# Patient Record
Sex: Female | Born: 1960 | Race: White | Hispanic: No | Smoking: Current every day smoker
Health system: Southern US, Community
[De-identification: ages and names within clinical notes are randomized; demographics above are authoritative.]

## PROBLEM LIST (undated history)

## (undated) DIAGNOSIS — M1990 Unspecified osteoarthritis, unspecified site: Secondary | ICD-10-CM

## (undated) DIAGNOSIS — I2510 Atherosclerotic heart disease of native coronary artery without angina pectoris: Secondary | ICD-10-CM

## (undated) DIAGNOSIS — R519 Headache, unspecified: Secondary | ICD-10-CM

## (undated) DIAGNOSIS — R51 Headache: Secondary | ICD-10-CM

## (undated) DIAGNOSIS — IMO0001 Reserved for inherently not codable concepts without codable children: Secondary | ICD-10-CM

## (undated) DIAGNOSIS — K219 Gastro-esophageal reflux disease without esophagitis: Secondary | ICD-10-CM

## (undated) DIAGNOSIS — I219 Acute myocardial infarction, unspecified: Secondary | ICD-10-CM

## (undated) DIAGNOSIS — F419 Anxiety disorder, unspecified: Secondary | ICD-10-CM

## (undated) DIAGNOSIS — J449 Chronic obstructive pulmonary disease, unspecified: Secondary | ICD-10-CM

## (undated) DIAGNOSIS — F329 Major depressive disorder, single episode, unspecified: Secondary | ICD-10-CM

## (undated) DIAGNOSIS — E785 Hyperlipidemia, unspecified: Secondary | ICD-10-CM

## (undated) DIAGNOSIS — I10 Essential (primary) hypertension: Secondary | ICD-10-CM

## (undated) DIAGNOSIS — G4730 Sleep apnea, unspecified: Secondary | ICD-10-CM

## (undated) DIAGNOSIS — F32A Depression, unspecified: Secondary | ICD-10-CM

## (undated) HISTORY — DX: Chronic obstructive pulmonary disease, unspecified: J44.9

## (undated) HISTORY — DX: Hyperlipidemia, unspecified: E78.5

## (undated) HISTORY — PX: INDUCED ABORTION: SHX677

## (undated) HISTORY — DX: Sleep apnea, unspecified: G47.30

## (undated) HISTORY — PX: TUBAL LIGATION: SHX77

## (undated) HISTORY — PX: CHOLECYSTECTOMY: SHX55

## (undated) HISTORY — PX: MULTIPLE TOOTH EXTRACTIONS: SHX2053

## (undated) HISTORY — DX: Unspecified osteoarthritis, unspecified site: M19.90

---

## 2009-07-20 ENCOUNTER — Emergency Department (HOSPITAL_COMMUNITY): Admission: EM | Admit: 2009-07-20 | Discharge: 2009-07-20 | Payer: Self-pay | Admitting: Emergency Medicine

## 2009-09-21 ENCOUNTER — Observation Stay (HOSPITAL_COMMUNITY): Admission: EM | Admit: 2009-09-21 | Discharge: 2009-09-27 | Payer: Self-pay | Admitting: Emergency Medicine

## 2009-09-21 ENCOUNTER — Encounter (INDEPENDENT_AMBULATORY_CARE_PROVIDER_SITE_OTHER): Payer: Self-pay | Admitting: Emergency Medicine

## 2009-09-24 ENCOUNTER — Encounter (INDEPENDENT_AMBULATORY_CARE_PROVIDER_SITE_OTHER): Payer: Self-pay | Admitting: Cardiology

## 2010-05-27 ENCOUNTER — Emergency Department (HOSPITAL_COMMUNITY)
Admission: EM | Admit: 2010-05-27 | Discharge: 2010-05-27 | Disposition: A | Discharge status: Home / Self Care | Payer: Medicaid Other | Attending: Emergency Medicine | Admitting: Emergency Medicine

## 2010-05-27 DIAGNOSIS — R197 Diarrhea, unspecified: Secondary | ICD-10-CM | POA: Insufficient documentation

## 2010-05-27 DIAGNOSIS — F411 Generalized anxiety disorder: Secondary | ICD-10-CM | POA: Insufficient documentation

## 2010-05-27 DIAGNOSIS — Z79899 Other long term (current) drug therapy: Secondary | ICD-10-CM | POA: Insufficient documentation

## 2010-05-27 DIAGNOSIS — R112 Nausea with vomiting, unspecified: Secondary | ICD-10-CM | POA: Insufficient documentation

## 2010-05-27 DIAGNOSIS — I252 Old myocardial infarction: Secondary | ICD-10-CM | POA: Insufficient documentation

## 2010-05-27 DIAGNOSIS — R5381 Other malaise: Secondary | ICD-10-CM | POA: Insufficient documentation

## 2010-05-27 DIAGNOSIS — R42 Dizziness and giddiness: Secondary | ICD-10-CM | POA: Insufficient documentation

## 2010-05-27 DIAGNOSIS — E860 Dehydration: Secondary | ICD-10-CM | POA: Insufficient documentation

## 2010-05-27 DIAGNOSIS — E789 Disorder of lipoprotein metabolism, unspecified: Secondary | ICD-10-CM | POA: Insufficient documentation

## 2010-05-27 LAB — DIFFERENTIAL
Basophils Absolute: 0.1 10*3/uL (ref 0.0–0.1)
Basophils Relative: 1 % (ref 0–1)
Eosinophils Absolute: 0.2 10*3/uL (ref 0.0–0.7)
Eosinophils Relative: 2 % (ref 0–5)
Lymphocytes Relative: 35 % (ref 12–46)
Lymphs Abs: 3.5 10*3/uL (ref 0.7–4.0)
Monocytes Absolute: 0.6 10*3/uL (ref 0.1–1.0)
Monocytes Relative: 6 % (ref 3–12)
Neutro Abs: 5.9 10*3/uL (ref 1.7–7.7)
Neutrophils Relative %: 58 % (ref 43–77)

## 2010-05-27 LAB — COMPREHENSIVE METABOLIC PANEL
ALT: <8 U/L (ref 0–35)
AST: 14 U/L (ref 0–37)
Albumin: 3.9 g/dL (ref 3.5–5.2)
Alkaline Phosphatase: 72 U/L (ref 39–117)
BUN: 44 mg/dL — ABNORMAL HIGH (ref 6–23)
CO2: 23 mEq/L (ref 19–32)
Calcium: 9.6 mg/dL (ref 8.4–10.5)
Chloride: 107 mEq/L (ref 96–112)
Creatinine, Ser: 1.39 mg/dL — ABNORMAL HIGH (ref 0.4–1.2)
GFR calc Af Amer: 49 mL/min — ABNORMAL LOW (ref >60)
GFR calc non Af Amer: 40 mL/min — ABNORMAL LOW (ref >60)
Glucose, Bld: 74 mg/dL (ref 70–99)
Potassium: 4.5 mEq/L (ref 3.5–5.1)
Sodium: 138 mEq/L (ref 135–145)
Total Bilirubin: 0.8 mg/dL (ref 0.3–1.2)
Total Protein: 7.7 g/dL (ref 6.0–8.3)

## 2010-05-27 LAB — LIPASE, BLOOD: Lipase: 25 U/L (ref 11–59)

## 2010-05-27 LAB — CBC
HCT: 42.7 % (ref 36.0–46.0)
Hemoglobin: 14.7 g/dL (ref 12.0–15.0)
MCH: 30 pg (ref 26.0–34.0)
MCHC: 34.4 g/dL (ref 30.0–36.0)
MCV: 87.1 fL (ref 78.0–100.0)
Platelets: 209 10*3/uL (ref 150–400)
RBC: 4.9 MIL/uL (ref 3.87–5.11)
RDW: 12.4 % (ref 11.5–15.5)
WBC: 10.2 10*3/uL (ref 4.0–10.5)

## 2010-07-08 LAB — DIFFERENTIAL
Basophils Absolute: 0 10*3/uL (ref 0.0–0.1)
Basophils Absolute: 0.1 10*3/uL (ref 0.0–0.1)
Basophils Relative: 1 % (ref 0–1)
Basophils Relative: 1 % (ref 0–1)
Eosinophils Absolute: 0.2 10*3/uL (ref 0.0–0.7)
Eosinophils Absolute: 0.2 10*3/uL (ref 0.0–0.7)
Eosinophils Relative: 3 % (ref 0–5)
Eosinophils Relative: 4 % (ref 0–5)
Lymphocytes Relative: 34 % (ref 12–46)
Lymphocytes Relative: 42 % (ref 12–46)
Lymphs Abs: 1.9 10*3/uL (ref 0.7–4.0)
Lymphs Abs: 3.5 10*3/uL (ref 0.7–4.0)
Monocytes Absolute: 0.4 10*3/uL (ref 0.1–1.0)
Monocytes Absolute: 0.7 10*3/uL (ref 0.1–1.0)
Monocytes Relative: 7 % (ref 3–12)
Monocytes Relative: 8 % (ref 3–12)
Neutro Abs: 3 10*3/uL (ref 1.7–7.7)
Neutro Abs: 3.9 10*3/uL (ref 1.7–7.7)
Neutrophils Relative %: 46 % (ref 43–77)
Neutrophils Relative %: 55 % (ref 43–77)

## 2010-07-08 LAB — URINE DRUGS OF ABUSE SCREEN W ALC, ROUTINE (REF LAB)
Amphetamine Screen, Ur: NEGATIVE
Barbiturate Quant, Ur: NEGATIVE
Benzodiazepines.: NEGATIVE
Cocaine Metabolites: NEGATIVE
Creatinine,U: 212.7 mg/dL
Ethyl Alcohol: <10 mg/dL (ref <10)
Marijuana Metabolite: NEGATIVE
Methadone: NEGATIVE
Opiate Screen, Urine: POSITIVE — AB
Phencyclidine (PCP): NEGATIVE
Propoxyphene: NEGATIVE

## 2010-07-08 LAB — CBC
HCT: 33.8 % — ABNORMAL LOW (ref 36.0–46.0)
HCT: 35.3 % — ABNORMAL LOW (ref 36.0–46.0)
HCT: 35.7 % — ABNORMAL LOW (ref 36.0–46.0)
HCT: 38 % (ref 36.0–46.0)
Hemoglobin: 11.9 g/dL — ABNORMAL LOW (ref 12.0–15.0)
Hemoglobin: 12.2 g/dL (ref 12.0–15.0)
Hemoglobin: 12.3 g/dL (ref 12.0–15.0)
Hemoglobin: 13 g/dL (ref 12.0–15.0)
MCHC: 34.1 g/dL (ref 30.0–36.0)
MCHC: 34.2 g/dL (ref 30.0–36.0)
MCHC: 34.9 g/dL (ref 30.0–36.0)
MCHC: 35.2 g/dL (ref 30.0–36.0)
MCV: 89.7 fL (ref 78.0–100.0)
MCV: 90.8 fL (ref 78.0–100.0)
MCV: 91.1 fL (ref 78.0–100.0)
MCV: 91.2 fL (ref 78.0–100.0)
Platelets: 134 10*3/uL — ABNORMAL LOW (ref 150–400)
Platelets: 137 10*3/uL — ABNORMAL LOW (ref 150–400)
Platelets: 149 10*3/uL — ABNORMAL LOW (ref 150–400)
Platelets: 166 10*3/uL (ref 150–400)
RBC: 3.77 MIL/uL — ABNORMAL LOW (ref 3.87–5.11)
RBC: 3.88 MIL/uL (ref 3.87–5.11)
RBC: 3.91 MIL/uL (ref 3.87–5.11)
RBC: 4.18 MIL/uL (ref 3.87–5.11)
RDW: 13.2 % (ref 11.5–15.5)
RDW: 13.2 % (ref 11.5–15.5)
RDW: 13.3 % (ref 11.5–15.5)
RDW: 13.4 % (ref 11.5–15.5)
WBC: 5.5 10*3/uL (ref 4.0–10.5)
WBC: 6.1 10*3/uL (ref 4.0–10.5)
WBC: 6.1 10*3/uL (ref 4.0–10.5)
WBC: 8.4 10*3/uL (ref 4.0–10.5)

## 2010-07-08 LAB — BASIC METABOLIC PANEL
BUN: 11 mg/dL (ref 6–23)
BUN: 11 mg/dL (ref 6–23)
BUN: 12 mg/dL (ref 6–23)
CO2: 22 mEq/L (ref 19–32)
CO2: 26 mEq/L (ref 19–32)
CO2: 29 mEq/L (ref 19–32)
Calcium: 8.3 mg/dL — ABNORMAL LOW (ref 8.4–10.5)
Calcium: 8.4 mg/dL (ref 8.4–10.5)
Calcium: 9.1 mg/dL (ref 8.4–10.5)
Chloride: 107 mEq/L (ref 96–112)
Chloride: 109 mEq/L (ref 96–112)
Chloride: 110 mEq/L (ref 96–112)
Creatinine, Ser: 0.73 mg/dL (ref 0.4–1.2)
Creatinine, Ser: 0.75 mg/dL (ref 0.4–1.2)
Creatinine, Ser: 0.83 mg/dL (ref 0.4–1.2)
GFR calc Af Amer: >60 mL/min (ref >60)
GFR calc Af Amer: >60 mL/min (ref >60)
GFR calc Af Amer: >60 mL/min (ref >60)
GFR calc non Af Amer: >60 mL/min (ref >60)
GFR calc non Af Amer: >60 mL/min (ref >60)
GFR calc non Af Amer: >60 mL/min (ref >60)
Glucose, Bld: 85 mg/dL (ref 70–99)
Glucose, Bld: 93 mg/dL (ref 70–99)
Glucose, Bld: 94 mg/dL (ref 70–99)
Potassium: 4 mEq/L (ref 3.5–5.1)
Potassium: 4 mEq/L (ref 3.5–5.1)
Potassium: 4.1 mEq/L (ref 3.5–5.1)
Sodium: 138 mEq/L (ref 135–145)
Sodium: 140 mEq/L (ref 135–145)
Sodium: 140 mEq/L (ref 135–145)

## 2010-07-08 LAB — COMPREHENSIVE METABOLIC PANEL
ALT: 31 U/L (ref 0–35)
AST: 34 U/L (ref 0–37)
Albumin: 3 g/dL — ABNORMAL LOW (ref 3.5–5.2)
Alkaline Phosphatase: 91 U/L (ref 39–117)
BUN: 12 mg/dL (ref 6–23)
CO2: 27 mEq/L (ref 19–32)
Calcium: 7.9 mg/dL — ABNORMAL LOW (ref 8.4–10.5)
Chloride: 108 mEq/L (ref 96–112)
Creatinine, Ser: 0.73 mg/dL (ref 0.4–1.2)
GFR calc Af Amer: >60 mL/min (ref >60)
GFR calc non Af Amer: >60 mL/min (ref >60)
Glucose, Bld: 98 mg/dL (ref 70–99)
Potassium: 3.7 mEq/L (ref 3.5–5.1)
Sodium: 138 mEq/L (ref 135–145)
Total Bilirubin: 0.2 mg/dL — ABNORMAL LOW (ref 0.3–1.2)
Total Protein: 5.9 g/dL — ABNORMAL LOW (ref 6.0–8.3)

## 2010-07-08 LAB — URINE CULTURE: Colony Count: >=100000

## 2010-07-08 LAB — OPIATE, QUANTITATIVE, URINE
Codeine Urine: NEGATIVE NG/ML
Hydrocodone: 6355 NG/ML — ABNORMAL HIGH
Hydromorphone GC/MS Conf: 568 NG/ML — ABNORMAL HIGH
Morphine, Confirm: 2962 NG/ML — ABNORMAL HIGH
Oxycodone, ur: NEGATIVE NG/ML
Oxymorphone: NEGATIVE NG/ML

## 2010-07-08 LAB — POCT CARDIAC MARKERS
CKMB, poc: <1 ng/mL — ABNORMAL LOW (ref 1.0–8.0)
CKMB, poc: <1 ng/mL — ABNORMAL LOW (ref 1.0–8.0)
Myoglobin, poc: 39.6 ng/mL (ref 12–200)
Myoglobin, poc: 45.8 ng/mL (ref 12–200)
Troponin i, poc: <0.05 ng/mL (ref 0.00–0.09)
Troponin i, poc: <0.05 ng/mL (ref 0.00–0.09)

## 2010-07-08 LAB — URINALYSIS, ROUTINE W REFLEX MICROSCOPIC
Bilirubin Urine: NEGATIVE
Glucose, UA: NEGATIVE mg/dL
Ketones, ur: NEGATIVE mg/dL
Nitrite: POSITIVE — AB
Protein, ur: 30 mg/dL — AB
Specific Gravity, Urine: 1.007 (ref 1.005–1.030)
Urobilinogen, UA: 1 mg/dL (ref 0.0–1.0)
pH: 6 (ref 5.0–8.0)

## 2010-07-08 LAB — RAPID URINE DRUG SCREEN, HOSP PERFORMED
Amphetamines: NOT DETECTED
Barbiturates: NOT DETECTED
Benzodiazepines: NOT DETECTED
Cocaine: NOT DETECTED
Opiates: POSITIVE — AB
Tetrahydrocannabinol: NOT DETECTED

## 2010-07-08 LAB — URINE MICROSCOPIC-ADD ON

## 2010-07-08 LAB — CULTURE, BLOOD (ROUTINE X 2)
Culture: NO GROWTH
Culture: NO GROWTH

## 2010-07-08 LAB — CK TOTAL AND CKMB (NOT AT ARMC)
CK, MB: 0.5 ng/mL (ref 0.3–4.0)
Relative Index: INVALID (ref 0.0–2.5)
Total CK: 37 U/L (ref 7–177)

## 2010-07-08 LAB — HEMOGLOBIN A1C
Hgb A1c MFr Bld: 5.3 % (ref <5.7)
Mean Plasma Glucose: 105 mg/dL (ref <117)

## 2010-07-08 LAB — CARDIAC PANEL(CRET KIN+CKTOT+MB+TROPI)
CK, MB: 0.5 ng/mL (ref 0.3–4.0)
CK, MB: 0.5 ng/mL (ref 0.3–4.0)
Relative Index: INVALID (ref 0.0–2.5)
Relative Index: INVALID (ref 0.0–2.5)
Total CK: 28 U/L (ref 7–177)
Total CK: 31 U/L (ref 7–177)
Troponin I: 0.01 ng/mL (ref 0.00–0.06)
Troponin I: 0.01 ng/mL (ref 0.00–0.06)

## 2010-07-08 LAB — LIPID PANEL
Cholesterol: 122 mg/dL (ref 0–200)
HDL: 30 mg/dL — ABNORMAL LOW (ref >39)
LDL Cholesterol: 54 mg/dL (ref 0–99)
Total CHOL/HDL Ratio: 4.1 RATIO
Triglycerides: 188 mg/dL — ABNORMAL HIGH (ref <150)
VLDL: 38 mg/dL (ref 0–40)

## 2010-07-08 LAB — TROPONIN I: Troponin I: <0.01 ng/mL (ref 0.00–0.06)

## 2010-07-08 LAB — TSH: TSH: 3.114 u[IU]/mL (ref 0.350–4.500)

## 2010-07-08 LAB — SEDIMENTATION RATE: Sed Rate: 70 mm/hr — ABNORMAL HIGH (ref 0–22)

## 2011-01-29 ENCOUNTER — Emergency Department (HOSPITAL_COMMUNITY)
Admission: EM | Admit: 2011-01-29 | Discharge: 2011-01-29 | Disposition: A | Discharge status: Home / Self Care | Payer: Medicaid Other | Attending: Emergency Medicine | Admitting: Emergency Medicine

## 2011-01-29 DIAGNOSIS — M5430 Sciatica, unspecified side: Secondary | ICD-10-CM | POA: Insufficient documentation

## 2011-01-29 DIAGNOSIS — G8929 Other chronic pain: Secondary | ICD-10-CM | POA: Insufficient documentation

## 2011-01-29 DIAGNOSIS — I252 Old myocardial infarction: Secondary | ICD-10-CM | POA: Insufficient documentation

## 2011-01-29 DIAGNOSIS — M545 Low back pain, unspecified: Secondary | ICD-10-CM | POA: Insufficient documentation

## 2011-01-29 DIAGNOSIS — E7800 Pure hypercholesterolemia, unspecified: Secondary | ICD-10-CM | POA: Insufficient documentation

## 2015-06-15 ENCOUNTER — Emergency Department
Admission: EM | Admit: 2015-06-15 | Discharge: 2015-06-15 | Disposition: A | Discharge status: Home / Self Care | Payer: Medicaid Other | Attending: Student | Admitting: Student

## 2015-06-15 DIAGNOSIS — K0889 Other specified disorders of teeth and supporting structures: Secondary | ICD-10-CM | POA: Diagnosis not present

## 2015-06-15 DIAGNOSIS — F17200 Nicotine dependence, unspecified, uncomplicated: Secondary | ICD-10-CM | POA: Diagnosis not present

## 2015-06-15 DIAGNOSIS — K08409 Partial loss of teeth, unspecified cause, unspecified class: Secondary | ICD-10-CM | POA: Diagnosis not present

## 2015-06-15 DIAGNOSIS — I10 Essential (primary) hypertension: Secondary | ICD-10-CM | POA: Diagnosis not present

## 2015-06-15 HISTORY — DX: Atherosclerotic heart disease of native coronary artery without angina pectoris: I25.10

## 2015-06-15 HISTORY — DX: Essential (primary) hypertension: I10

## 2015-06-15 HISTORY — DX: Acute myocardial infarction, unspecified: I21.9

## 2015-06-15 MED ORDER — IBUPROFEN 600 MG PO TABS: 600.0000 mg | ORAL_TABLET | Freq: Four times a day (QID) | ORAL | Status: DC | PRN

## 2015-06-15 MED ORDER — PENICILLIN V POTASSIUM 500 MG PO TABS: 500.0000 mg | ORAL_TABLET | Freq: Four times a day (QID) | ORAL | Status: DC

## 2015-06-15 MED ORDER — OXYCODONE HCL 5 MG PO TABS
ORAL_TABLET | ORAL | Status: AC
  Administered 2015-06-15: 10 mg via ORAL
  Filled 2015-06-15: qty 2

## 2015-06-15 MED ORDER — OXYCODONE HCL 5 MG PO TABS
10.0000 mg | ORAL_TABLET | Freq: Once | ORAL | Status: AC
  Administered 2015-06-15: 10 mg via ORAL

## 2015-06-15 NOTE — ED Provider Notes (Signed)
[facility] Emergency Department Provider Note  ____________________________________________  Time seen: Approximately 7:11 AM  I have reviewed the triage vital signs and the nursing notes.   HISTORY  Chief Complaint Dental Pain    HPI Natalie Taylor is a 55 y.o. female with history of hypertension, coronary artery disease who presents for evaluation of dental pain which began 2 days ago, gradual onset, constant since onset, currently moderate to severe, worse with eating. The patient reports that she had to left mandibular teeth extracted 2 days ago and has had pain since then. She is taken a total of #10 hydrocodone with no relief. No fevers or chills, no vomiting, or diarrhea, no chest pain or difficulty breathing. She is worried she may have a dry socket. No headache, vision change, numbness or weakness.   Past Medical History  Diagnosis Date  . Hypertension   . Coronary artery disease   . MI (myocardial infarction) (HCC)     x 2    There are no active problems to display for this patient.   Past Surgical History  Procedure Laterality Date  . Cardiac stents      No current outpatient prescriptions on file.  Allergies Review of patient's allergies indicates no known allergies.  No family history on file.  Social History Social History  Substance Use Topics  . Smoking status: Current Every Day Smoker  . Smokeless tobacco: None  . Alcohol Use: No    Review of Systems Constitutional: No fever/chills Eyes: No visual changes. ENT: No sore throat. Cardiovascular: Denies chest pain. Respiratory: Denies shortness of breath. Gastrointestinal: No abdominal pain.  No nausea, no vomiting.  No diarrhea.  No constipation. Genitourinary: Negative for dysuria. Musculoskeletal: Negative for back pain. Skin: Negative for rash. Neurological: Negative for headaches, focal weakness or numbness.  10-point ROS otherwise  negative.  ____________________________________________   PHYSICAL EXAM:  VITAL SIGNS: ED Triage Vitals  Enc Vitals Group     BP 06/15/15 0551 199/98 mmHg     Pulse Rate 06/15/15 0551 66     Resp 06/15/15 0551 18     Temp 06/15/15 0551 97.6 F (36.4 C)     Temp Source 06/15/15 0551 Oral     SpO2 06/15/15 0551 98 %     Weight 06/15/15 0551 140 lb (63.504 kg)     Height 06/15/15 0551  (1.676 m)     Head Cir --      Peak Flow --      Pain Score 06/15/15 0603 10     Pain Loc --      Pain Edu? --      Excl. in GC? --     Constitutional: Alert and oriented. Well appearing and in no acute distress. Eyes: Conjunctivae are normal. PERRL. EOMI. Head: Atraumatic. Nose: No congestion/rhinnorhea. Mouth/Throat: Mucous membranes are moist.  Oropharynx non-erythematous. The patient has chronically poor dentition throughout the mouth. She has few mandibular teeth, there are 2 left mandibular tooth sockets which appear to be teeth 21 and 20 status post recent tooth extraction with no purulent drainage, mild periGingival inflammation. Neck: No stridor.   Cardiovascular: Normal rate, regular rhythm. Grossly normal heart sounds.  Good peripheral circulation. Respiratory: Normal respiratory effort.  No retractions. Lungs CTAB. Gastrointestinal: Soft and nontender. No distention.  No CVA tenderness. Genitourinary: deferred Musculoskeletal: No lower extremity tenderness nor edema.  No joint effusions. Neurologic:  Normal speech and language. No gross focal neurologic deficits are appreciated. No gait  instability. Skin:  Skin is warm, dry and intact. No rash noted. Psychiatric: Mood and affect are normal. Speech and behavior are normal.  ____________________________________________   LABS (all labs ordered are listed, but only abnormal results are displayed)  Labs Reviewed - No data to  display ____________________________________________  EKG  none ____________________________________________  RADIOLOGY  none ____________________________________________   PROCEDURES  Procedure(s) performed: None  Critical Care performed: No  ____________________________________________   INITIAL IMPRESSION / ASSESSMENT AND PLAN / ED COURSE  Pertinent labs & imaging results that were available during my care of the patient were reviewed by me and considered in my medical decision making (see chart for details).  Natalie Taylor is a 55 y.o. female with history of hypertension, coronary artery disease who presents for evaluation of dental pain which began 2 days ago. On exam, she is well-appearing and in no acute distress. Vital signs are stable, she is afebrile. He does have hypertension with blood pressure 199/98 however she is asymptomatic, I have encouraged her to take her home antihypertensive medications and I suspect this may be partly secondary to pain. She does have 2 dental sockets status post tooth extraction with some mild surrounding inflammation, no obvious abscess. I discussed with her that she needs to follow-up quickly with her dentist for possible treatment of dry sockets. We'll discharge with anti-inflammatory medications as well as prophylactic penicillin. Discussed return precautions and she is comfortable with the discharge plan. I've advised her to follow-up with her primary care doctor in 1 week for reevaluation of her HTN. ____________________________________________   FINAL CLINICAL IMPRESSION(S) / ED DIAGNOSES  Final diagnoses:  Pain, dental      Gayla Doss, MD 06/15/15 [phone]

## 2015-06-15 NOTE — ED Notes (Signed)
Patient had a couple of teeth extracted on 2/21 and is wondering if she doesn't have dry sockets. Two left lower teeth were extracted and presents tonight due to pain.

## 2015-06-24 ENCOUNTER — Emergency Department
Admission: EM | Admit: 2015-06-24 | Discharge: 2015-06-24 | Disposition: A | Discharge status: Home / Self Care | Payer: Medicaid Other | Attending: Emergency Medicine | Admitting: Emergency Medicine

## 2015-06-24 ENCOUNTER — Emergency Department: Payer: Medicaid Other

## 2015-06-24 DIAGNOSIS — K5900 Constipation, unspecified: Secondary | ICD-10-CM | POA: Diagnosis not present

## 2015-06-24 DIAGNOSIS — Z3202 Encounter for pregnancy test, result negative: Secondary | ICD-10-CM | POA: Diagnosis not present

## 2015-06-24 DIAGNOSIS — I10 Essential (primary) hypertension: Secondary | ICD-10-CM | POA: Diagnosis not present

## 2015-06-24 DIAGNOSIS — F17200 Nicotine dependence, unspecified, uncomplicated: Secondary | ICD-10-CM | POA: Insufficient documentation

## 2015-06-24 DIAGNOSIS — Z792 Long term (current) use of antibiotics: Secondary | ICD-10-CM | POA: Insufficient documentation

## 2015-06-24 LAB — POCT PREGNANCY, URINE: Preg Test, Ur: NEGATIVE

## 2015-06-24 MED ORDER — GLYCERIN (LAXATIVE) 2.1 G RE SUPP
1.0000 | Freq: Once | RECTAL | Status: AC
  Administered 2015-06-24: 1 via RECTAL
  Filled 2015-06-24: qty 1

## 2015-06-24 NOTE — Discharge Instructions (Signed)
Constipation, Adult Constipation is when a person has fewer than three bowel movements a week, has difficulty having a bowel movement, or has stools that are dry, hard, or larger than normal. As people grow older, constipation is more common. A low-fiber diet, not taking in enough fluids, and taking certain medicines may make constipation worse.  CAUSES   Certain medicines, such as antidepressants, pain medicine, iron supplements, antacids, and water pills.   Certain diseases, such as diabetes, irritable bowel syndrome (IBS), thyroid disease, or depression.   Not drinking enough water.   Not eating enough fiber-rich foods.   Stress or travel.   Lack of physical activity or exercise.   Ignoring the urge to have a bowel movement.   Using laxatives too much.  SIGNS AND SYMPTOMS   Having fewer than three bowel movements a week.   Straining to have a bowel movement.   Having stools that are hard, dry, or larger than normal.   Feeling full or bloated.   Pain in the lower abdomen.   Not feeling relief after having a bowel movement.  DIAGNOSIS  Your health care provider will take a medical history and perform a physical exam. Further testing may be done for severe constipation. Some tests may include:  A barium enema X-ray to examine your rectum, colon, and, sometimes, your small intestine.   A sigmoidoscopy to examine your lower colon.   A colonoscopy to examine your entire colon. TREATMENT  Treatment will depend on the severity of your constipation and what is causing it. Some dietary treatments include drinking more fluids and eating more fiber-rich foods. Lifestyle treatments may include regular exercise. If these diet and lifestyle recommendations do not help, your health care provider may recommend taking over-the-counter laxative medicines to help you have bowel movements. Prescription medicines may be prescribed if over-the-counter medicines do not work.    HOME CARE INSTRUCTIONS   Eat foods that have a lot of fiber, such as fruits, vegetables, whole grains, and beans.  Limit foods high in fat and processed sugars, such as french fries, hamburgers, cookies, candies, and soda.   A fiber supplement may be added to your diet if you cannot get enough fiber from foods.   Drink enough fluids to keep your urine clear or pale yellow.   Exercise regularly or as directed by your health care provider.   Go to the restroom when you have the urge to go. Do not hold it.   Only take over-the-counter or prescription medicines as directed by your health care provider. Do not take other medicines for constipation without talking to your health care provider first.  SEEK IMMEDIATE MEDICAL CARE IF:   You have bright red blood in your stool.   Your constipation lasts for more than 4 days or gets worse.   You have abdominal or rectal pain.   You have thin, pencil-like stools.   You have unexplained weight loss. MAKE SURE YOU:   Understand these instructions.  Will watch your condition.  Will get help right away if you are not doing well or get worse.   This information is not intended to replace advice given to you by your health care provider. Make sure you discuss any questions you have with your health care provider.   Document Released: 01/04/2004 Document Revised: 04/28/2014 Document Reviewed: 01/17/2013 Elsevier Interactive Patient Education 2016 [employer].     Constipation - defined medically as fewer than three stools per week and severe constipation as less  than one stool per week.   Colace (docusate) - Pick up an over-the-counter form of Colace or another stool softener and take twice a day as long as you are requiring postoperative pain medications.  Take with a full glass of water daily.  If you experience loose stools or diarrhea, hold the colace until you stool forms back up.  If your symptoms do not get better  within 1 week or if they get worse, check with your doctor.  Dulcolax (bisacodyl) - Pick up over-the-counter and take as directed by the product packaging as needed to assist with the movement of your bowels.  Take with a full glass of water.  Use this product as needed if not relieved by Colace only.   MiraLax (polyethylene glycol) - Pick up over-the-counter to have on hand.  MiraLax is a solution that will increase the amount of water in your bowels to assist with bowel movements.  Take as directed and can mix with a glass of water, juice, soda, coffee, or tea.  Take if you go more than two days without a movement. Do not use MiraLax more than once per day. Call your doctor if you are still constipated or irregular after using this medication for 7 days in a row.

## 2015-06-24 NOTE — ED Provider Notes (Signed)
CSN: [id number]     Arrival date & time 06/24/15  1432 History   First MD Initiated Contact with Patient 06/24/15 1627     Chief Complaint  Patient presents with  . Constipation     (Consider location/radiation/quality/duration/timing/severity/associated sxs/prior Treatment) HPI  55 year old female presents to the emergency department for evaluation of constipation. Patient states she has not had a bowel movement in 2 weeks. She regularly goes once a week. Today, she states she has felt bloated with cramping in her lower back. At times, pain can be 7 out of 10. She denies any nausea or vomiting and continues to tolerate by mouth well. She denies taking any over-the-counter laxatives or stool softeners. She denies any abdominal surgery or history of obstruction.  Past Medical History  Diagnosis Date  . Hypertension   . Coronary artery disease   . MI (myocardial infarction) (HCC)     x 2   Past Surgical History  Procedure Laterality Date  . Cardiac stents     No family history on file. Social History  Substance Use Topics  . Smoking status: Current Every Day Smoker  . Smokeless tobacco: Not on file  . Alcohol Use: No   OB History    No data available     Review of Systems  Constitutional: Negative for fever, chills, activity change and fatigue.  HENT: Negative for congestion, sinus pressure and sore throat.   Eyes: Negative for visual disturbance.  Respiratory: Negative for cough, chest tightness and shortness of breath.   Cardiovascular: Negative for chest pain and leg swelling.  Gastrointestinal: Positive for constipation. Negative for nausea, vomiting, abdominal pain and diarrhea.  Genitourinary: Negative for dysuria.  Musculoskeletal: Negative for arthralgias and gait problem.  Skin: Negative for rash.  Neurological: Negative for weakness, numbness and headaches.  Hematological: Negative for adenopathy.  Psychiatric/Behavioral: Negative for behavioral problems,  confusion and agitation.      Allergies  Review of patient's allergies indicates no known allergies.  Home Medications   Prior to Admission medications   Medication Sig Start Date End Date Taking? Authorizing Provider  ibuprofen (ADVIL,MOTRIN) 600 MG tablet Take 1 tablet (600 mg total) by mouth every 6 (six) hours as needed for moderate pain (Take only if you are sure you're not pregnant.). 06/15/15   Gayla Doss, MD  penicillin v potassium (VEETID) 500 MG tablet Take 1 tablet (500 mg total) by mouth 4 (four) times daily. 06/15/15   Gayla Doss, MD   BP 169/79 mmHg  Pulse 70  Temp(Src) 98.2 F (36.8 C)  Resp 18  SpO2 97%  LMP 06/08/2015 Physical Exam  Constitutional: She is oriented to person, place, and time. She appears well-developed and well-nourished. No distress.  HENT:  Head: Normocephalic and atraumatic.  Mouth/Throat: Oropharynx is clear and moist.  Eyes: EOM are normal. Pupils are equal, round, and reactive to light. Right eye exhibits no discharge. Left eye exhibits no discharge.  Neck: Normal range of motion. Neck supple.  Cardiovascular: Normal rate, regular rhythm and intact distal pulses.   Pulmonary/Chest: Effort normal and breath sounds normal. No respiratory distress. She exhibits no tenderness.  Abdominal: Soft. Bowel sounds are normal. She exhibits no distension and no mass. There is no tenderness. There is no rebound and no guarding.  Musculoskeletal: Normal range of motion. She exhibits no edema.  Neurological: She is alert and oriented to person, place, and time. She has normal reflexes.  Skin: Skin is warm and dry.  Psychiatric: She  has a normal mood and affect. Her behavior is normal. Thought content normal.    ED Course  Procedures (including critical care time) Labs Review Labs Reviewed  POCT PREGNANCY, URINE    Imaging Review Dg Abd 1 View  06/24/2015  CLINICAL DATA:  Constipation for 2 weeks. EXAM: ABDOMEN - 1 VIEW COMPARISON:  None.  FINDINGS: No evidence of dilated bowel loops. Moderate stool burden seen in the ascending and transverse colon. Right upper quadrant surgical clips seen from prior cholecystectomy. Clips also seen in the pelvis from previous tubal ligation surgery. IMPRESSION: No acute findings.  Moderate colonic stool burden. Electronically Signed   By: Myles RosenthalJohn  Stahl M.D.   On: 06/24/2015 18:48   I have personally reviewed and evaluated these images and lab results as part of my medical decision-making.   EKG Interpretation None      MDM   Final diagnoses:  Constipation, unspecified constipation type    55 year old female with acute on chronic constipation. Abdomen is soft and nontender. She tolerates by mouth well with no nausea or vomiting. Abdominal x-ray shows moderate stool burden within the colon. Patient is educated on a stool regimen as well as signs and symptoms return to the ED for. Recommend daily MiraLAX, docusate, Colace. Purchase these over-the-counter. She will also call tomorrow to schedule an appointment with GI to discuss a permanent stool regimen. Patient also increase fluids, fiber and exercise.  Evon Slackhomas C Oak Dorey, PA-C 06/24/15 1918  Sharman CheekPhillip Stafford, MD 06/24/15 2251

## 2015-06-24 NOTE — ED Notes (Signed)
Pt reports not having a regular "BM" for almost 2 weeks. Pt did not take any laxative at home

## 2015-06-28 ENCOUNTER — Ambulatory Visit: Payer: Self-pay | Admitting: Family Medicine

## 2015-07-02 ENCOUNTER — Encounter: Payer: Self-pay | Admitting: Family Medicine

## 2015-07-02 ENCOUNTER — Ambulatory Visit: Payer: Self-pay | Admitting: Family Medicine

## 2015-09-27 ENCOUNTER — Encounter
Admission: EM | Disposition: A | Discharge status: Home / Self Care | Payer: Self-pay | Source: Home / Self Care | Attending: Internal Medicine

## 2015-09-27 ENCOUNTER — Inpatient Hospital Stay
Admission: EM | Admit: 2015-09-27 | Discharge: 2015-09-29 | DRG: 246 | Disposition: A | Discharge status: Home / Self Care | Payer: Medicaid Other | Attending: Internal Medicine | Admitting: Internal Medicine

## 2015-09-27 ENCOUNTER — Emergency Department: Payer: Medicaid Other

## 2015-09-27 DIAGNOSIS — E785 Hyperlipidemia, unspecified: Secondary | ICD-10-CM | POA: Diagnosis present

## 2015-09-27 DIAGNOSIS — F17210 Nicotine dependence, cigarettes, uncomplicated: Secondary | ICD-10-CM | POA: Diagnosis present

## 2015-09-27 DIAGNOSIS — G4730 Sleep apnea, unspecified: Secondary | ICD-10-CM | POA: Diagnosis present

## 2015-09-27 DIAGNOSIS — I10 Essential (primary) hypertension: Secondary | ICD-10-CM | POA: Diagnosis present

## 2015-09-27 DIAGNOSIS — J449 Chronic obstructive pulmonary disease, unspecified: Secondary | ICD-10-CM | POA: Diagnosis present

## 2015-09-27 DIAGNOSIS — I2111 ST elevation (STEMI) myocardial infarction involving right coronary artery: Secondary | ICD-10-CM | POA: Diagnosis present

## 2015-09-27 DIAGNOSIS — Y838 Other surgical procedures as the cause of abnormal reaction of the patient, or of later complication, without mention of misadventure at the time of the procedure: Secondary | ICD-10-CM | POA: Diagnosis present

## 2015-09-27 DIAGNOSIS — Z951 Presence of aortocoronary bypass graft: Secondary | ICD-10-CM

## 2015-09-27 DIAGNOSIS — M1990 Unspecified osteoarthritis, unspecified site: Secondary | ICD-10-CM | POA: Diagnosis present

## 2015-09-27 DIAGNOSIS — I213 ST elevation (STEMI) myocardial infarction of unspecified site: Secondary | ICD-10-CM

## 2015-09-27 DIAGNOSIS — T82855A Stenosis of coronary artery stent, initial encounter: Secondary | ICD-10-CM | POA: Diagnosis present

## 2015-09-27 DIAGNOSIS — I2510 Atherosclerotic heart disease of native coronary artery without angina pectoris: Secondary | ICD-10-CM | POA: Diagnosis present

## 2015-09-27 DIAGNOSIS — I252 Old myocardial infarction: Secondary | ICD-10-CM | POA: Diagnosis present

## 2015-09-27 DIAGNOSIS — Z9114 Patient's other noncompliance with medication regimen: Secondary | ICD-10-CM

## 2015-09-27 HISTORY — PX: OTHER SURGICAL HISTORY: SHX169

## 2015-09-27 HISTORY — PX: CARDIAC CATHETERIZATION: SHX172

## 2015-09-27 LAB — COMPREHENSIVE METABOLIC PANEL
ALT: 6 U/L — ABNORMAL LOW (ref 14–54)
AST: 11 U/L — ABNORMAL LOW (ref 15–41)
Albumin: 3.4 g/dL — ABNORMAL LOW (ref 3.5–5.0)
Alkaline Phosphatase: 80 U/L (ref 38–126)
Anion gap: 7 (ref 5–15)
BUN: 13 mg/dL (ref 6–20)
CO2: 21 mmol/L — ABNORMAL LOW (ref 22–32)
Calcium: 7.9 mg/dL — ABNORMAL LOW (ref 8.9–10.3)
Chloride: 111 mmol/L (ref 101–111)
Creatinine, Ser: 0.87 mg/dL (ref 0.44–1.00)
GFR calc Af Amer: >60 mL/min (ref >60)
GFR calc non Af Amer: >60 mL/min (ref >60)
Glucose, Bld: 150 mg/dL — ABNORMAL HIGH (ref 65–99)
Potassium: 3.1 mmol/L — ABNORMAL LOW (ref 3.5–5.1)
Sodium: 139 mmol/L (ref 135–145)
Total Bilirubin: 0.2 mg/dL — ABNORMAL LOW (ref 0.3–1.2)
Total Protein: 6.8 g/dL (ref 6.5–8.1)

## 2015-09-27 LAB — DIFFERENTIAL
Basophils Absolute: 0.1 10*3/uL (ref 0–0.1)
Basophils Relative: 1 %
Eosinophils Absolute: 0.3 10*3/uL (ref 0–0.7)
Eosinophils Relative: 2 %
Lymphocytes Relative: 17 %
Lymphs Abs: 2.4 10*3/uL (ref 1.0–3.6)
Monocytes Absolute: 0.9 10*3/uL (ref 0.2–0.9)
Monocytes Relative: 7 %
Neutro Abs: 10.5 10*3/uL — ABNORMAL HIGH (ref 1.4–6.5)
Neutrophils Relative %: 73 %

## 2015-09-27 LAB — CBC
HCT: 39.9 % (ref 35.0–47.0)
Hemoglobin: 13.2 g/dL (ref 12.0–16.0)
MCH: 29 pg (ref 26.0–34.0)
MCHC: 33 g/dL (ref 32.0–36.0)
MCV: 87.9 fL (ref 80.0–100.0)
Platelets: 182 10*3/uL (ref 150–440)
RBC: 4.54 MIL/uL (ref 3.80–5.20)
RDW: 14.3 % (ref 11.5–14.5)
WBC: 14.2 10*3/uL — ABNORMAL HIGH (ref 3.6–11.0)

## 2015-09-27 LAB — LIPID PANEL
Cholesterol: 152 mg/dL (ref 0–200)
HDL: 31 mg/dL — ABNORMAL LOW (ref >40)
LDL Cholesterol: 103 mg/dL — ABNORMAL HIGH (ref 0–99)
Total CHOL/HDL Ratio: 4.9 RATIO
Triglycerides: 90 mg/dL (ref <150)
VLDL: 18 mg/dL (ref 0–40)

## 2015-09-27 LAB — APTT: aPTT: 27 seconds (ref 24–36)

## 2015-09-27 LAB — PROTIME-INR
INR: 1.03
Prothrombin Time: 13.7 seconds (ref 11.4–15.0)

## 2015-09-27 LAB — TROPONIN I
Troponin I: 0.05 ng/mL — ABNORMAL HIGH (ref <0.031)
Troponin I: 9.29 ng/mL — ABNORMAL HIGH (ref <0.031)

## 2015-09-27 LAB — MRSA PCR SCREENING: MRSA by PCR: NEGATIVE

## 2015-09-27 LAB — ETHANOL: Alcohol, Ethyl (B): <5 mg/dL (ref <5)

## 2015-09-27 SURGERY — LEFT HEART CATH AND CORONARY ANGIOGRAPHY
Anesthesia: Moderate Sedation

## 2015-09-27 MED ORDER — TICAGRELOR 90 MG PO TABS
ORAL_TABLET | ORAL | Status: DC | PRN
  Administered 2015-09-27: 180 mg via ORAL

## 2015-09-27 MED ORDER — TICAGRELOR 90 MG PO TABS
90.0000 mg | ORAL_TABLET | Freq: Two times a day (BID) | ORAL | Status: DC
  Administered 2015-09-27 – 2015-09-29 (×4): 90 mg via ORAL
  Filled 2015-09-27 (×4): qty 1

## 2015-09-27 MED ORDER — MIDAZOLAM HCL 2 MG/2ML IJ SOLN
INTRAMUSCULAR | Status: DC | PRN
  Administered 2015-09-27 (×2): 1 mg via INTRAVENOUS

## 2015-09-27 MED ORDER — BIVALIRUDIN 250 MG IV SOLR
0.2500 mg/kg/h | INTRAVENOUS | Status: AC
  Filled 2015-09-27: qty 250

## 2015-09-27 MED ORDER — FENTANYL CITRATE (PF) 100 MCG/2ML IJ SOLN
INTRAMUSCULAR | Status: DC | PRN
  Administered 2015-09-27: 25 ug via INTRAVENOUS
  Administered 2015-09-27: 50 ug via INTRAVENOUS
  Administered 2015-09-27: 25 ug via INTRAVENOUS

## 2015-09-27 MED ORDER — ACETAMINOPHEN 325 MG PO TABS
650.0000 mg | ORAL_TABLET | ORAL | Status: DC | PRN
  Administered 2015-09-27 – 2015-09-29 (×5): 650 mg via ORAL
  Filled 2015-09-27 (×5): qty 2

## 2015-09-27 MED ORDER — MORPHINE SULFATE (PF) 4 MG/ML IV SOLN
4.0000 mg | Freq: Once | INTRAVENOUS | Status: DC
  Filled 2015-09-27: qty 1

## 2015-09-27 MED ORDER — SODIUM CHLORIDE 0.9 % WEIGHT BASED INFUSION
3.0000 mL/kg/h | INTRAVENOUS | Status: DC
  Administered 2015-09-27: 3 mL/kg/h via INTRAVENOUS

## 2015-09-27 MED ORDER — TICAGRELOR 90 MG PO TABS
ORAL_TABLET | ORAL | Status: AC
  Filled 2015-09-27: qty 2

## 2015-09-27 MED ORDER — BIVALIRUDIN BOLUS VIA INFUSION - CUPID
INTRAVENOUS | Status: DC | PRN
  Administered 2015-09-27: 45.225 mg via INTRAVENOUS

## 2015-09-27 MED ORDER — NICOTINE 21 MG/24HR TD PT24
21.0000 mg | MEDICATED_PATCH | Freq: Every day | TRANSDERMAL | Status: DC
  Administered 2015-09-27 – 2015-09-29 (×3): 21 mg via TRANSDERMAL
  Filled 2015-09-27 (×3): qty 1

## 2015-09-27 MED ORDER — MIDAZOLAM HCL 2 MG/2ML IJ SOLN
INTRAMUSCULAR | Status: AC
  Filled 2015-09-27: qty 2

## 2015-09-27 MED ORDER — HEPARIN SODIUM (PORCINE) 5000 UNIT/ML IJ SOLN
60.0000 [IU]/kg | INTRAMUSCULAR | Status: AC
  Administered 2015-09-27: 16:00:00 via INTRAVENOUS

## 2015-09-27 MED ORDER — ASPIRIN 81 MG PO CHEW
81.0000 mg | CHEWABLE_TABLET | Freq: Every day | ORAL | Status: DC
  Administered 2015-09-27 – 2015-09-29 (×3): 81 mg via ORAL
  Filled 2015-09-27 (×3): qty 1

## 2015-09-27 MED ORDER — CLOPIDOGREL BISULFATE 75 MG PO TABS
300.0000 mg | ORAL_TABLET | Freq: Once | ORAL | Status: AC
Start: 2015-09-27 — End: 2015-09-27
  Administered 2015-09-27: 300 mg via ORAL

## 2015-09-27 MED ORDER — CLOPIDOGREL BISULFATE 75 MG PO TABS: 300.0000 mg | ORAL_TABLET | Freq: Once | ORAL | Status: DC

## 2015-09-27 MED ORDER — SODIUM CHLORIDE 0.9 % IV SOLN: 10.0000 mL/h | INTRAVENOUS | Status: DC

## 2015-09-27 MED ORDER — NITROGLYCERIN 5 MG/ML IV SOLN
INTRAVENOUS | Status: AC
  Filled 2015-09-27: qty 10

## 2015-09-27 MED ORDER — IPRATROPIUM-ALBUTEROL 20-100 MCG/ACT IN AERS: 1.0000 | INHALATION_SPRAY | Freq: Four times a day (QID) | RESPIRATORY_TRACT | Status: DC | PRN

## 2015-09-27 MED ORDER — SODIUM CHLORIDE 0.9% FLUSH
3.0000 mL | Freq: Two times a day (BID) | INTRAVENOUS | Status: DC
  Administered 2015-09-27 – 2015-09-28 (×3): 3 mL via INTRAVENOUS

## 2015-09-27 MED ORDER — IPRATROPIUM-ALBUTEROL 0.5-2.5 (3) MG/3ML IN SOLN: 3.0000 mL | Freq: Four times a day (QID) | RESPIRATORY_TRACT | Status: DC | PRN

## 2015-09-27 MED ORDER — BIVALIRUDIN 250 MG IV SOLR
INTRAVENOUS | Status: AC
  Filled 2015-09-27: qty 250

## 2015-09-27 MED ORDER — IOPAMIDOL (ISOVUE-300) INJECTION 61%
INTRAVENOUS | Status: DC | PRN
  Administered 2015-09-27: 230 mL via INTRA_ARTERIAL

## 2015-09-27 MED ORDER — NITROGLYCERIN IN D5W 200-5 MCG/ML-% IV SOLN
5.0000 ug/min | Freq: Once | INTRAVENOUS | Status: AC
  Administered 2015-09-27: 5 ug/min via INTRAVENOUS

## 2015-09-27 MED ORDER — LABETALOL HCL 5 MG/ML IV SOLN
INTRAVENOUS | Status: DC | PRN
Start: 2015-09-27 — End: 2015-09-27
  Administered 2015-09-27 (×2): 10 mg via INTRAVENOUS

## 2015-09-27 MED ORDER — METOPROLOL TARTRATE 50 MG PO TABS
50.0000 mg | ORAL_TABLET | Freq: Three times a day (TID) | ORAL | Status: DC
  Administered 2015-09-27 – 2015-09-28 (×2): 50 mg via ORAL
  Filled 2015-09-27 (×2): qty 1

## 2015-09-27 MED ORDER — ASPIRIN 81 MG PO CHEW
324.0000 mg | CHEWABLE_TABLET | Freq: Once | ORAL | Status: AC
  Administered 2015-09-27: 324 mg via ORAL

## 2015-09-27 MED ORDER — SODIUM CHLORIDE 0.9 % IV SOLN: 250.0000 mL | INTRAVENOUS | Status: DC | PRN

## 2015-09-27 MED ORDER — BIVALIRUDIN 250 MG IV SOLR
250.0000 mg | INTRAVENOUS | Status: DC | PRN
  Administered 2015-09-27: 1.75 mg/kg/h via INTRAVENOUS

## 2015-09-27 MED ORDER — ONDANSETRON HCL 4 MG/2ML IJ SOLN
4.0000 mg | Freq: Four times a day (QID) | INTRAMUSCULAR | Status: DC | PRN
  Administered 2015-09-28: 4 mg via INTRAVENOUS
  Filled 2015-09-27: qty 2

## 2015-09-27 MED ORDER — FENTANYL CITRATE (PF) 100 MCG/2ML IJ SOLN
INTRAMUSCULAR | Status: AC
  Filled 2015-09-27: qty 2

## 2015-09-27 MED ORDER — ATORVASTATIN CALCIUM 20 MG PO TABS
80.0000 mg | ORAL_TABLET | Freq: Every day | ORAL | Status: DC
  Filled 2015-09-27 (×2): qty 1

## 2015-09-27 MED ORDER — ZOLPIDEM TARTRATE 5 MG PO TABS
5.0000 mg | ORAL_TABLET | Freq: Once | ORAL | Status: AC
  Administered 2015-09-27: 5 mg via ORAL
  Filled 2015-09-27: qty 1

## 2015-09-27 MED ORDER — LABETALOL HCL 5 MG/ML IV SOLN
INTRAVENOUS | Status: AC
  Filled 2015-09-27: qty 4

## 2015-09-27 MED ORDER — HEPARIN (PORCINE) IN NACL 2-0.9 UNIT/ML-% IJ SOLN
INTRAMUSCULAR | Status: AC
  Filled 2015-09-27: qty 1000

## 2015-09-27 MED ORDER — HYDRALAZINE HCL 20 MG/ML IJ SOLN: 10.0000 mg | Freq: Four times a day (QID) | INTRAMUSCULAR | Status: DC | PRN

## 2015-09-27 MED ORDER — LISINOPRIL 10 MG PO TABS
5.0000 mg | ORAL_TABLET | Freq: Two times a day (BID) | ORAL | Status: DC
  Administered 2015-09-27 – 2015-09-28 (×2): 5 mg via ORAL
  Filled 2015-09-27 (×2): qty 1

## 2015-09-27 MED ORDER — SODIUM CHLORIDE 0.9% FLUSH: 3.0000 mL | INTRAVENOUS | Status: DC | PRN

## 2015-09-27 MED ORDER — LABETALOL HCL 5 MG/ML IV SOLN: 10.0000 mg | INTRAVENOUS | Status: DC | PRN

## 2015-09-27 SURGICAL SUPPLY — 14 items
BALLN TREK RX 2.5X15 (BALLOONS) ×2
BALLOON TREK RX 2.5X15 (BALLOONS) ×1 IMPLANT
CATH INFINITI 5FR JL4 (CATHETERS) ×2 IMPLANT
CATH INFINITI JR4 5F (CATHETERS) ×2 IMPLANT
CATH VISTA GUIDE 6FR JR4 (CATHETERS) ×2 IMPLANT
DEVICE CLOSURE MYNXGRIP 6/7F (Vascular Products) ×2 IMPLANT
DEVICE INFLAT 30 PLUS (MISCELLANEOUS) ×2 IMPLANT
KIT MANI 3VAL PERCEP (MISCELLANEOUS) ×2 IMPLANT
NEEDLE PERC 18GX7CM (NEEDLE) ×2 IMPLANT
PACK CARDIAC CATH (CUSTOM PROCEDURE TRAY) ×2 IMPLANT
SHEATH AVANTI 6FR X 11CM (SHEATH) ×2 IMPLANT
STENT XIENCE ALPINE RX 3.0X28 (Permanent Stent) ×2 IMPLANT
WIRE EMERALD 3MM-J .035X150CM (WIRE) ×2 IMPLANT
WIRE G HI TQ BMW 190 (WIRE) ×2 IMPLANT

## 2015-09-27 NOTE — Progress Notes (Signed)
Spoke with Dr. Juliann Paresallwood. MD acknowledged.

## 2015-09-27 NOTE — Consult Note (Signed)
[facility] [place] [facility]   PATIENT NAME: Natalie Taylor    MR#:  [id number]  DATE OF BIRTH:  04/23/1960  DATE OF ADMISSION:  09/27/2015  PRIMARY CARE PHYSICIAN: Vonita MossMark Crissman, MD   REQUESTING/REFERRING PHYSICIAN: Rolly Pancakeallwood Dwane  CHIEF COMPLAINT:   Chief Complaint  Patient presents with  . Chest Pain    HISTORY OF PRESENT ILLNESS: Natalie Taylor  is a 55 y.o. female with a known history of Coronary artery disease and history of MI, hypertension, COPD, hyperlipidemia who is noncompliant with her medications presented with substernal chest pain and diaphoresis. Patient was evaluated in the ED and was noted to have ST elevation and was admitted for acute ST elevation MI. She underwent cardiac catheterization and stent to the LAD. We are asked to see the patient for medical management. She currently reports that her chest pain is resolved. Feeling much better. Denies any nausea vomiting or diarrhea  PAST MEDICAL HISTORY:   Past Medical History  Diagnosis Date  . Hypertension   . Coronary artery disease   . MI (myocardial infarction) (HCC)     x 2  . COPD (chronic obstructive pulmonary disease) (HCC)   . Arthritis   . Hyperlipidemia   . Sleep apnea     PAST SURGICAL HISTORY: Past Surgical History  Procedure Laterality Date  . Cardiac stents    . Tubal ligation      SOCIAL HISTORY:  Social History  Substance Use Topics  . Smoking status: Current Every Day Smoker  . Smokeless tobacco: Not on file  . Alcohol Use: No    FAMILY HISTORY:  Family History  Problem Relation Age of Onset  . Alcohol abuse Mother   . Arthritis Mother   . Asthma Mother   . Cancer Mother   . Hypertension Mother   . Migraines Mother     DRUG ALLERGIES: No Known Allergies  REVIEW OF SYSTEMS:   CONSTITUTIONAL: No fever, fatigue or weakness.  EYES: No blurred or double vision.  EARS, NOSE, AND THROAT: No tinnitus or ear pain.  RESPIRATORY: No cough, shortness  of breath, wheezing or hemoptysis.  CARDIOVASCULAR: Earlier chest pain, orthopnea, edema.  GASTROINTESTINAL: No nausea, vomiting, diarrhea or abdominal pain.  GENITOURINARY: No dysuria, hematuria.  ENDOCRINE: No polyuria, nocturia,  HEMATOLOGY: No anemia, easy bruising or bleeding SKIN: No rash or lesion. MUSCULOSKELETAL: No joint pain or arthritis.   NEUROLOGIC: No tingling, numbness, weakness.  PSYCHIATRY: No anxiety or depression.   MEDICATIONS AT HOME:  Prior to Admission medications   Medication Sig Start Date End Date Taking? Authorizing Provider  ibuprofen (ADVIL,MOTRIN) 600 MG tablet Take 1 tablet (600 mg total) by mouth every 6 (six) hours as needed for moderate pain (Take only if you are sure you're not pregnant.). 06/15/15   Gayla DossEryka A Gayle, MD  penicillin v potassium (VEETID) 500 MG tablet Take 1 tablet (500 mg total) by mouth 4 (four) times daily. 06/15/15   Gayla DossEryka A Gayle, MD      PHYSICAL EXAMINATION:   VITAL SIGNS: Blood pressure 180/112, pulse 71, temperature 98.1 F (36.7 C), temperature source Oral, resp. rate 21, weight 68.04 kg (150 lb), SpO2 100 %.  GENERAL:  55 y.o.-year-old patient lying in the bed with no acute distress.  EYES: Pupils equal, round, reactive to light and accommodation. No scleral icterus. Extraocular muscles intact.  HEENT: Head atraumatic, normocephalic. Oropharynx and nasopharynx clear.  NECK:  Supple, no jugular venous distention. No thyroid enlargement, no tenderness.  LUNGS: Normal breath sounds bilaterally, no wheezing, rales,rhonchi or crepitation. No use of accessory muscles of respiration.  CARDIOVASCULAR: S1, S2 normal. No murmurs, rubs, or gallops.  ABDOMEN: Soft, nontender, nondistended. Bowel sounds present. No organomegaly or mass.  EXTREMITIES: No pedal edema, cyanosis, or clubbing.  NEUROLOGIC: Cranial nerves II through XII are intact. Muscle strength 5/5 in all extremities. Sensation intact. Gait not checked.  PSYCHIATRIC: The  patient is alert and oriented x 3.  SKIN: No obvious rash, lesion, or ulcer.   LABORATORY PANEL:   CBC  Recent Labs Lab 09/27/15 1530  WBC 14.2*  HGB 13.2  HCT 39.9  PLT 182  MCV 87.9  MCH 29.0  MCHC 33.0  RDW 14.3  LYMPHSABS 2.4  MONOABS 0.9  EOSABS 0.3  BASOSABS 0.1   ------------------------------------------------------------------------------------------------------------------  Chemistries   Recent Labs Lab 09/27/15 1530  NA 139  K 3.1*  CL 111  CO2 21*  GLUCOSE 150*  BUN 13  CREATININE 0.87  CALCIUM 7.9*  AST 11*  ALT 6*  ALKPHOS 80  BILITOT 0.2*   ------------------------------------------------------------------------------------------------------------------ CrCl cannot be calculated (Unknown ideal weight.). ------------------------------------------------------------------------------------------------------------------ No results for input(s): TSH, T4TOTAL, T3FREE, THYROIDAB in the last 72 hours.  Invalid input(s): FREET3   Coagulation profile  Recent Labs Lab 09/27/15 1530  INR 1.03   ------------------------------------------------------------------------------------------------------------------- No results for input(s): DDIMER in the last 72 hours. -------------------------------------------------------------------------------------------------------------------  Cardiac Enzymes  Recent Labs Lab 09/27/15 1530 09/27/15 1749  TROPONINI 0.05* 9.29*   ------------------------------------------------------------------------------------------------------------------ Invalid input(s): POCBNP  ---------------------------------------------------------------------------------------------------------------  Urinalysis    Component Value Date/Time   COLORURINE YELLOW 09/21/2009 0534   APPEARANCEUR CLOUDY* 09/21/2009 0534   LABSPEC 1.007 09/21/2009 0534   PHURINE 6.0 09/21/2009 0534   GLUCOSEU NEGATIVE 09/21/2009 0534   HGBUR  LARGE* 09/21/2009 0534   BILIRUBINUR NEGATIVE 09/21/2009 0534   KETONESUR NEGATIVE 09/21/2009 0534   PROTEINUR 30* 09/21/2009 0534   UROBILINOGEN 1.0 09/21/2009 0534   NITRITE POSITIVE* 09/21/2009 0534   LEUKOCYTESUR LARGE* 09/21/2009 0534     RADIOLOGY: Dg Chest Port 1 View  09/27/2015  CLINICAL DATA:  Severe chest pain today EXAM: PORTABLE CHEST 1 VIEW COMPARISON:  None. FINDINGS: Heart size and vascular pattern normal. Right lung is clear. Mild discoid atelectasis left mid to lower lung zone. No pneumothorax or pleural effusion. IMPRESSION: No significant acute findings Electronically Signed   By: Esperanza Heir M.D.   On: 09/27/2015 15:54    EKG: Orders placed or performed during the hospital encounter of 09/27/15  . ED EKG  . ED EKG  . EKG 12-Lead  . EKG 12-Lead  . EKG 12-Lead immediately post procedure  . EKG 12-Lead  . EKG 12-Lead immediately post procedure    IMPRESSION AND PLAN: Patient is a 55 year old status post MI  1. Acute ST elevation MI Status post catheter with stent Management per cardiology I will add a fasting lipid panel in the morning  2. Accelerated hypertension Continue metoprolol and lisinopril When necessary labetalol I will add hydralazine as needed  3. Hyperlipidemia continue high-dose Lipitor Fasting lipid panel in the morning  4. Nicotine abuse patient recommended to stop using nicotine smoking cessation provided for many spent nicotine patch is artery ordered  5. COPD without any evidence of acute exasperation I will add Combivent MDI when necessary  6. Miscellaneous we will add Lovenox for DVT prophylaxis    All the records are reviewed and case discussed with ED provider. Management plans discussed with the patient, family and they are in  agreement.  CODE STATUS:    Code Status Orders        Start     Ordered   09/27/15 1647  Full code   Continuous     09/27/15 1646    Code Status History    Date Active Date Inactive  Code Status Order ID Comments User Context   This patient has a current code status but no historical code status.       TOTAL TIME TAKING CARE OF THIS PATIENT: 55 minutes.    Auburn Bilberry M.D on 09/27/2015 at 7:32 PM  Between 7am to 6pm - Pager - [phone]  After 6pm go to [email or web address] - password [email or web address] [facility]  [facility]  Office  [phone]  CC: Primary care physician; Vonita Moss, MD

## 2015-09-27 NOTE — ED Provider Notes (Signed)
[facility] Emergency Department Provider Note        Time seen: ----------------------------------------- 3:30 PM on 09/27/2015 -----------------------------------------    I have reviewed the triage vital signs and the nursing notes.   HISTORY  Chief Complaint No chief complaint on file.    HPI Natalie Taylor is a 55 y.o. female who describes severe chest pain today after she got up this morning. Patient is not sure what time that started. She describes sweats, nausea, shortness of breath. She has had history of heart attack in the past. She was given some nitroglycerin paste in route. Right now the pain is 10 out of 10, nothing makes it better.   Past Medical History  Diagnosis Date  . Hypertension   . Coronary artery disease   . MI (myocardial infarction) (HCC)     x 2  . COPD (chronic obstructive pulmonary disease) (HCC)   . Arthritis   . Hyperlipidemia   . Sleep apnea     There are no active problems to display for this patient.   Past Surgical History  Procedure Laterality Date  . Cardiac stents    . Tubal ligation    . Coronary artery bypass graft      Allergies Review of patient's allergies indicates no known allergies.  Social History Social History  Substance Use Topics  . Smoking status: Current Every Day Smoker  . Smokeless tobacco: Not on file  . Alcohol Use: No   Review of Systems Constitutional: Negative for fever. Eyes: Negative for visual changes. ENT: Negative for sore throat. Cardiovascular: Positive for chest pain Respiratory: Positive shortness of breath Gastrointestinal: Negative for abdominal pain, positive for nausea Genitourinary: Negative for dysuria. Musculoskeletal: Negative for back pain. Skin: Positive for sweats Neurological: Negative for headaches, focal weakness or numbness.  10-point ROS otherwise negative.  ____________________________________________   PHYSICAL EXAM:  VITAL  SIGNS: ED Triage Vitals  Enc Vitals Group     BP --      Pulse --      Resp --      Temp --      Temp src --      SpO2 --      Weight --      Height --      Head Cir --      Peak Flow --      Pain Score --      Pain Loc --      Pain Edu? --      Excl. in GC? --     Constitutional: Alert and oriented. Moderate distress Eyes: Conjunctivae are normal. PERRL. Normal extraocular movements. ENT   Head: Normocephalic and atraumatic.   Nose: No congestion/rhinnorhea.   Mouth/Throat: Mucous membranes are moist.   Neck: No stridor. Cardiovascular: Normal rate, regular rhythm. No murmurs, rubs, or gallops. Respiratory: Normal respiratory effort without tachypnea nor retractions. Breath sounds are clear and equal bilaterally. No wheezes/rales/rhonchi. Gastrointestinal: Soft and nontender. Normal bowel sounds Musculoskeletal: Nontender with normal range of motion in all extremities. No lower extremity tenderness nor edema. Neurologic:  Normal speech and language. No gross focal neurologic deficits are appreciated.  Skin:  Skin is warm, dry and intact. No rash noted. Psychiatric: Mood and affect are normal.  ____________________________________________  EKG: Interpreted by me. Sinus rhythm with a rate of 53 beats a minute, normal axis, inferior ST elevation suggesting ST elevation MI. Also ST depression over the septal leads. PVC is noted  ____________________________________________  ED COURSE:  Pertinent labs & imaging results that were available during my care of the patient were reviewed by me and considered in my medical decision making (see chart for details). Patient presents with ST elevation MI. STEMI has been activated. We will discuss with cardiology, we'll start heparin, aspirin, nitroglycerin, and Plavix. Patient will need emergent heart catheterization ____________________________________________    LABS (pertinent positives/negatives)  Labs Reviewed  CBC   DIFFERENTIAL  PROTIME-INR  APTT  COMPREHENSIVE METABOLIC PANEL  TROPONIN I  LIPID PANEL    RADIOLOGY  Chest x-ray  ____________________________________________  FINAL ASSESSMENT AND PLAN  Inferior ST elevation MI  Plan: Patient with labs and imaging as dictated above. Patient presents with a STEMI, was given medications as dictated above. Currently in critical condition.   Emily Filbert, MD   Note: This dictation was prepared with Dragon dictation. Any transcriptional errors that result from this process are unintentional   Emily Filbert, MD 09/27/15 1534

## 2015-09-27 NOTE — H&P (Signed)
Natalie Taylor is an 55 y.o. female.   Chief Complaint: ; STEMI IMI  HPI: Patient states she has a history of coronary disease presents to the past she states 5 years ago at [facility] in . Patient states that she woke up about an hour prior to presentation with severe substernal chest pain diaphoresis family called rescue EKG by EMS suggested inferior wall myocardial infarction. While in the ER she was found to have persistent ST elevation inferiorly so cardiology was called for possible cardiac catheter and acute intervention. Patient states that she has not been taking any of her medicines recently is not clear what medication she should be appears to still smoke. Details of her previous intervention are still unavailable. Patient denies bleeding history. Patient with at least one hours worth of substernal chest discomfort. Left  Past Medical History  Diagnosis Date  . Hypertension   . Coronary artery disease   . MI (myocardial infarction) (HCC)     x 2  . COPD (chronic obstructive pulmonary disease) (HCC)   . Arthritis   . Hyperlipidemia   . Sleep apnea     Past Surgical History  Procedure Laterality Date  . Cardiac stents    . Tubal ligation    . Coronary artery bypass graft      Family History  Problem Relation Age of Onset  . Alcohol abuse Mother   . Arthritis Mother   . Asthma Mother   . Cancer Mother   . Hypertension Mother   . Migraines Mother    Social History:  reports that she has been smoking.  She does not have any smokeless tobacco history on file. She reports that she does not drink alcohol. Her drug history is not on file.  Allergies: No Known Allergies  Medications Prior to Admission  Medication Sig Dispense Refill  . ibuprofen (ADVIL,MOTRIN) 600 MG tablet Take 1 tablet (600 mg total) by mouth every 6 (six) hours as needed for moderate pain (Take only if you are sure you're not pregnant.). 15 tablet 0  . penicillin v potassium (VEETID) 500 MG tablet  Take 1 tablet (500 mg total) by mouth 4 (four) times daily. 28 tablet 0    Results for orders placed or performed during the hospital encounter of 09/27/15 (from the past 48 hour(s))  CBC     Status: Abnormal   Collection Time: 09/27/15  3:30 PM  Result Value Ref Range   WBC 14.2 (H) 3.6 - 11.0 K/uL   RBC 4.54 3.80 - 5.20 MIL/uL   Hemoglobin 13.2 12.0 - 16.0 g/dL   HCT 39.9 35.0 - 47.0 %   MCV 87.9 80.0 - 100.0 fL   MCH 29.0 26.0 - 34.0 pg   MCHC 33.0 32.0 - 36.0 g/dL   RDW 14.3 11.5 - 14.5 %   Platelets 182 150 - 440 K/uL  Differential     Status: Abnormal   Collection Time: 09/27/15  3:30 PM  Result Value Ref Range   Neutrophils Relative % 73% %   Neutro Abs 10.5 (H) 1.4 - 6.5 K/uL   Lymphocytes Relative 17% %   Lymphs Abs 2.4 1.0 - 3.6 K/uL   Monocytes Relative 7% %   Monocytes Absolute 0.9 0.2 - 0.9 K/uL   Eosinophils Relative 2% %   Eosinophils Absolute 0.3 0 - 0.7 K/uL   Basophils Relative 1% %   Basophils Absolute 0.1 0 - 0.1 K/uL  Protime-INR     Status: None     Collection Time: 09/27/15  3:30 PM  Result Value Ref Range   Prothrombin Time 13.7 11.4 - 15.0 seconds   INR 1.03   APTT     Status: None   Collection Time: 09/27/15  3:30 PM  Result Value Ref Range   aPTT 27 24 - 36 seconds  Comprehensive metabolic panel     Status: Abnormal   Collection Time: 09/27/15  3:30 PM  Result Value Ref Range   Sodium 139 135 - 145 mmol/L   Potassium 3.1 (L) 3.5 - 5.1 mmol/L   Chloride 111 101 - 111 mmol/L   CO2 21 (L) 22 - 32 mmol/L   Glucose, Bld 150 (H) 65 - 99 mg/dL   BUN 13 6 - 20 mg/dL   Creatinine, Ser 0.87 0.44 - 1.00 mg/dL   Calcium 7.9 (L) 8.9 - 10.3 mg/dL   Total Protein 6.8 6.5 - 8.1 g/dL   Albumin 3.4 (L) 3.5 - 5.0 g/dL   AST 11 (L) 15 - 41 U/L   ALT 6 (L) 14 - 54 U/L   Alkaline Phosphatase 80 38 - 126 U/L   Total Bilirubin 0.2 (L) 0.3 - 1.2 mg/dL   GFR calc non Af Amer >60 >60 mL/min   GFR calc Af Amer >60 >60 mL/min    Comment: (NOTE) The eGFR has been  calculated using the CKD EPI equation. This calculation has not been validated in all clinical situations. eGFR's persistently <60 mL/min signify possible Chronic Kidney Disease.    Anion gap 7 5 - 15  Troponin I     Status: Abnormal   Collection Time: 09/27/15  3:30 PM  Result Value Ref Range   Troponin I 0.05 (H) <0.031 ng/mL    Comment: READ BACK AND VERIFIED WITH MARY ANN MCDANIEL AT 1645 09/27/2015 BY TFK        PERSISTENTLY INCREASED TROPONIN VALUES IN THE RANGE OF 0.04-0.49 ng/mL CAN BE SEEN IN:       -UNSTABLE ANGINA       -CONGESTIVE HEART FAILURE       -MYOCARDITIS       -CHEST TRAUMA       -ARRYHTHMIAS       -LATE PRESENTING MYOCARDIAL INFARCTION       -COPD   CLINICAL FOLLOW-UP RECOMMENDED.   Lipid panel     Status: Abnormal   Collection Time: 09/27/15  3:30 PM  Result Value Ref Range   Cholesterol 152 0 - 200 mg/dL   Triglycerides 90 <150 mg/dL   HDL 31 (L) >40 mg/dL   Total CHOL/HDL Ratio 4.9 RATIO   VLDL 18 0 - 40 mg/dL   LDL Cholesterol 103 (H) 0 - 99 mg/dL    Comment:        Total Cholesterol/HDL:CHD Risk Coronary Heart Disease Risk Table                     Men   Women  1/2 Average Risk   3.4   3.3  Average Risk       5.0   4.4  2 X Average Risk   9.6   7.1  3 X Average Risk  23.4   11.0        Use the calculated Patient Ratio above and the CHD Risk Table to determine the patient's CHD Risk.        ATP III CLASSIFICATION (LDL):  <100     mg/dL   Optimal  100-129  mg/dL     Near or Above                    Optimal  130-159  mg/dL   Borderline  160-189  mg/dL   High  >190     mg/dL   Very High   Ethanol     Status: None   Collection Time: 09/27/15  3:43 PM  Result Value Ref Range   Alcohol, Ethyl (B) <5 <5 mg/dL    Comment:        LOWEST DETECTABLE LIMIT FOR SERUM ALCOHOL IS 5 mg/dL FOR MEDICAL PURPOSES ONLY    Dg Chest Port 1 View  09/27/2015  CLINICAL DATA:  Severe chest pain today EXAM: PORTABLE CHEST 1 VIEW COMPARISON:  None.  FINDINGS: Heart size and vascular pattern normal. Right lung is clear. Mild discoid atelectasis left mid to lower lung zone. No pneumothorax or pleural effusion. IMPRESSION: No significant acute findings Electronically Signed   By: Raymond  Rubner M.D.   On: 09/27/2015 15:54    Review of Systems  Constitutional: Positive for malaise/fatigue and diaphoresis.  HENT: Positive for congestion.   Eyes: Negative.   Respiratory: Positive for shortness of breath.   Cardiovascular: Positive for chest pain, orthopnea and PND.  Gastrointestinal: Positive for heartburn and nausea.  Genitourinary: Negative.   Musculoskeletal: Negative.   Skin: Negative.   Neurological: Positive for weakness and headaches.  Endo/Heme/Allergies: Negative.   Psychiatric/Behavioral: Negative.     Blood pressure 198/98, pulse 71, temperature 97.6 F (36.4 C), temperature source Oral, resp. rate 18, weight 68.04 kg (150 lb), SpO2 99 %. Physical Exam  Nursing note and vitals reviewed. Constitutional: She is oriented to person, place, and time. She appears well-developed and well-nourished.  HENT:  Head: Normocephalic and atraumatic.  Eyes: Conjunctivae and EOM are normal. Pupils are equal, round, and reactive to light.  Neck: Normal range of motion. Neck supple.  Cardiovascular: Normal rate and regular rhythm.   Respiratory: Effort normal and breath sounds normal.  GI: Soft. Bowel sounds are normal.  Musculoskeletal: Normal range of motion.  Neurological: She is alert and oriented to person, place, and time. She has normal reflexes.  Skin: Skin is warm and dry.  Psychiatric: She has a normal mood and affect.     Assessment/Plan STEMI  IMI Hyperlipidemia Hypertension Smoking Coronary disease History of PCI stent . PLAN Agree with admission Follow-up EKGs and enzymes Recommended treatment to the cardiac Cath Lab for diagnostic cardiac catheterization Intention to treat possibly with PCI for acute myocardial  infarction Plavix load 75 mg a day in addition to aspirin  Anticoagulation during intervention possibly with Angiomax Aspirin therapy Statin therapy with Lipitor 80 mg once a day Start beta blockade therapy metoprolol 25 mg 4 times a day Start ACE inhibitor therapy and lisinopril 2.5 mg twice a day Consult hospitalist for medical care Continue aggressive cardiac evaluation Consider cardiac rehabilitation once the patient recovers   CALLWOOD,DWAYNE D., MD 09/27/2015, 5:17 PM    

## 2015-09-27 NOTE — Progress Notes (Signed)
Paged Dr. Juliann Paresallwood to report that troponin is 9.29

## 2015-09-27 NOTE — ED Notes (Signed)
Code stemi called at 1514 from EKG transmitted by EMS

## 2015-09-27 NOTE — ED Notes (Signed)
Patient woke up about one hour ago with chest pain.

## 2015-09-27 NOTE — ED Notes (Signed)
Patient given 324 asa by EMS

## 2015-09-28 ENCOUNTER — Encounter: Payer: Self-pay | Admitting: Internal Medicine

## 2015-09-28 DIAGNOSIS — I213 ST elevation (STEMI) myocardial infarction of unspecified site: Secondary | ICD-10-CM

## 2015-09-28 LAB — BASIC METABOLIC PANEL
Anion gap: 8 (ref 5–15)
BUN: 11 mg/dL (ref 6–20)
CO2: 22 mmol/L (ref 22–32)
Calcium: 7.9 mg/dL — ABNORMAL LOW (ref 8.9–10.3)
Chloride: 110 mmol/L (ref 101–111)
Creatinine, Ser: 0.71 mg/dL (ref 0.44–1.00)
GFR calc Af Amer: >60 mL/min (ref >60)
GFR calc non Af Amer: >60 mL/min (ref >60)
Glucose, Bld: 101 mg/dL — ABNORMAL HIGH (ref 65–99)
Potassium: 3.5 mmol/L (ref 3.5–5.1)
Sodium: 140 mmol/L (ref 135–145)

## 2015-09-28 LAB — TROPONIN I
Troponin I: 63.72 ng/mL — ABNORMAL HIGH (ref <0.031)
Troponin I: 45.26 ng/mL — ABNORMAL HIGH (ref <0.031)

## 2015-09-28 LAB — LIPID PANEL
Cholesterol: 146 mg/dL (ref 0–200)
HDL: 31 mg/dL — ABNORMAL LOW (ref >40)
LDL Cholesterol: 98 mg/dL (ref 0–99)
Total CHOL/HDL Ratio: 4.7 RATIO
Triglycerides: 83 mg/dL (ref <150)
VLDL: 17 mg/dL (ref 0–40)

## 2015-09-28 MED ORDER — METOPROLOL TARTRATE 50 MG PO TABS
50.0000 mg | ORAL_TABLET | Freq: Two times a day (BID) | ORAL | Status: DC
  Administered 2015-09-28 – 2015-09-29 (×2): 50 mg via ORAL
  Filled 2015-09-28 (×2): qty 1

## 2015-09-28 MED ORDER — LISINOPRIL 10 MG PO TABS
10.0000 mg | ORAL_TABLET | Freq: Two times a day (BID) | ORAL | Status: DC
  Administered 2015-09-28 – 2015-09-29 (×2): 10 mg via ORAL
  Filled 2015-09-28 (×2): qty 1

## 2015-09-28 MED ORDER — ENOXAPARIN SODIUM 40 MG/0.4ML ~~LOC~~ SOLN
40.0000 mg | SUBCUTANEOUS | Status: DC
  Administered 2015-09-28: 40 mg via SUBCUTANEOUS
  Filled 2015-09-28 (×2): qty 0.4

## 2015-09-28 NOTE — Progress Notes (Signed)
Patient ID: Natalie Taylor, female   DOB: [birth year], 55 y.o.   MRN: [id number] [facility] [place] [facility]   PATIENT NAME: Natalie Taylor    MR#:  [id number]  DATE OF BIRTH:  04-29-1960  SUBJECTIVE:   Came in with nausea, severe diaphoresisi and cp. Found to hve inferior wall MI Doing well today REVIEW OF SYSTEMS:   Review of Systems  Constitutional: Negative for fever, chills and weight loss.  HENT: Negative for ear discharge, ear pain and nosebleeds.   Eyes: Negative for blurred vision, pain and discharge.  Respiratory: Negative for sputum production, shortness of breath, wheezing and stridor.   Cardiovascular: Negative for chest pain, palpitations, orthopnea and PND.  Gastrointestinal: Negative for nausea, vomiting, abdominal pain and diarrhea.  Genitourinary: Negative for urgency and frequency.  Musculoskeletal: Negative for back pain and joint pain.  Neurological: Negative for sensory change, speech change, focal weakness and weakness.  Psychiatric/Behavioral: Negative for depression and hallucinations. The patient is not nervous/anxious.   All other systems reviewed and are negative.  Tolerating Diet:yes Tolerating PT: not needed  DRUG ALLERGIES:  No Known Allergies  VITALS:  Blood pressure 166/83, pulse 72, temperature 97.8 F (36.6 C), temperature source Oral, resp. rate 18, height  (1.651 m), weight 79.2 kg (174 lb 9.7 oz), SpO2 98 %.  PHYSICAL EXAMINATION:   Physical Exam  GENERAL:  55 y.o.-year-old patient lying in the bed with no acute distress.  EYES: Pupils equal, round, reactive to light and accommodation. No scleral icterus. Extraocular muscles intact.  HEENT: Head atraumatic, normocephalic. Oropharynx and nasopharynx clear.  NECK:  Supple, no jugular venous distention. No thyroid enlargement, no tenderness.  LUNGS: Normal breath sounds bilaterally, no wheezing, rales, rhonchi. No use of accessory muscles of respiration.   CARDIOVASCULAR: S1, S2 normal. No murmurs, rubs, or gallops.  ABDOMEN: Soft, nontender, nondistended. Bowel sounds present. No organomegaly or mass.  EXTREMITIES: No cyanosis, clubbing or edema b/l.    NEUROLOGIC: Cranial nerves II through XII are intact. No focal Motor or sensory deficits b/l.   PSYCHIATRIC:  patient is alert and oriented x 3.  SKIN: No obvious rash, lesion, or ulcer.   LABORATORY PANEL:  CBC  Recent Labs Lab 09/27/15 1530  WBC 14.2*  HGB 13.2  HCT 39.9  PLT 182    Chemistries   Recent Labs Lab 09/27/15 1530 09/28/15 0606  NA 139 140  K 3.1* 3.5  CL 111 110  CO2 21* 22  GLUCOSE 150* 101*  BUN 13 11  CREATININE 0.87 0.71  CALCIUM 7.9* 7.9*  AST 11*  --   ALT 6*  --   ALKPHOS 80  --   BILITOT 0.2*  --    Cardiac Enzymes  Recent Labs Lab 09/28/15 0606  TROPONINI 45.26*   RADIOLOGY:  Dg Chest Port 1 View  09/27/2015  CLINICAL DATA:  Severe chest pain today EXAM: PORTABLE CHEST 1 VIEW COMPARISON:  None. FINDINGS: Heart size and vascular pattern normal. Right lung is clear. Mild discoid atelectasis left mid to lower lung zone. No pneumothorax or pleural effusion. IMPRESSION: No significant acute findings Electronically Signed   By: Esperanza Heir M.D.   On: 09/27/2015 15:54   ASSESSMENT AND PLAN:  55 year old status post MI  1. Acute ST elevation MI Status post cardiac cath with DES stent in 100% occluded RCA Management per cardiology Cont asa,brilinta,BB, ace and staitns  2. Accelerated hypertension Continue metoprolol and lisinopril When necessary labetalol  3.  Hyperlipidemia continue high-dose Lipitor  4. Nicotine abuse patient recommended to stop using nicotine   5. COPD without any evidence of acute exasperation  Combivent MDI when necessary  Pt improving.  Case discussed with Care Management/Social Worker. Management plans discussed with the patient, family and they are in agreement.  CODE STATUS: full DVT Prophylaxis:  lovenox  TOTAL TIME TAKING CARE OF THIS PATIENT: 30 minutes.  >50% time spent on counselling and coordination of care  Note: This dictation was prepared with Dragon dictation along with smaller phrase technology. Any transcriptional errors that result from this process are unintentional.  Hagan Maltz M.D on 09/28/2015 at 11:39 AM  Between 7am to 6pm - Pager - [phone]  After 6pm go to [email or web address] - password [email or web address] [email or web address]  [facility]  Office  [phone]  CC: Primary care physician; Vonita MossMark Crissman, MD

## 2015-09-28 NOTE — Progress Notes (Signed)
Spoke with Dr. Enedina FinnerSona Patel about patient's pending Urine Drug Screen from the Emergency Department. MD discontinued ordered for urine drug screen.

## 2015-09-28 NOTE — Progress Notes (Signed)
[facility]  SUBJECTIVE: Feels a lot better. No more chest pain or diaphoresis. Catheter site is clean and dry   Filed Vitals:   09/28/15 0500 09/28/15 0600 09/28/15 0800 09/28/15 0903  BP: 159/82 160/75 149/75 166/83  Pulse: 65 63 81 72  Temp:   97.8 F (36.6 C)   TempSrc:   Oral   Resp: 18 19 18    Height:      Weight:      SpO2: 99% 100% 98%     Intake/Output Summary (Last 24 hours) at 09/28/15 1232 Last data filed at 09/28/15 1100  Gross per 24 hour  Intake    531 ml  Output   1675 ml  Net  -1144 ml    LABS: Basic Metabolic Panel:  Recent Labs  81/19/1404/12/05 1530 09/28/15 0606  NA 139 140  K 3.1* 3.5  CL 111 110  CO2 21* 22  GLUCOSE 150* 101*  BUN 13 11  CREATININE 0.87 0.71  CALCIUM 7.9* 7.9*   Liver Function Tests:  Recent Labs  09/27/15 1530  AST 11*  ALT 6*  ALKPHOS 80  BILITOT 0.2*  PROT 6.8  ALBUMIN 3.4*   No results for input(s): LIPASE, AMYLASE in the last 72 hours. CBC:  Recent Labs  09/27/15 1530  WBC 14.2*  NEUTROABS 10.5*  HGB 13.2  HCT 39.9  MCV 87.9  PLT 182   Cardiac Enzymes:  Recent Labs  09/27/15 1749 09/28/15 0005 09/28/15 0606  TROPONINI 9.29* 63.72* 45.26*   BNP: Invalid input(s): POCBNP D-Dimer: No results for input(s): DDIMER in the last 72 hours. Hemoglobin A1C: No results for input(s): HGBA1C in the last 72 hours. Fasting Lipid Panel:  Recent Labs  09/28/15 0606  CHOL 146  HDL 31*  LDLCALC 98  TRIG 83  CHOLHDL 4.7   Thyroid Function Tests: No results for input(s): TSH, T4TOTAL, T3FREE, THYROIDAB in the last 72 hours.  Invalid input(s): FREET3 Anemia Panel: No results for input(s): VITAMINB12, FOLATE, FERRITIN, TIBC, IRON, RETICCTPCT in the last 72 hours.   Physical Exam: Blood pressure 166/83, pulse 72, temperature 97.8 F (36.6 C), temperature source Oral, resp. rate 18, height 5\' 5"  (1.651 m), weight 79.2 kg (174 lb 9.7 oz), SpO2 98 %.   Wt Readings  from Last 1 Encounters:  09/27/15 79.2 kg (174 lb 9.7 oz)     General appearance: alert and cooperative Neck: no adenopathy, no carotid bruit, no JVD, supple, symmetrical, trachea midline and thyroid not enlarged, symmetric, no tenderness/mass/nodules Resp: clear to auscultation bilaterally Cardio: regular rate and rhythm GI: soft, non-tender; bowel sounds normal; no masses,  no organomegaly Pulses: 2+ and symmetric Neurologic: Grossly normal  TELEMETRY: Reviewed telemetry pt in normal sinus rhythm no arrhythmia or ischemia:  ASSESSMENT AND PLAN:  Active Problems:   ST elevation (STEMI) myocardial infarction involving right coronary artery (HCC)-patient presented with an inferior myocardial infarction. She had a previous stent in the RCA. She was noted to have thrombosis proximal to the previous stent. This was treated with a repeat stenting. Patient had been noncompliant with her medications. She also continues to smoke cigarettes. Discussed importance of compliance with medication and refraining from smoking. She is doing quite well today on Brilinta. However given the extent of her myocardial infarction, would transferred to telemetry and monitor for 24 hours and if stable discharge in the morning. Will need enteric-coated aspirin at 81 mg daily, atorvastatin high intensity dose of 80 mg daily, beta blocker metoprolol  tartrate at 50 mg twice daily. We'll add lisinopril 10 mg daily for afterload reduction post MI. She needs to be enrolled in phase II cardiac rehabilitation.    Dalia Heading., MD, [facility] 09/28/2015 12:32 PM

## 2015-09-28 NOTE — Plan of Care (Signed)
Problem: Phase I Progression Outcomes Goal: Other Phase I Outcomes/Goals Outcome: Completed/Met Date Met:  09/28/15 Pt ambulated twice around unit. Pt provided smoking cessation packet, educated on same. Pt provided diet education, pt refused. Vascular site stable, no signs of bleeding or hematoma. Pt to be transferred to room 249, report called to Tammy, RN. Pt and significant other at bedside aware of transfer.      

## 2015-09-29 MED ORDER — METOPROLOL TARTRATE 50 MG PO TABS: 50.0000 mg | ORAL_TABLET | Freq: Two times a day (BID) | ORAL | Status: DC

## 2015-09-29 MED ORDER — TICAGRELOR 90 MG PO TABS: 90.0000 mg | ORAL_TABLET | Freq: Two times a day (BID) | ORAL | Status: DC

## 2015-09-29 MED ORDER — LISINOPRIL 10 MG PO TABS: 10.0000 mg | ORAL_TABLET | Freq: Two times a day (BID) | ORAL | Status: DC

## 2015-09-29 MED ORDER — ATORVASTATIN CALCIUM 80 MG PO TABS: 80.0000 mg | ORAL_TABLET | Freq: Every day | ORAL | Status: DC

## 2015-09-29 MED ORDER — ASPIRIN 81 MG PO CHEW: 81.0000 mg | CHEWABLE_TABLET | Freq: Every day | ORAL | Status: DC

## 2015-09-29 NOTE — Progress Notes (Signed)
[facility]  SUBJECTIVE: No further chest paini. Resting comfortabley. Anxious to go home.    Filed Vitals:   09/28/15 1800 09/28/15 1850 09/28/15 1946 09/29/15 0548  BP: 153/91 154/70 155/80 141/84  Pulse: 65 61 63 61  Temp:  97.8 F (36.6 C) 98.3 F (36.8 C) 98.3 F (36.8 C)  TempSrc:  Oral Oral Oral  Resp: 15 18 18 16   Height:      Weight:      SpO2: 96% 98% 96% 96%    Intake/Output Summary (Last 24 hours) at 09/29/15 0756 Last data filed at 09/28/15 1400  Gross per 24 hour  Intake    480 ml  Output    475 ml  Net      5 ml    LABS: Basic Metabolic Panel:  Recent Labs  60/45/4004/12/05 1530 09/28/15 0606  NA 139 140  K 3.1* 3.5  CL 111 110  CO2 21* 22  GLUCOSE 150* 101*  BUN 13 11  CREATININE 0.87 0.71  CALCIUM 7.9* 7.9*   Liver Function Tests:  Recent Labs  09/27/15 1530  AST 11*  ALT 6*  ALKPHOS 80  BILITOT 0.2*  PROT 6.8  ALBUMIN 3.4*   No results for input(s): LIPASE, AMYLASE in the last 72 hours. CBC:  Recent Labs  09/27/15 1530  WBC 14.2*  NEUTROABS 10.5*  HGB 13.2  HCT 39.9  MCV 87.9  PLT 182   Cardiac Enzymes:  Recent Labs  09/27/15 1749 09/28/15 0005 09/28/15 0606  TROPONINI 9.29* 63.72* 45.26*   BNP: Invalid input(s): POCBNP D-Dimer: No results for input(s): DDIMER in the last 72 hours. Hemoglobin A1C: No results for input(s): HGBA1C in the last 72 hours. Fasting Lipid Panel:  Recent Labs  09/28/15 0606  CHOL 146  HDL 31*  LDLCALC 98  TRIG 83  CHOLHDL 4.7   Thyroid Function Tests: No results for input(s): TSH, T4TOTAL, T3FREE, THYROIDAB in the last 72 hours.  Invalid input(s): FREET3 Anemia Panel: No results for input(s): VITAMINB12, FOLATE, FERRITIN, TIBC, IRON, RETICCTPCT in the last 72 hours.   Physical Exam: Blood pressure 141/84, pulse 61, temperature 98.3 F (36.8 C), temperature source Oral, resp. rate 16, height 5\' 5"  (1.651 m), weight 79.2 kg (174 lb 9.7 oz),  SpO2 96 %.   Wt Readings from Last 1 Encounters:  09/27/15 79.2 kg (174 lb 9.7 oz)     General appearance: alert and cooperative Head: Normocephalic, without obvious abnormality, atraumatic Resp: clear to auscultation bilaterally Chest wall: no tenderness Cardio: regular rate and rhythm, S1, S2 normal, no murmur, click, rub or gallop GI: soft, non-tender; bowel sounds normal; no masses,  no organomegaly Pulses: 2+ and symmetric Neurologic: Grossly normal  TELEMETRY: Reviewed telemetry pt in nsr:  ASSESSMENT AND PLAN:  Active Problems:   ST elevation (STEMI) myocardial infarction involving right coronary artery (HCC)-s/p inferior stemi treated with pci. Currently stable post mi. Would ambulate this am and if stable discharge on atorvastatin 80 mg daily; lisinopril 10 mg daily; metoprolol 50 mg bid; brilinta 90 mg bid; ans ecotrin 81 mg daily from cardiac standpoint. Will need Phase 2 outpatient cardiac rehab. Smoking cessation was discussed. Follow up with Dr. Juliann Paresallwood in 1 week.     Dalia HeadingFATH,Mouna Yager A., MD, [facility] HospitalFACC 09/29/2015 7:56 AM

## 2015-09-29 NOTE — Progress Notes (Addendum)
Patient had ST depression on the  Cardiac strip. Dr. Tobi BastosPyreddy notified with a new order for 12 lead EKG. Patient is asymptomatic, alert and oriented x 4. Requested for Tylenol 650 mg for headache and re- assessment, she was resting quietly with her eyes closed, respirations even and unlabored. Will continue to monitor.

## 2015-09-29 NOTE — Discharge Instructions (Signed)
Stop smokling

## 2015-09-29 NOTE — Progress Notes (Signed)
Dr. Tobi BastosPyreddy reviewed EKG results personally, no new order received. Patient denied any acute chest pain.

## 2015-09-29 NOTE — Discharge Summary (Signed)
[facility] [place] [facility]   PATIENT NAME: Natalie Taylor    MR#:  [id number]  DATE OF BIRTH:  [birth year]  DATE OF ADMISSION:  09/27/2015 ADMITTING PHYSICIAN: Alwyn Pea, MD  DATE OF DISCHARGE: 6/101/7  PRIMARY CARE PHYSICIAN: Vonita Moss, MD    ADMISSION DIAGNOSIS:  ST elevation myocardial infarction (STEMI), unspecified artery (HCC) [I21.3]  DISCHARGE DIAGNOSIS:   Acute Inferior wall MI s/p successful PCI and stent to the proximal to mid totally occluded RCA with in-stent thrombosis with a DES stent  CAD  Tobacco abuse  HTN  SECONDARY DIAGNOSIS:   Past Medical History  Diagnosis Date  . Hypertension   . Coronary artery disease   . MI (myocardial infarction) (HCC)     x 2  . COPD (chronic obstructive pulmonary disease) (HCC)   . Arthritis   . Hyperlipidemia   . Sleep apnea     HOSPITAL COURSE:  55 year old status post MI  1. Acute ST elevation MI Status post cardiac cath with DES stent in 100% occluded RCA Management per cardiology Cont asa,brilinta,BB, ace and statins  2. Accelerated hypertension Continue metoprolol and lisinopril When necessary labetalol  3. Hyperlipidemia continue high-dose Lipitor  4. Nicotine abuse patient recommended to stop using nicotine   5. COPD without any evidence of acute exasperation Combivent MDI when necessary  Overall stable. Ok to go home per Dr Lady Gary  CONSULTS OBTAINED:  Treatment Team:  Dalia Heading, MD  DRUG ALLERGIES:  No Known Allergies  DISCHARGE MEDICATIONS:   Current Discharge Medication List    START taking these medications   Details  aspirin 81 MG chewable tablet Chew 1 tablet (81 mg total) by mouth daily. Qty: 30 tablet, Refills: 3    atorvastatin (LIPITOR) 80 MG tablet Take 1 tablet (80 mg total) by mouth daily at 6 PM. Qty: 30 tablet, Refills: 3    lisinopril (PRINIVIL,ZESTRIL) 10 MG tablet Take 1 tablet (10 mg total) by mouth every 12 (twelve)  hours. Qty: 30 tablet, Refills: 3    metoprolol (LOPRESSOR) 50 MG tablet Take 1 tablet (50 mg total) by mouth 2 (two) times daily. Qty: 60 tablet, Refills: 3    ticagrelor (BRILINTA) 90 MG TABS tablet Take 1 tablet (90 mg total) by mouth 2 (two) times daily. Qty: 60 tablet, Refills: 3      CONTINUE these medications which have NOT CHANGED   Details  penicillin v potassium (VEETID) 500 MG tablet Take 1 tablet (500 mg total) by mouth 4 (four) times daily. Qty: 28 tablet, Refills: 0      STOP taking these medications     ibuprofen (ADVIL,MOTRIN) 600 MG tablet         If you experience worsening of your admission symptoms, develop shortness of breath, life threatening emergency, suicidal or homicidal thoughts you must seek medical attention immediately by calling 911 or calling your MD immediately  if symptoms less severe.  You Must read complete instructions/literature along with all the possible adverse reactions/side effects for all the Medicines you take and that have been prescribed to you. Take any new Medicines after you have completely understood and accept all the possible adverse reactions/side effects.   Please note  You were cared for by a hospitalist during your hospital stay. If you have any questions about your discharge medications or the care you received while you were in the hospital after you are discharged, you can call the unit and asked to speak with  the hospitalist on call if the hospitalist that took care of you is not available. Once you are discharged, your primary care physician will handle any further medical issues. Please note that NO REFILLS for any discharge medications will be authorized once you are discharged, as it is imperative that you return to your primary care physician (or establish a relationship with a primary care physician if you do not have one) for your aftercare needs so that they can reassess your need for medications and monitor your lab  values. Today   SUBJECTIVE   No complaints VITAL SIGNS:  Blood pressure 141/84, pulse 61, temperature 98.3 F (36.8 C), temperature source Oral, resp. rate 16, height 5\' 5"  (1.651 m), weight 79.2 kg (174 lb 9.7 oz), SpO2 96 %.  I/O:   Intake/Output Summary (Last 24 hours) at 09/29/15 1006 Last data filed at 09/29/15 0816  Gross per 24 hour  Intake    480 ml  Output    300 ml  Net    180 ml    PHYSICAL EXAMINATION:  GENERAL:  55 y.o.-year-old patient lying in the bed with no acute distress.  EYES: Pupils equal, round, reactive to light and accommodation. No scleral icterus. Extraocular muscles intact.  HEENT: Head atraumatic, normocephalic. Oropharynx and nasopharynx clear.  NECK:  Supple, no jugular venous distention. No thyroid enlargement, no tenderness.  LUNGS: Normal breath sounds bilaterally, no wheezing, rales,rhonchi or crepitation. No use of accessory muscles of respiration.  CARDIOVASCULAR: S1, S2 normal. No murmurs, rubs, or gallops.  ABDOMEN: Soft, non-tender, non-distended. Bowel sounds present. No organomegaly or mass.  EXTREMITIES: No pedal edema, cyanosis, or clubbing.  NEUROLOGIC: Cranial nerves II through XII are intact. Muscle strength 5/5 in all extremities. Sensation intact. Gait not checked.  PSYCHIATRIC: The patient is alert and oriented x 3.  SKIN: No obvious rash, lesion, or ulcer.   DATA REVIEW:   CBC   Recent Labs Lab 09/27/15 1530  WBC 14.2*  HGB 13.2  HCT 39.9  PLT 182    Chemistries   Recent Labs Lab 09/27/15 1530 09/28/15 0606  NA 139 140  K 3.1* 3.5  CL 111 110  CO2 21* 22  GLUCOSE 150* 101*  BUN 13 11  CREATININE 0.87 0.71  CALCIUM 7.9* 7.9*  AST 11*  --   ALT 6*  --   ALKPHOS 80  --   BILITOT 0.2*  --     Microbiology Results   Recent Results (from the past 240 hour(s))  MRSA PCR Screening     Status: None   Collection Time: 09/27/15  6:58 PM  Result Value Ref Range Status   MRSA by PCR NEGATIVE NEGATIVE Final     Comment:        The GeneXpert MRSA Assay (FDA approved for NASAL specimens only), is one component of a comprehensive MRSA colonization surveillance program. It is not intended to diagnose MRSA infection nor to guide or monitor treatment for MRSA infections.     RADIOLOGY:  Dg Chest Port 1 View  09/27/2015  CLINICAL DATA:  Severe chest pain today EXAM: PORTABLE CHEST 1 VIEW COMPARISON:  None. FINDINGS: Heart size and vascular pattern normal. Right lung is clear. Mild discoid atelectasis left mid to lower lung zone. No pneumothorax or pleural effusion. IMPRESSION: No significant acute findings Electronically Signed   By: Esperanza Heir M.D.   On: 09/27/2015 15:54     Management plans discussed with the patient, family and they are in agreement.  CODE STATUS:     Code Status Orders        Start     Ordered   09/27/15 1647  Full code   Continuous     09/27/15 1646    Code Status History    Date Active Date Inactive Code Status Order ID Comments User Context   This patient has a current code status but no historical code status.      TOTAL TIME TAKING CARE OF THIS PATIENT: 40 minutes.    Milley Vining M.D on 09/29/2015 at 10:06 AM  Between 7am to 6pm - Pager - [phone] After 6pm go to [email or web address] - password [email or web address] [facility]  [facility]  Office  [phone]  CC: Primary care physician; Vonita MossMark Crissman, MD

## 2015-10-09 ENCOUNTER — Emergency Department: Payer: Medicaid Other

## 2015-10-09 ENCOUNTER — Encounter: Payer: Self-pay | Admitting: Occupational Medicine

## 2015-10-09 ENCOUNTER — Emergency Department
Admission: EM | Admit: 2015-10-09 | Discharge: 2015-10-10 | Disposition: A | Discharge status: Home / Self Care | Payer: Medicaid Other | Attending: Emergency Medicine | Admitting: Emergency Medicine

## 2015-10-09 ENCOUNTER — Other Ambulatory Visit: Payer: Self-pay

## 2015-10-09 DIAGNOSIS — I2510 Atherosclerotic heart disease of native coronary artery without angina pectoris: Secondary | ICD-10-CM | POA: Insufficient documentation

## 2015-10-09 DIAGNOSIS — F17200 Nicotine dependence, unspecified, uncomplicated: Secondary | ICD-10-CM | POA: Diagnosis not present

## 2015-10-09 DIAGNOSIS — J449 Chronic obstructive pulmonary disease, unspecified: Secondary | ICD-10-CM | POA: Insufficient documentation

## 2015-10-09 DIAGNOSIS — R52 Pain, unspecified: Secondary | ICD-10-CM

## 2015-10-09 DIAGNOSIS — N390 Urinary tract infection, site not specified: Secondary | ICD-10-CM

## 2015-10-09 DIAGNOSIS — E785 Hyperlipidemia, unspecified: Secondary | ICD-10-CM | POA: Insufficient documentation

## 2015-10-09 DIAGNOSIS — R42 Dizziness and giddiness: Secondary | ICD-10-CM | POA: Diagnosis present

## 2015-10-09 DIAGNOSIS — Z79899 Other long term (current) drug therapy: Secondary | ICD-10-CM | POA: Diagnosis not present

## 2015-10-09 DIAGNOSIS — I252 Old myocardial infarction: Secondary | ICD-10-CM | POA: Insufficient documentation

## 2015-10-09 DIAGNOSIS — Z7982 Long term (current) use of aspirin: Secondary | ICD-10-CM | POA: Insufficient documentation

## 2015-10-09 DIAGNOSIS — M1990 Unspecified osteoarthritis, unspecified site: Secondary | ICD-10-CM | POA: Insufficient documentation

## 2015-10-09 DIAGNOSIS — I10 Essential (primary) hypertension: Secondary | ICD-10-CM | POA: Insufficient documentation

## 2015-10-09 DIAGNOSIS — R609 Edema, unspecified: Secondary | ICD-10-CM

## 2015-10-09 LAB — CBC WITH DIFFERENTIAL/PLATELET
Basophils Absolute: 0.1 10*3/uL (ref 0–0.1)
Basophils Relative: 1 %
Eosinophils Absolute: 0.3 10*3/uL (ref 0–0.7)
Eosinophils Relative: 2 %
HCT: 38.3 % (ref 35.0–47.0)
Hemoglobin: 13.1 g/dL (ref 12.0–16.0)
Lymphocytes Relative: 14 %
Lymphs Abs: 1.9 10*3/uL (ref 1.0–3.6)
MCH: 29.7 pg (ref 26.0–34.0)
MCHC: 34.1 g/dL (ref 32.0–36.0)
MCV: 87 fL (ref 80.0–100.0)
Monocytes Absolute: 1.2 10*3/uL — ABNORMAL HIGH (ref 0.2–0.9)
Monocytes Relative: 9 %
Neutro Abs: 10.2 10*3/uL — ABNORMAL HIGH (ref 1.4–6.5)
Neutrophils Relative %: 74 %
Platelets: 300 10*3/uL (ref 150–440)
RBC: 4.41 MIL/uL (ref 3.80–5.20)
RDW: 14 % (ref 11.5–14.5)
WBC: 13.7 10*3/uL — ABNORMAL HIGH (ref 3.6–11.0)

## 2015-10-09 LAB — BASIC METABOLIC PANEL
Anion gap: 9 (ref 5–15)
BUN: 11 mg/dL (ref 6–20)
CO2: 23 mmol/L (ref 22–32)
Calcium: 8.7 mg/dL — ABNORMAL LOW (ref 8.9–10.3)
Chloride: 105 mmol/L (ref 101–111)
Creatinine, Ser: 0.79 mg/dL (ref 0.44–1.00)
GFR calc Af Amer: >60 mL/min (ref >60)
GFR calc non Af Amer: >60 mL/min (ref >60)
Glucose, Bld: 100 mg/dL — ABNORMAL HIGH (ref 65–99)
Potassium: 3.5 mmol/L (ref 3.5–5.1)
Sodium: 137 mmol/L (ref 135–145)

## 2015-10-09 LAB — URINALYSIS COMPLETE WITH MICROSCOPIC (ARMC ONLY)
Bilirubin Urine: NEGATIVE
Glucose, UA: NEGATIVE mg/dL
Ketones, ur: NEGATIVE mg/dL
Nitrite: NEGATIVE
Protein, ur: 30 mg/dL — AB
Specific Gravity, Urine: 1.009 (ref 1.005–1.030)
pH: 7 (ref 5.0–8.0)

## 2015-10-09 LAB — TROPONIN I: Troponin I: 0.18 ng/mL — ABNORMAL HIGH (ref <0.031)

## 2015-10-09 MED ORDER — ONDANSETRON HCL 4 MG/2ML IJ SOLN
4.0000 mg | Freq: Once | INTRAMUSCULAR | Status: AC
  Administered 2015-10-09: 4 mg via INTRAVENOUS
  Filled 2015-10-09: qty 2

## 2015-10-09 MED ORDER — SODIUM CHLORIDE 0.9 % IV BOLUS (SEPSIS)
1000.0000 mL | Freq: Once | INTRAVENOUS | Status: AC
  Administered 2015-10-09: 1000 mL via INTRAVENOUS

## 2015-10-09 MED ORDER — MORPHINE SULFATE (PF) 2 MG/ML IV SOLN
2.0000 mg | Freq: Once | INTRAVENOUS | Status: AC
  Administered 2015-10-09: 2 mg via INTRAVENOUS
  Filled 2015-10-09: qty 1

## 2015-10-09 NOTE — ED Notes (Signed)
Pt reports having generalized weakness, loss of appetite and general malaise since heart attack 2 weeks ago. Pt states she started to have right shoulder pain on Sunday and woke up today with right wrist pain.

## 2015-10-09 NOTE — ED Provider Notes (Signed)
[facility] Emergency Department Provider Note  ____________________________________________  Time seen: 11:00 PM  I have reviewed the triage vital signs and the nursing notes.   HISTORY  Chief Complaint Hand Pain; Dizziness; Shortness of Breath; Headache; and Generalized Body Aches      HPI Natalie Taylor is a 55 y.o. female vessels myocardial infarction on 09/29/2015 returns to the emergency department with generalized weakness, urinary frequency right flank pain, nontraumatic right arm pain and swelling, headache. Patient states also the symptoms of been persistent since myocardial infarction however patient states the arm swelling started 2 days ago.   Past Medical History  Diagnosis Date  . Hypertension   . Coronary artery disease   . MI (myocardial infarction) (HCC)     x 2  . COPD (chronic obstructive pulmonary disease) (HCC)   . Arthritis   . Hyperlipidemia   . Sleep apnea     Patient Active Problem List   Diagnosis Date Noted  . ST elevation (STEMI) myocardial infarction involving right coronary artery (HCC) 09/27/2015    Past Surgical History  Procedure Laterality Date  . Cardiac stents    . Tubal ligation    . Cardiac catheterization N/A 09/27/2015    Procedure: Left Heart Cath and Coronary Angiography;  Surgeon: Alwyn Pea, MD;  Location: [facility] INVASIVE CV LAB;  Service: Cardiovascular;  Laterality: N/A;  . Cardiac catheterization N/A 09/27/2015    Procedure: Coronary Stent Intervention;  Surgeon: Alwyn Pea, MD;  Location: [facility] INVASIVE CV LAB;  Service: Cardiovascular;  Laterality: N/A;  . Cholecystectomy      Current Outpatient Rx  Name  Route  Sig  Dispense  Refill  . aspirin 81 MG chewable tablet   Oral   Chew 1 tablet (81 mg total) by mouth daily.   30 tablet   3   . atorvastatin (LIPITOR) 80 MG tablet   Oral   Take 1 tablet (80 mg total) by mouth daily at 6 PM.   30 tablet   3   . lisinopril  (PRINIVIL,ZESTRIL) 10 MG tablet   Oral   Take 1 tablet (10 mg total) by mouth every 12 (twelve) hours.   30 tablet   3   . metoprolol (LOPRESSOR) 50 MG tablet   Oral   Take 1 tablet (50 mg total) by mouth 2 (two) times daily.   60 tablet   3   . penicillin v potassium (VEETID) 500 MG tablet   Oral   Take 1 tablet (500 mg total) by mouth 4 (four) times daily.   28 tablet   0   . ticagrelor (BRILINTA) 90 MG TABS tablet   Oral   Take 1 tablet (90 mg total) by mouth 2 (two) times daily.   60 tablet   3     Allergies No known drug allergies  Family History  Problem Relation Age of Onset  . Alcohol abuse Mother   . Arthritis Mother   . Asthma Mother   . Cancer Mother   . Hypertension Mother   . Migraines Mother     Social History Social History  Substance Use Topics  . Smoking status: Current Every Day Smoker  . Smokeless tobacco: None  . Alcohol Use: No    Review of Systems  Constitutional: Negative for fever. Eyes: Negative for visual changes. ENT: Negative for sore throat. Cardiovascular: Negative for chest pain. Respiratory: Negative for shortness of breath. Gastrointestinal: Negative for abdominal pain, vomiting and diarrhea. Genitourinary:  Negative for dysuria. Musculoskeletal: Negative for back pain. Skin: Negative for rash. Neurological: Negative for headaches, focal weakness or numbness.   10-point ROS otherwise negative.  ____________________________________________   PHYSICAL EXAM:  VITAL SIGNS: ED Triage Vitals  Enc Vitals Group     BP 10/09/15 2151 147/105 mmHg     Pulse Rate 10/09/15 2151 65     Resp 10/09/15 2151 18     Temp 10/09/15 2151 98.3 F (36.8 C)     Temp src --      SpO2 10/09/15 2151 100 %     Weight 10/09/15 2151 140 lb (63.504 kg)     Height 10/09/15 2151 5\' 5"  (1.651 m)     Head Cir --      Peak Flow --      Pain Score 10/09/15 2152 8     Pain Loc --      Pain Edu? --      Excl. in GC? --      Constitutional: Alert and oriented. Well appearing and in no distress. Eyes: Conjunctivae are normal. PERRL. Normal extraocular movements. ENT   Head: Normocephalic and atraumatic.   Nose: No congestion/rhinnorhea.   Mouth/Throat: Mucous membranes are moist.   Neck: No stridor. Hematological/Lymphatic/Immunilogical: No cervical lymphadenopathy. Cardiovascular: Normal rate, regular rhythm. Normal and symmetric distal pulses are present in all extremities. No murmurs, rubs, or gallops. Respiratory: Normal respiratory effort without tachypnea nor retractions. Breath sounds are clear and equal bilaterally. No wheezes/rales/rhonchi. Gastrointestinal: Soft and nontender. No distention. Positive for Right CVA tenderness. Genitourinary: deferred Musculoskeletal: Nontender with normal range of motion in all extremities. No joint effusions.  No lower extremity tenderness nor edema. Neurologic:  Normal speech and language. No gross focal neurologic deficits are appreciated. Speech is normal.  Skin:  Skin is warm, dry and intact. No rash noted. Psychiatric: Mood and affect are normal. Speech and behavior are normal. Patient exhibits appropriate insight and judgment.  ____________________________________________    LABS (pertinent positives/negatives)  Labs Reviewed  TROPONIN I - Abnormal; Notable for the following:    Troponin I 0.18 (*)    All other components within normal limits  CBC WITH DIFFERENTIAL/PLATELET - Abnormal; Notable for the following:    WBC 13.7 (*)    Neutro Abs 10.2 (*)    Monocytes Absolute 1.2 (*)    All other components within normal limits  BASIC METABOLIC PANEL - Abnormal; Notable for the following:    Glucose, Bld 100 (*)    Calcium 8.7 (*)    All other components within normal limits  CK - Abnormal; Notable for the following:    Total CK 26 (*)    All other components within normal limits  URINALYSIS COMPLETEWITH MICROSCOPIC ([facility] ONLY) -  Abnormal; Notable for the following:    Color, Urine YELLOW (*)    APPearance HAZY (*)    Hgb urine dipstick 1+ (*)    Protein, ur 30 (*)    Leukocytes, UA 3+ (*)    Bacteria, UA MANY (*)    Squamous Epithelial / LPF 6-30 (*)    All other components within normal limits  TROPONIN I - Abnormal; Notable for the following:    Troponin I 0.15 (*)    All other components within normal limits  URINE CULTURE         RADIOLOGY  US Venous Img Upper Uni Right (Final result) Result time: 10/10/15 00:01:53   Final result by Rad Results In Interface (10/10/15 00:01:53)  Narrative:   CLINICAL DATA: 55 year old female with generalized malaise and right hand and right wrist pain.  EXAM: Right UPPER EXTREMITY VENOUS DOPPLER ULTRASOUND  TECHNIQUE: Gray-scale sonography with graded compression, as well as color Doppler and duplex ultrasound were performed to evaluate the upper extremity deep venous system from the level of the subclavian vein and including the jugular, axillary, basilic, radial, ulnar and upper cephalic vein. Spectral Doppler was utilized to evaluate flow at rest and with distal augmentation maneuvers.  COMPARISON: None.  FINDINGS: Contralateral Subclavian Vein: Respiratory phasicity is normal and symmetric with the symptomatic side. No evidence of thrombus. Normal compressibility.  Internal Jugular Vein: No evidence of thrombus. Normal compressibility, respiratory phasicity and response to augmentation.  Subclavian Vein: No evidence of thrombus. Normal compressibility, respiratory phasicity and response to augmentation.  Axillary Vein: No evidence of thrombus. Normal compressibility, respiratory phasicity and response to augmentation.  Cephalic Vein: No evidence of thrombus. Normal compressibility, respiratory phasicity and response to augmentation.  Basilic Vein: No evidence of thrombus. Normal compressibility, respiratory phasicity and response to  augmentation.  Brachial Veins: No evidence of thrombus. Normal compressibility, respiratory phasicity and response to augmentation.  Radial Veins: No evidence of thrombus. Normal compressibility, respiratory phasicity and response to augmentation.  Ulnar Veins: No evidence of thrombus. Normal compressibility, respiratory phasicity and response to augmentation.  Venous Reflux: None visualized.  Other Findings: None visualized.  IMPRESSION: No evidence of deep venous thrombosis in the right upper extremity.   Electronically Signed By: Elgie Collard M.D. On: 10/10/2015 00:01          DG Chest 1 View (Final result) Result time: 10/09/15 22:57:45   Final result by Rad Results In Interface (10/09/15 22:57:45)   Narrative:   CLINICAL DATA: 55 year old female with shortness of breath  EXAM: CHEST 1 VIEW  COMPARISON: Chest radiograph dated 09/27/2015  FINDINGS: The heart size and mediastinal contours are within normal limits. Both lungs are clear. The visualized skeletal structures are unremarkable.  IMPRESSION: No active disease.   Electronically Signed By: Elgie Collard M.D. On: 10/09/2015 22:57         INITIAL IMPRESSION / ASSESSMENT AND PLAN / ED COURSE  Pertinent labs & imaging results that were available during my care of the patient were reviewed by me and considered in my medical decision making (see chart for details).  History physical exam consistent with urinary tract infection. Patient's troponin 0.18 with a troponin 0.15 suspect this to be trending down from the patient's myocardial infarction. Patient received Keflex in the emergency department will be prescribed same at home ____________________________________________   FINAL CLINICAL IMPRESSION(S) / ED DIAGNOSES  Final diagnoses:  UTI (lower urinary tract infection)      Darci Current, MD 10/10/15 [phone]

## 2015-10-09 NOTE — ED Notes (Signed)
EKG done at this time

## 2015-10-09 NOTE — ED Notes (Signed)
Pt presents with sob, dizziness, headache, nausea, bodyaches, lack of appetite for 1 week. Pt states this has been going on since we discharge on the 6/10 and placed her on meds. Hx of MI and stents 2 weeks. Pt woke up this am with right hand pain continued to get worse today. Noted swelling to right hand. Pt denies chest pain at this time.

## 2015-10-10 LAB — CK: Total CK: 26 U/L — ABNORMAL LOW (ref 38–234)

## 2015-10-10 LAB — TROPONIN I: Troponin I: 0.15 ng/mL — ABNORMAL HIGH (ref <0.031)

## 2015-10-10 MED ORDER — CEPHALEXIN 500 MG PO CAPS: 500.0000 mg | ORAL_CAPSULE | Freq: Two times a day (BID) | ORAL | Status: AC

## 2015-10-10 MED ORDER — CEPHALEXIN 500 MG PO CAPS
500.0000 mg | ORAL_CAPSULE | Freq: Once | ORAL | Status: AC
  Administered 2015-10-10: 500 mg via ORAL
  Filled 2015-10-10: qty 1

## 2015-10-10 NOTE — Discharge Instructions (Signed)

## 2015-10-12 LAB — URINE CULTURE: Culture: >=100000 — AB

## 2015-10-30 ENCOUNTER — Ambulatory Visit: Payer: Self-pay | Admitting: Family Medicine

## 2015-11-01 ENCOUNTER — Encounter: Payer: Self-pay | Admitting: Family Medicine

## 2015-11-01 ENCOUNTER — Ambulatory Visit (INDEPENDENT_AMBULATORY_CARE_PROVIDER_SITE_OTHER): Payer: Medicaid Other | Admitting: Family Medicine

## 2015-11-01 VITALS — BP 127/79 | HR 48 | Temp 98.2°F | Ht 65.5 in | Wt 157.0 lb

## 2015-11-01 DIAGNOSIS — I2510 Atherosclerotic heart disease of native coronary artery without angina pectoris: Secondary | ICD-10-CM | POA: Diagnosis not present

## 2015-11-01 DIAGNOSIS — N3000 Acute cystitis without hematuria: Secondary | ICD-10-CM | POA: Diagnosis not present

## 2015-11-01 DIAGNOSIS — F329 Major depressive disorder, single episode, unspecified: Secondary | ICD-10-CM | POA: Diagnosis not present

## 2015-11-01 DIAGNOSIS — I10 Essential (primary) hypertension: Secondary | ICD-10-CM | POA: Diagnosis not present

## 2015-11-01 DIAGNOSIS — R5382 Chronic fatigue, unspecified: Secondary | ICD-10-CM | POA: Diagnosis not present

## 2015-11-01 DIAGNOSIS — R001 Bradycardia, unspecified: Secondary | ICD-10-CM | POA: Diagnosis not present

## 2015-11-01 DIAGNOSIS — F32A Depression, unspecified: Secondary | ICD-10-CM

## 2015-11-01 DIAGNOSIS — Z1239 Encounter for other screening for malignant neoplasm of breast: Secondary | ICD-10-CM | POA: Diagnosis not present

## 2015-11-01 DIAGNOSIS — Z1211 Encounter for screening for malignant neoplasm of colon: Secondary | ICD-10-CM

## 2015-11-01 DIAGNOSIS — Z113 Encounter for screening for infections with a predominantly sexual mode of transmission: Secondary | ICD-10-CM | POA: Diagnosis not present

## 2015-11-01 DIAGNOSIS — I2111 ST elevation (STEMI) myocardial infarction involving right coronary artery: Secondary | ICD-10-CM

## 2015-11-01 DIAGNOSIS — I129 Hypertensive chronic kidney disease with stage 1 through stage 4 chronic kidney disease, or unspecified chronic kidney disease: Secondary | ICD-10-CM

## 2015-11-01 DIAGNOSIS — E785 Hyperlipidemia, unspecified: Secondary | ICD-10-CM | POA: Insufficient documentation

## 2015-11-01 MED ORDER — SERTRALINE HCL 50 MG PO TABS: ORAL_TABLET | ORAL | Status: DC

## 2015-11-01 MED ORDER — METOPROLOL TARTRATE 50 MG PO TABS: 25.0000 mg | ORAL_TABLET | Freq: Two times a day (BID) | ORAL | Status: DC

## 2015-11-01 MED ORDER — CIPROFLOXACIN HCL 250 MG PO TABS
250.0000 mg | ORAL_TABLET | Freq: Two times a day (BID) | ORAL | Status: DC
Start: 2015-11-01 — End: 2015-11-15

## 2015-11-01 MED ORDER — TRAZODONE HCL 50 MG PO TABS: 25.0000 mg | ORAL_TABLET | Freq: Every evening | ORAL | Status: DC | PRN

## 2015-11-01 NOTE — Patient Instructions (Signed)
Suicidal Feelings: How to Help Yourself  Suicide is the taking of one's own life. If you feel as though life is getting too tough to handle and are thinking about suicide, get help right away. To get help:  · Call your local emergency services (911 in the [place]  · Call a suicide hotline to speak with a trained counselor who understands how you are feeling. The following is a list of suicide hotlines in the [place]. For a list of hotlines in [place], visit www.suicide.org/hotlines/international/[place]-suicide-hotlines.html.  ·  1-800-273-TALK (1-800-273-8255).  ·  1-800-SUICIDE (1-800-784-2433).  ·  1-888-628-9454. This is a hotline for Spanish speakers.  ·  1-800-799-4TTY (1-800-799-4889). This is a hotline for TTY users.  ·  1-866-4-U-TREVOR (1-866-488-7386). This is a hotline for lesbian, gay, bisexual, transgender, or questioning youth.  · Contact a crisis center or a local suicide prevention center. To find a crisis center or suicide prevention center:  · Call your local hospital, clinic, community service organization, mental health center, social service provider, or health department. Ask for assistance in connecting to a crisis center.  · Visit www.suicidepreventionlifeline.org/getinvolved/locator for a list of crisis centers in the [place], or visit www.suicideprevention.ca/thinking-about-suicide/find-a-crisis-centre for a list of centers in [place].  · Visit the following websites:  ·  National Suicide Prevention Lifeline: www.suicidepreventionlifeline.org  ·  Hopeline: www.hopeline.com  ·  American Foundation for Suicide Prevention: www.afsp.org  ·  The Trevor Project (for lesbian, gay, bisexual, transgender, or questioning youth): www.thetrevorproject.org  HOW CAN I HELP MYSELF FEEL BETTER?  · Promise yourself that you will not do anything drastic when you have suicidal feelings. Remember, there is hope. Many people have gotten through suicidal thoughts and feelings, and you will, too. You may  have gotten through them before, and this proves that you can get through them again.  · Let family, friends, teachers, or counselors know how you are feeling. Try not to isolate yourself from those who care about you. Remember, they will want to help you. Talk with someone every day, even if you do not feel sociable. Face-to-face conversation is best.  · Call a mental health professional and see one regularly.  · Visit your primary health care provider every year.  · Eat a well-balanced diet, and space your meals so you eat regularly.  · Get plenty of rest.  · Avoid alcohol and drugs, and remove them from your home. They will only make you feel worse.  · If you are thinking of taking a lot of medicine, give your medicine to someone who can give it to you one day at a time. If you are on antidepressants and are concerned you will overdose, let your health care provider know so he or she can give you safer medicines. Ask your mental health professional about the possible side effects of any medicines you are taking.  · Remove weapons, poisons, knives, and anything else that could harm you from your home.  · Try to stick to routines. Follow a schedule every day. Put self-care on your schedule.  · Make a list of realistic goals, and cross them off when you achieve them. Accomplishments give a sense of worth.  · Wait until you are feeling better before doing the things you find difficult or unpleasant.  · Exercise if you are able. You will feel better if you exercise for even a half hour each day.  · Go out in the sun or into nature. This will help you recover from depression faster. If you have a favorite place to   walk, go there.  · Do the things that have always given you pleasure. Play your favorite music, read a good book, paint a picture, play your favorite instrument, or do anything else that takes your mind off your depression if it is safe to do.  · Keep your living space well lit.  · When you are feeling well,  write yourself a letter about tips and support that you can read when you are not feeling well.  · Remember that life's difficulties can be sorted out with help. Conditions can be treated. You can work on thoughts and strategies that serve you well.     This information is not intended to replace advice given to you by your health care provider. Make sure you discuss any questions you have with your health care provider.     Document Released: 10/12/2002 Document Revised: 04/28/2014 Document Reviewed: 08/02/2013  Elsevier Interactive Patient Education ©2016 [employer]  Major Depressive Disorder  Major depressive disorder is a mental illness. It also may be called clinical depression or unipolar depression. Major depressive disorder usually causes feelings of sadness, hopelessness, or helplessness. Some people with this disorder do not feel particularly sad but lose interest in doing things they used to enjoy (anhedonia). Major depressive disorder also can cause physical symptoms. It can interfere with work, school, relationships, and other normal everyday activities. The disorder varies in severity but is longer lasting and more serious than the sadness we all feel from time to time in our lives.  Major depressive disorder often is triggered by stressful life events or major life changes. Examples of these triggers include divorce, loss of your job or home, a move, and the death of a family member or close friend. Sometimes this disorder occurs for no obvious reason at all. People who have family members with major depressive disorder or bipolar disorder are at higher risk for developing this disorder, with or without life stressors. Major depressive disorder can occur at any age. It may occur just once in your life (single episode major depressive disorder). It may occur multiple times (recurrent major depressive disorder).  SYMPTOMS  People with major depressive disorder have either anhedonia or depressed mood  on nearly a daily basis for at least 2 weeks or longer. Symptoms of depressed mood include:  · Feelings of sadness (blue or down in the dumps) or emptiness.  · Feelings of hopelessness or helplessness.  · Tearfulness or episodes of crying (may be observed by others).  · Irritability (children and adolescents).  In addition to depressed mood or anhedonia or both, people with this disorder have at least four of the following symptoms:  · Difficulty sleeping or sleeping too much.    · Significant change (increase or decrease) in appetite or weight.    · Lack of energy or motivation.  · Feelings of guilt and worthlessness.    · Difficulty concentrating, remembering, or making decisions.  · Unusually slow movement (psychomotor retardation) or restlessness (as observed by others).    · Recurrent wishes for death, recurrent thoughts of self-harm (suicide), or a suicide attempt.  People with major depressive disorder commonly have persistent negative thoughts about themselves, other people, and the world. People with severe major depressive disorder may experience distorted beliefs or perceptions about the world (psychotic delusions). They also may see or hear things that are not real (psychotic hallucinations).  DIAGNOSIS  Major depressive disorder is diagnosed through an assessment by your health care provider. Your health care provider will ask about aspects of   your daily life, such as mood, sleep, and appetite, to see if you have the diagnostic symptoms of major depressive disorder. Your health care provider may ask about your medical history and use of alcohol or drugs, including prescription medicines. Your health care provider also may do a physical exam and blood work. This is because certain medical conditions and the use of certain substances can cause major depressive disorder-like symptoms (secondary depression). Your health care provider also may refer you to a mental health specialist for further evaluation  and treatment.  TREATMENT  It is important to recognize the symptoms of major depressive disorder and seek treatment. The following treatments can be prescribed for this disorder:    · Medicine. Antidepressant medicines usually are prescribed. Antidepressant medicines are thought to correct chemical imbalances in the brain that are commonly associated with major depressive disorder. Other types of medicine may be added if the symptoms do not respond to antidepressant medicines alone or if psychotic delusions or hallucinations occur.  · Talk therapy. Talk therapy can be helpful in treating major depressive disorder by providing support, education, and guidance. Certain types of talk therapy also can help with negative thinking (cognitive behavioral therapy) and with relationship issues that trigger this disorder (interpersonal therapy).  A mental health specialist can help determine which treatment is best for you. Most people with major depressive disorder do well with a combination of medicine and talk therapy. Treatments involving electrical stimulation of the brain can be used in situations with extremely severe symptoms or when medicine and talk therapy do not work over time. These treatments include electroconvulsive therapy, transcranial magnetic stimulation, and vagal nerve stimulation.     This information is not intended to replace advice given to you by your health care provider. Make sure you discuss any questions you have with your health care provider.     Document Released: 08/02/2012 Document Revised: 04/28/2014 Document Reviewed: 08/02/2012  Elsevier Interactive Patient Education ©2016 Else[employer] Inc[employer]

## 2015-11-01 NOTE — Assessment & Plan Note (Signed)
Possibly exacerbated by bradycardia. Will treat with zoloft and trazodone for sleep. Call with concerns. Check back in in 2 weeks. Risks and benefits discussed today.

## 2015-11-01 NOTE — Assessment & Plan Note (Signed)
Referral for cardiology made today. EKG done normal today except for bradycardia.

## 2015-11-01 NOTE — Assessment & Plan Note (Signed)
On ACE. Under good control. Continue current regimen. Continue to monitor.

## 2015-11-01 NOTE — Progress Notes (Signed)
BP 127/79 mmHg  Pulse 48  Temp(Src) 98.2 F (36.8 C)  Ht 5' 5.5" (1.664 m)  Wt 157 lb (71.215 kg)  BMI 25.72 kg/m2  SpO2 99%  LMP 10/30/2015   Subjective:    Patient ID: Natalie Taylor, female    DOB: 06/15/1960, 55 y.o.   MRN: [id number]  HPI: Natalie Bunnelleggy C Curfman is a 55 y.o. female  Chief Complaint  Patient presents with  . Hospitalization Follow-up  . Insomnia  . Fatigue   HOSPITAL FOLLOW UP Time since discharge: 1 month Hospital/facility: [facility] Diagnosis: STEMI  Procedures/tests: DES stent placed in 100% occluded RCA  Consultants: Cardiology New medications:  ASA, brintella, lisinopril and metoprolol and statin Discharge instructions: Follow up with cardiology and here- has not done either since d/c Status: Still feeling terrible- very nauseous  ER FOLLOW UP Time since discharge: 3 weeks Hospital/facility: [facility] Diagnosis: UTI Procedures/tests: EKG, labs, UE US Consultants: None New medications: keflex Discharge instructions: follow up here  Status: No better  FATIGUE Duration:  Since getting out of the hospital Severity: severe  Onset: sudden Context when symptoms started:  Had STEMI Symptoms improve with rest: no  Depressive symptoms: yes Stress/anxiety: yes Insomnia: yes hard to fall asleep Snoring: no Observed apnea by bed partner: no Daytime hypersomnolence:yes Wakes feeling refreshed: no History of sleep study: no Dysnea on exertion:  yes Orthopnea/PND: no Chest pain: yes Chronic cough: no Lower extremity edema: yes- since getting the artery taken out Arthralgias:yes Myalgias: yes Weakness: yes Rash: no  HYPERTENSION / HYPERLIPIDEMIA Satisfied with current treatment? no Duration of hypertension: chronic BP monitoring frequency: not checking BP medication side effects: yes- nausea Duration of hyperlipidemia: unknown Cholesterol medication side effects: unknown Cholesterol supplements: none Past cholesterol medications: lipitor Medication  compliance: excellent compliance Aspirin: yes Recent stressors: yes Recurrent headaches: no Visual changes: no Palpitations: no Dyspnea: yes Chest pain: yes Lower extremity edema: no Dizzy/lightheaded: yes  DEPRESSION/ANXIETY Mood status: uncontrolled Satisfied with current treatment?: no Symptom severity: severe  Duration of current treatment : Not on anything Psychotherapy/counseling: no  Previous psychiatric medications: none Depressed mood: yes Anxious mood: yes Anhedonia: no Significant weight loss or gain: yes Insomnia: yes hard to fall asleep Fatigue: yes Feelings of worthlessness or guilt: yes Impaired concentration/indecisiveness: yes Suicidal ideations: yes- no plan Hopelessness: yes Crying spells: yes Depression screen PHQ 2/9 11/01/2015  Decreased Interest 3  Down, Depressed, Hopeless 3  PHQ - 2 Score 6  Altered sleeping 3  Tired, decreased energy 3  Change in appetite 3  Feeling bad or failure about yourself  3  Trouble concentrating 3  Moving slowly or fidgety/restless 2  Suicidal thoughts 1  PHQ-9 Score 24  Difficult doing work/chores Extremely dIfficult   GAD 7 : Generalized Anxiety Score 11/01/2015  Nervous, Anxious, on Edge 3  Control/stop worrying 3  Worry too much - different things 3  Trouble relaxing 3  Restless 3  Easily annoyed or irritable 3  Afraid - awful might happen 3  Total GAD 7 Score 21  Anxiety Difficulty Very difficult   Relevant past medical, surgical, family and social history reviewed and updated as indicated. Interim medical history since our last visit reviewed. Allergies and medications reviewed and updated.  Review of Systems  Constitutional: Positive for activity change, appetite change, fatigue and unexpected weight change. Negative for fever and chills.  Respiratory: Positive for shortness of breath. Negative for apnea, cough, choking, chest tightness, wheezing and stridor.   Cardiovascular: Positive for chest pain  and leg swelling. Negative for palpitations.  Gastrointestinal: Positive for nausea, abdominal pain and constipation. Negative for vomiting, diarrhea, blood in stool, abdominal distention, anal bleeding and rectal pain.  Musculoskeletal: Positive for myalgias and arthralgias. Negative for back pain, joint swelling, gait problem, neck pain and neck stiffness.  Neurological: Negative.   Psychiatric/Behavioral: Positive for suicidal ideas, sleep disturbance, dysphoric mood and decreased concentration. Negative for hallucinations, behavioral problems, confusion, self-injury and agitation. The patient is nervous/anxious. The patient is not hyperactive.     Per HPI unless specifically indicated above     Objective:    BP 127/79 mmHg  Pulse 48  Temp(Src) 98.2 F (36.8 C)  Ht 5' 5.5" (1.664 m)  Wt 157 lb (71.215 kg)  BMI 25.72 kg/m2  SpO2 99%  LMP 10/30/2015  Wt Readings from Last 3 Encounters:  11/01/15 157 lb (71.215 kg)  10/09/15 140 lb (63.504 kg)  09/27/15 174 lb 9.7 oz (79.2 kg)    Physical Exam  Constitutional: She is oriented to person, place, and time. She appears well-developed and well-nourished. No distress.  HENT:  Head: Normocephalic and atraumatic.  Right Ear: Hearing normal.  Left Ear: Hearing normal.  Nose: Nose normal.  Eyes: Conjunctivae and lids are normal. Right eye exhibits no discharge. Left eye exhibits no discharge. No scleral icterus.  Cardiovascular: Regular rhythm, normal heart sounds and intact distal pulses.  Bradycardia present.  Exam reveals no gallop and no friction rub.   No murmur heard. Pulmonary/Chest: Effort normal and breath sounds normal. No respiratory distress. She has no wheezes. She has no rales. She exhibits no tenderness.  Musculoskeletal: Normal range of motion.  Neurological: She is alert and oriented to person, place, and time.  Skin: Skin is warm, dry and intact. No rash noted. She is not diaphoretic. No erythema. No pallor.   Psychiatric: Her speech is normal and behavior is normal. Judgment and thought content normal. Cognition and memory are normal. She exhibits a depressed mood.  Tearful at times during the interview  Nursing note and vitals reviewed.   Results for orders placed or performed during the hospital encounter of 10/09/15  Urine culture  Result Value Ref Range   Specimen Description URINE, RANDOM    Special Requests NONE    Culture >=100,000 COLONIES/mL ESCHERICHIA COLI (A)    Report Status 10/12/2015 FINAL    Organism ID, Bacteria ESCHERICHIA COLI (A)       Susceptibility   Escherichia coli - MIC*    AMPICILLIN >=32 RESISTANT Resistant     CEFAZOLIN 8 SENSITIVE Sensitive     CEFTRIAXONE <=1 SENSITIVE Sensitive     CIPROFLOXACIN <=0.25 SENSITIVE Sensitive     GENTAMICIN <=1 SENSITIVE Sensitive     IMIPENEM <=0.25 SENSITIVE Sensitive     NITROFURANTOIN <=16 SENSITIVE Sensitive     TRIMETH/SULFA <=20 SENSITIVE Sensitive     AMPICILLIN/SULBACTAM >=32 RESISTANT Resistant     PIP/TAZO <=4 SENSITIVE Sensitive     Extended ESBL NEGATIVE Sensitive     * >=100,000 COLONIES/mL ESCHERICHIA COLI  Troponin I  Result Value Ref Range   Troponin I 0.18 (H) <0.031 ng/mL  CBC with Differential  Result Value Ref Range   WBC 13.7 (H) 3.6 - 11.0 K/uL   RBC 4.41 3.80 - 5.20 MIL/uL   Hemoglobin 13.1 12.0 - 16.0 g/dL   HCT 60.4 54.0 - 98.1 %   MCV 87.0 80.0 - 100.0 fL   MCH 29.7 26.0 - 34.0 pg   MCHC 34.1 32.0 -  36.0 g/dL   RDW 11.9 14.7 - 82.9 %   Platelets 300 150 - 440 K/uL   Neutrophils Relative % 74% %   Neutro Abs 10.2 (H) 1.4 - 6.5 K/uL   Lymphocytes Relative 14% %   Lymphs Abs 1.9 1.0 - 3.6 K/uL   Monocytes Relative 9% %   Monocytes Absolute 1.2 (H) 0.2 - 0.9 K/uL   Eosinophils Relative 2% %   Eosinophils Absolute 0.3 0 - 0.7 K/uL   Basophils Relative 1% %   Basophils Absolute 0.1 0 - 0.1 K/uL  Basic metabolic panel  Result Value Ref Range   Sodium 137 135 - 145 mmol/L   Potassium 3.5  3.5 - 5.1 mmol/L   Chloride 105 101 - 111 mmol/L   CO2 23 22 - 32 mmol/L   Glucose, Bld 100 (H) 65 - 99 mg/dL   BUN 11 6 - 20 mg/dL   Creatinine, Ser 5.62 0.44 - 1.00 mg/dL   Calcium 8.7 (L) 8.9 - 10.3 mg/dL   GFR calc non Af Amer >60 >60 mL/min   GFR calc Af Amer >60 >60 mL/min   Anion gap 9 5 - 15  CK  Result Value Ref Range   Total CK 26 (L) 38 - 234 U/L  Urinalysis complete, with microscopic ([facility] only)  Result Value Ref Range   Color, Urine YELLOW (A) YELLOW   APPearance HAZY (A) CLEAR   Glucose, UA NEGATIVE NEGATIVE mg/dL   Bilirubin Urine NEGATIVE NEGATIVE   Ketones, ur NEGATIVE NEGATIVE mg/dL   Specific Gravity, Urine 1.009 1.005 - 1.030   Hgb urine dipstick 1+ (A) NEGATIVE   pH 7.0 5.0 - 8.0   Protein, ur 30 (A) NEGATIVE mg/dL   Nitrite NEGATIVE NEGATIVE   Leukocytes, UA 3+ (A) NEGATIVE   RBC / HPF 0-5 0 - 5 RBC/hpf   WBC, UA 6-30 0 - 5 WBC/hpf   Bacteria, UA MANY (A) NONE SEEN   Squamous Epithelial / LPF 6-30 (A) NONE SEEN   Mucous PRESENT   Troponin I  Result Value Ref Range   Troponin I 0.15 (H) <0.031 ng/mL      Assessment & Plan:   Problem List Items Addressed This Visit      Cardiovascular and Mediastinum   ST elevation (STEMI) myocardial infarction involving right coronary artery [facility])    Referral for cardiology made today. EKG done normal today except for bradycardia.       Relevant Medications   metoprolol (LOPRESSOR) 50 MG tablet   Other Relevant Orders   EKG 12-Lead (Completed)     Genitourinary   Benign hypertensive renal disease    On ACE. Under good control. Continue current regimen. Continue to monitor.         Other   HLD (hyperlipidemia)    Rechecking levels today as well as LFTs. Await results.      Relevant Medications   metoprolol (LOPRESSOR) 50 MG tablet   Other Relevant Orders   Comprehensive metabolic panel   Lipid Panel w/o Chol/HDL Ratio   Depression    Possibly exacerbated by bradycardia. Will treat with zoloft and  trazodone for sleep. Call with concerns. Check back in in 2 weeks. Risks and benefits discussed today.      Relevant Medications   sertraline (ZOLOFT) 50 MG tablet   traZODone (DESYREL) 50 MG tablet    Other Visit Diagnoses    Coronary artery disease involving native coronary artery of native heart without angina pectoris    -  Primary    Relevant Medications    metoprolol (LOPRESSOR) 50 MG tablet    Other Relevant Orders    CBC with Differential/Platelet    Comprehensive metabolic panel    TSH    Microalbumin, Urine Waived    UA/M w/rflx Culture, Routine    Lipid Panel w/o Chol/HDL Ratio    Ambulatory referral to Cardiology    Screening for STD (sexually transmitted disease)        Labs checked today. Await results    Relevant Orders    GC/Chlamydia Probe Amp    Hepatitis C antibody    HSV(herpes simplex vrs) 1+2 ab-IgG    HIV antibody    RPR    Screening for breast cancer        Mammogram ordered today    Relevant Orders    MM Digital Screening    Screening for colon cancer        Referral for GI made today.    Relevant Orders    Ambulatory referral to General Surgery    Chronic fatigue        Likely due to bradycardia and depression. beta-blocker decreased and zoloft started. Labs checked today.    Relevant Orders    CBC with Differential/Platelet    Comprehensive metabolic panel    TSH    Acute cystitis without hematuria        Treated with restistant Abx last time. Will treat with cipro. Rx given today. Recheck UA next visit.    Relevant Orders    UA/M w/rflx Culture, Routine    Bradycardia        Will decrease metoprolol to 25mg  daily and recheck in 2 weeks        Follow up plan: Return 2 weeks for follow up on HR and depression, for 4 weeks for physical and depression.

## 2015-11-01 NOTE — Assessment & Plan Note (Signed)
Rechecking levels today as well as LFTs. Await results.

## 2015-11-02 ENCOUNTER — Telehealth: Payer: Self-pay

## 2015-11-02 ENCOUNTER — Encounter: Payer: Self-pay | Admitting: Emergency Medicine

## 2015-11-02 ENCOUNTER — Other Ambulatory Visit: Payer: Self-pay

## 2015-11-02 ENCOUNTER — Emergency Department: Payer: Medicaid Other

## 2015-11-02 ENCOUNTER — Emergency Department
Admission: EM | Admit: 2015-11-02 | Discharge: 2015-11-02 | Disposition: A | Discharge status: Home / Self Care | Payer: Medicaid Other | Attending: Emergency Medicine | Admitting: Emergency Medicine

## 2015-11-02 DIAGNOSIS — F410 Panic disorder [episodic paroxysmal anxiety] without agoraphobia: Secondary | ICD-10-CM | POA: Diagnosis not present

## 2015-11-02 DIAGNOSIS — Z79899 Other long term (current) drug therapy: Secondary | ICD-10-CM | POA: Diagnosis not present

## 2015-11-02 DIAGNOSIS — M1990 Unspecified osteoarthritis, unspecified site: Secondary | ICD-10-CM | POA: Diagnosis not present

## 2015-11-02 DIAGNOSIS — I10 Essential (primary) hypertension: Secondary | ICD-10-CM | POA: Insufficient documentation

## 2015-11-02 DIAGNOSIS — I2510 Atherosclerotic heart disease of native coronary artery without angina pectoris: Secondary | ICD-10-CM | POA: Diagnosis not present

## 2015-11-02 DIAGNOSIS — R0789 Other chest pain: Secondary | ICD-10-CM | POA: Diagnosis not present

## 2015-11-02 DIAGNOSIS — Z7982 Long term (current) use of aspirin: Secondary | ICD-10-CM | POA: Diagnosis not present

## 2015-11-02 DIAGNOSIS — R079 Chest pain, unspecified: Secondary | ICD-10-CM

## 2015-11-02 DIAGNOSIS — F17210 Nicotine dependence, cigarettes, uncomplicated: Secondary | ICD-10-CM | POA: Insufficient documentation

## 2015-11-02 DIAGNOSIS — R531 Weakness: Secondary | ICD-10-CM | POA: Diagnosis present

## 2015-11-02 DIAGNOSIS — E785 Hyperlipidemia, unspecified: Secondary | ICD-10-CM | POA: Insufficient documentation

## 2015-11-02 DIAGNOSIS — I252 Old myocardial infarction: Secondary | ICD-10-CM | POA: Diagnosis not present

## 2015-11-02 LAB — CBC WITH DIFFERENTIAL/PLATELET
Basophils Absolute: 0.1 10*3/uL (ref 0.0–0.2)
Basos: 1 %
EOS (ABSOLUTE): 0.8 10*3/uL — ABNORMAL HIGH (ref 0.0–0.4)
Eos: 7 %
Hematocrit: 40.6 % (ref 34.0–46.6)
Hemoglobin: 13 g/dL (ref 11.1–15.9)
Immature Grans (Abs): 0 10*3/uL (ref 0.0–0.1)
Immature Granulocytes: 0 %
Lymphocytes Absolute: 3.2 10*3/uL — ABNORMAL HIGH (ref 0.7–3.1)
Lymphs: 28 %
MCH: 28.6 pg (ref 26.6–33.0)
MCHC: 32 g/dL (ref 31.5–35.7)
MCV: 89 fL (ref 79–97)
Monocytes Absolute: 0.9 10*3/uL (ref 0.1–0.9)
Monocytes: 8 %
Neutrophils Absolute: 6.3 10*3/uL (ref 1.4–7.0)
Neutrophils: 56 %
Platelets: 314 10*3/uL (ref 150–379)
RBC: 4.54 x10E6/uL (ref 3.77–5.28)
RDW: 14.1 % (ref 12.3–15.4)
WBC: 11.4 10*3/uL — ABNORMAL HIGH (ref 3.4–10.8)

## 2015-11-02 LAB — COMPREHENSIVE METABOLIC PANEL
ALT: 12 IU/L (ref 0–32)
ALT: 13 IU/L (ref 0–32)
AST: 11 IU/L (ref 0–40)
AST: 13 IU/L (ref 0–40)
Albumin/Globulin Ratio: 1.1 — ABNORMAL LOW (ref 1.2–2.2)
Albumin/Globulin Ratio: 1.1 — ABNORMAL LOW (ref 1.2–2.2)
Albumin: 3.8 g/dL (ref 3.5–5.5)
Albumin: 3.9 g/dL (ref 3.5–5.5)
Alkaline Phosphatase: 139 IU/L — ABNORMAL HIGH (ref 39–117)
Alkaline Phosphatase: 140 IU/L — ABNORMAL HIGH (ref 39–117)
BUN/Creatinine Ratio: 12 (ref 9–23)
BUN/Creatinine Ratio: 12 (ref 9–23)
BUN: 11 mg/dL (ref 6–24)
BUN: 11 mg/dL (ref 6–24)
Bilirubin Total: 0.2 mg/dL (ref 0.0–1.2)
Bilirubin Total: 0.2 mg/dL (ref 0.0–1.2)
CO2: 22 mmol/L (ref 18–29)
CO2: 23 mmol/L (ref 18–29)
Calcium: 9.1 mg/dL (ref 8.7–10.2)
Calcium: 9.1 mg/dL (ref 8.7–10.2)
Chloride: 98 mmol/L (ref 96–106)
Chloride: 99 mmol/L (ref 96–106)
Creatinine, Ser: 0.89 mg/dL (ref 0.57–1.00)
Creatinine, Ser: 0.93 mg/dL (ref 0.57–1.00)
GFR calc Af Amer: 81 mL/min/{1.73_m2} (ref >59)
GFR calc Af Amer: 86 mL/min/{1.73_m2} (ref >59)
GFR calc non Af Amer: 70 mL/min/{1.73_m2} (ref >59)
GFR calc non Af Amer: 74 mL/min/{1.73_m2} (ref >59)
Globulin, Total: 3.6 g/dL (ref 1.5–4.5)
Globulin, Total: 3.7 g/dL (ref 1.5–4.5)
Glucose: 79 mg/dL (ref 65–99)
Glucose: 80 mg/dL (ref 65–99)
Potassium: 4 mmol/L (ref 3.5–5.2)
Potassium: 4 mmol/L (ref 3.5–5.2)
Sodium: 138 mmol/L (ref 134–144)
Sodium: 141 mmol/L (ref 134–144)
Total Protein: 7.4 g/dL (ref 6.0–8.5)
Total Protein: 7.6 g/dL (ref 6.0–8.5)

## 2015-11-02 LAB — HSV(HERPES SIMPLEX VRS) I + II AB-IGG
HSV 1 Glycoprotein G Ab, IgG: 15.3 index — ABNORMAL HIGH (ref 0.00–0.90)
HSV 1 Glycoprotein G Ab, IgG: 15.9 index — ABNORMAL HIGH (ref 0.00–0.90)
HSV 2 Glycoprotein G Ab, IgG: 5.6 index — ABNORMAL HIGH (ref 0.00–0.90)
HSV 2 Glycoprotein G Ab, IgG: 5.81 index — ABNORMAL HIGH (ref 0.00–0.90)

## 2015-11-02 LAB — CBC
HCT: 40.7 % (ref 35.0–47.0)
Hemoglobin: 13.5 g/dL (ref 12.0–16.0)
MCH: 28.6 pg (ref 26.0–34.0)
MCHC: 33.2 g/dL (ref 32.0–36.0)
MCV: 86.2 fL (ref 80.0–100.0)
Platelets: 239 10*3/uL (ref 150–440)
RBC: 4.72 MIL/uL (ref 3.80–5.20)
RDW: 13.9 % (ref 11.5–14.5)
WBC: 12.2 10*3/uL — ABNORMAL HIGH (ref 3.6–11.0)

## 2015-11-02 LAB — BASIC METABOLIC PANEL
Anion gap: 10 (ref 5–15)
BUN: 10 mg/dL (ref 6–20)
CO2: 21 mmol/L — ABNORMAL LOW (ref 22–32)
Calcium: 8.9 mg/dL (ref 8.9–10.3)
Chloride: 106 mmol/L (ref 101–111)
Creatinine, Ser: 0.87 mg/dL (ref 0.44–1.00)
GFR calc Af Amer: >60 mL/min (ref >60)
GFR calc non Af Amer: >60 mL/min (ref >60)
Glucose, Bld: 97 mg/dL (ref 65–99)
Potassium: 3.6 mmol/L (ref 3.5–5.1)
Sodium: 137 mmol/L (ref 135–145)

## 2015-11-02 LAB — HIV ANTIBODY (ROUTINE TESTING W REFLEX): HIV Screen 4th Generation wRfx: NONREACTIVE

## 2015-11-02 LAB — HEPATITIS C ANTIBODY: Hep C Virus Ab: <0.1 s/co ratio (ref 0.0–0.9)

## 2015-11-02 LAB — LIPID PANEL W/O CHOL/HDL RATIO
Cholesterol, Total: 103 mg/dL (ref 100–199)
Cholesterol, Total: 108 mg/dL (ref 100–199)
HDL: 30 mg/dL — ABNORMAL LOW (ref >39)
HDL: 31 mg/dL — ABNORMAL LOW (ref >39)
LDL Calculated: 47 mg/dL (ref 0–99)
LDL Calculated: 52 mg/dL (ref 0–99)
Triglycerides: 127 mg/dL (ref 0–149)
Triglycerides: 129 mg/dL (ref 0–149)
VLDL Cholesterol Cal: 25 mg/dL (ref 5–40)
VLDL Cholesterol Cal: 26 mg/dL (ref 5–40)

## 2015-11-02 LAB — TSH
TSH: 3.84 u[IU]/mL (ref 0.450–4.500)
TSH: 3.89 u[IU]/mL (ref 0.450–4.500)

## 2015-11-02 LAB — TROPONIN I
Troponin I: 0.04 ng/mL (ref <0.03)
Troponin I: 0.03 ng/mL (ref <0.03)

## 2015-11-02 LAB — RPR
RPR Ser Ql: NONREACTIVE
RPR Ser Ql: NONREACTIVE

## 2015-11-02 MED ORDER — IBUPROFEN 600 MG PO TABS
600.0000 mg | ORAL_TABLET | Freq: Once | ORAL | Status: AC
  Administered 2015-11-02: 600 mg via ORAL

## 2015-11-02 MED ORDER — IBUPROFEN 600 MG PO TABS
ORAL_TABLET | ORAL | Status: AC
  Administered 2015-11-02: 600 mg via ORAL
  Filled 2015-11-02: qty 1

## 2015-11-02 NOTE — Telephone Encounter (Signed)
Gastroenterology Pre-Procedure Review  Request Date: 11/19/2015  Requesting Physician: Dr. Laural BenesJohnson  PATIENT REVIEW QUESTIONS: The patient responded to the following health history questions as indicated:    1. Are you having any GI issues? yes (rectal bleeding due to constipation, stomach pain. getting worse) 2. Do you have a personal history of Polyps? no 3. Do you have a family history of Colon Cancer or Polyps? no 4. Diabetes Mellitus? no 5. Joint replacements in the past 12 months?no 6. Major health problems in the past 3 months?yes (heart surgery 09/2015) 7. Any artificial heart valves, MVP, or defibrillator?no    MEDICATIONS & ALLERGIES:    Patient reports the following regarding taking any anticoagulation/antiplatelet therapy:   Plavix, Coumadin, Eliquis, Xarelto, Lovenox, Pradaxa, Brilinta, or Effient? no Aspirin? yes (heart)  Patient confirms/reports the following medications:  Current Outpatient Prescriptions  Medication Sig Dispense Refill  . aspirin 81 MG chewable tablet Chew 1 tablet (81 mg total) by mouth daily. 30 tablet 3  . atorvastatin (LIPITOR) 80 MG tablet Take 1 tablet (80 mg total) by mouth daily at 6 PM. 30 tablet 3  . ciprofloxacin (CIPRO) 250 MG tablet Take 1 tablet (250 mg total) by mouth 2 (two) times daily. 14 tablet 0  . metoprolol (LOPRESSOR) 50 MG tablet Take 0.5 tablets (25 mg total) by mouth 2 (two) times daily. 60 tablet 3  . sertraline (ZOLOFT) 50 MG tablet 1/2 tab a day for 1 week, then increase to 1 tab daily until we see you 30 tablet 1  . ticagrelor (BRILINTA) 90 MG TABS tablet Take 1 tablet (90 mg total) by mouth 2 (two) times daily. 60 tablet 3  . traZODone (DESYREL) 50 MG tablet Take 0.5-1 tablets (25-50 mg total) by mouth at bedtime as needed for sleep. 30 tablet 0  . lisinopril (PRINIVIL,ZESTRIL) 10 MG tablet Take 1 tablet (10 mg total) by mouth every 12 (twelve) hours. (Patient not taking: Reported on 11/02/2015) 30 tablet 3   No current  facility-administered medications for this visit.    Patient confirms/reports the following allergies:  No Known Allergies  No orders of the defined types were placed in this encounter.    AUTHORIZATION INFORMATION Primary Insurance: 1D#: Group #:  Secondary Insurance: 1D#: Group #:  SCHEDULE INFORMATION: Date:  11/19/2015 Time: Location: [facility]

## 2015-11-02 NOTE — Discharge Instructions (Signed)
You were evaluated for dizziness followed by chest discomfort after being upset about the gastroenterology procedure discussion, which I suspect was a panic reaction. evaluation are reassuring today in emergency department.  Return to emergency department for any worsening condition including chest pain, palpitations, dizziness or passing out, confusion altered mental status, or any other symptoms concerning to you.   Nonspecific Chest Pain It is often hard to find the cause of chest pain. There is always a chance that your pain could be related to something serious, such as a heart attack or a blood clot in your lungs. Chest pain can also be caused by conditions that are not life-threatening. If you have chest pain, it is very important to follow up with your doctor.  HOME CARE  If you were prescribed an antibiotic medicine, finish it all even if you start to feel better.  Avoid any activities that cause chest pain.  Do not use any tobacco products, including cigarettes, chewing tobacco, or electronic cigarettes. If you need help quitting, ask your doctor.  Do not drink alcohol.  Take medicines only as told by your doctor.  Keep all follow-up visits as told by your doctor. This is important. This includes any further testing if your chest pain does not go away.  Your doctor may tell you to keep your head raised (elevated) while you sleep.  Make lifestyle changes as told by your doctor. These may include:  Getting regular exercise. Ask your doctor to suggest some activities that are safe for you.  Eating a heart-healthy diet. Your doctor or a diet specialist (dietitian) can help you to learn healthy eating options.  Maintaining a healthy weight.  Managing diabetes, if necessary.  Reducing stress. GET HELP IF:  Your chest pain does not go away, even after treatment.  You have a rash with blisters on your chest.  You have a fever. GET HELP RIGHT AWAY IF:  Your chest pain is  worse.  You have an increasing cough, or you cough up blood.  You have severe belly (abdominal) pain.  You feel extremely weak.  You pass out (faint).  You have chills.  You have sudden, unexplained chest discomfort.  You have sudden, unexplained discomfort in your arms, back, neck, or jaw.  You have shortness of breath at any time.  You suddenly start to sweat, or your skin gets clammy.  You feel nauseous.  You vomit.  You suddenly feel light-headed or dizzy.  Your heart begins to beat quickly, or it feels like it is skipping beats. These symptoms may be an emergency. Do not wait to see if the symptoms will go away. Get medical help right away. Call your local emergency services (911 in the [place] Do not drive yourself to the hospital.   This information is not intended to replace advice given to you by your health care provider. Make sure you discuss any questions you have with your health care provider.   Document Released: 09/24/2007 Document Revised: 04/28/2014 Document Reviewed: 11/11/2013 Elsevier Interactive Patient Education 2016 Elsevier Inc.   Panic Attacks Panic attacks are sudden, short-livedsurges of severe anxiety, fear, or discomfort. They may occur for no reason when you are relaxed, when you are anxious, or when you are sleeping. Panic attacks may occur for a number of reasons:   Healthy people occasionally have panic attacks in extreme, life-threatening situations, such as war or natural disasters. Normal anxiety is a protective mechanism of the body that helps [place] react to danger (  fight or flight response).  Panic attacks are often seen with anxiety disorders, such as panic disorder, social anxiety disorder, generalized anxiety disorder, and phobias. Anxiety disorders cause excessive or uncontrollable anxiety. They may interfere with your relationships or other life activities.  Panic attacks are sometimes seen with other mental illnesses, such as  depression and posttraumatic stress disorder.  Certain medical conditions, prescription medicines, and drugs of abuse can cause panic attacks. SYMPTOMS  Panic attacks start suddenly, peak within 20 minutes, and are accompanied by four or more of the following symptoms:  Pounding heart or fast heart rate (palpitations).  Sweating.  Trembling or shaking.  Shortness of breath or feeling smothered.  Feeling choked.  Chest pain or discomfort.  Nausea or strange feeling in your stomach.  Dizziness, light-headedness, or feeling like you will faint.  Chills or hot flushes.  Numbness or tingling in your lips or hands and feet.  Feeling that things are not real or feeling that you are not yourself.  Fear of losing control or going crazy.  Fear of dying. Some of these symptoms can mimic serious medical conditions. For example, you may think you are having a heart attack. Although panic attacks can be very scary, they are not life threatening. DIAGNOSIS  Panic attacks are diagnosed through an assessment by your health care provider. Your health care provider will ask questions about your symptoms, such as where and when they occurred. Your health care provider will also ask about your medical history and use of alcohol and drugs, including prescription medicines. Your health care provider may order blood tests or other studies to rule out a serious medical condition. Your health care provider may refer you to a mental health professional for further evaluation. TREATMENT   Most healthy people who have one or two panic attacks in an extreme, life-threatening situation will not require treatment.  The treatment for panic attacks associated with anxiety disorders or other mental illness typically involves counseling with a mental health professional, medicine, or a combination of both. Your health care provider will help determine what treatment is best for you.  Panic attacks due to  physical illness usually go away with treatment of the illness. If prescription medicine is causing panic attacks, talk with your health care provider about stopping the medicine, decreasing the dose, or substituting another medicine.  Panic attacks due to alcohol or drug abuse go away with abstinence. Some adults need professional help in order to stop drinking or using drugs. HOME CARE INSTRUCTIONS   Take all medicines as directed by your health care provider.   Schedule and attend follow-up visits as directed by your health care provider. It is important to keep all your appointments. SEEK MEDICAL CARE IF:  You are not able to take your medicines as prescribed.  Your symptoms do not improve or get worse. SEEK IMMEDIATE MEDICAL CARE IF:   You experience panic attack symptoms that are different than your usual symptoms.  You have serious thoughts about hurting yourself or others.  You are taking medicine for panic attacks and have a serious side effect. MAKE SURE YOU:  Understand these instructions.  Will watch your condition.  Will get help right away if you are not doing well or get worse.   This information is not intended to replace advice given to you by your health care provider. Make sure you discuss any questions you have with your health care provider.   Document Released: 04/07/2005 Document Revised: 04/12/2013 Document Reviewed:  11/19/2012 Elsevier Interactive Patient Education [employer]2016 Elsevier Inc.

## 2015-11-02 NOTE — ED Notes (Signed)
Pt called EMS for not feeling well and episode of chest pain with shortness of breath. Pt hyperventilating per EMS on their arrival. Pt now with unlabored and normal respirations. Reports does feel like when had MI previously.  Denies CP at this time.

## 2015-11-02 NOTE — ED Notes (Signed)
Dr lord notified of troponin  

## 2015-11-02 NOTE — ED Provider Notes (Signed)
[facility] Emergency Department Provider Note   ____________________________________________  Time seen:  I have reviewed the triage vital signs and the triage nursing note.  HISTORY  Chief Complaint Weakness   Historian Patient  HPI Natalie Taylor is a 55 y.o. 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 She states that she was called by the gastroenterology office and told that she needed to have "surgery" and she started to become upset about this and started to breathe rapidly feel lightheaded and then have central chest pressure.  EMS reported she was hyperventilating on the way in.  She states this does not feel similar to when she recently had her heart attack, but her 2 heart attacks felt very different, so she is unsure whether or not that reliable at all.  No recent illnesses such as fever, vomiting, or diarrhea. No coughing.  She feels much better now without dizziness, shortness of breath, or chest discomfort.    Past Medical History  Diagnosis Date  . Hypertension   . Coronary artery disease   . MI (myocardial infarction) (HCC)     x 2  . COPD (chronic obstructive pulmonary disease) (HCC)   . Arthritis   . Hyperlipidemia   . Sleep apnea     Patient Active Problem List   Diagnosis Date Noted  . 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 HLD (hyperlipidemia) 11/01/2015  . Depression 11/01/2015  . ST elevation (STEMI) myocardial infarction involving right coronary artery (HCC) 09/27/2015    Past Surgical History  Procedure Laterality Date  . Cardiac stents    . Tubal ligation    . Cardiac catheterization N/A 09/27/2015    Procedure: Left Heart Cath and Coronary Angiography;  Surgeon: Alwyn Pea, MD;  Location: [facility] INVASIVE CV LAB;  Service: Cardiovascular;  Laterality: N/A;  . Cardiac catheterization N/A 09/27/2015     Procedure: Coronary Stent Intervention;  Surgeon: Alwyn Pea, MD;  Location: [facility] INVASIVE CV LAB;  Service: Cardiovascular;  Laterality: N/A;  . Cholecystectomy      Current Outpatient Rx  Name  Route  Sig  Dispense  Refill  . aspirin 81 MG chewable tablet   Oral   Chew 1 tablet (81 mg total) by mouth daily.   30 tablet   3   . atorvastatin (LIPITOR) 80 MG tablet   Oral   Take 1 tablet (80 mg total) by mouth daily at 6 PM.   30 tablet   3   . ciprofloxacin (CIPRO) 250 MG tablet   Oral   Take 1 tablet (250 mg total) by mouth 2 (two) times daily.   14 tablet   0   . lisinopril (PRINIVIL,ZESTRIL) 10 MG tablet   Oral   Take 1 tablet (10 mg total) by mouth every 12 (twelve) hours. Patient not taking: Reported on 11/02/2015   30 tablet   3   . metoprolol (LOPRESSOR) 50 MG tablet   Oral   Take 0.5 tablets (25 mg total) by mouth 2 (two) times daily.   60 tablet   3   . sertraline (ZOLOFT) 50 MG tablet      1/2 tab a day for 1 week, then increase to 1 tab daily until we see you   30 tablet   1   . ticagrelor (BRILINTA) 90 MG TABS tablet   Oral   Take 1 tablet (90 mg  total) by mouth 2 (two) times daily.   60 tablet   3   . traZODone (DESYREL) 50 MG tablet   Oral   Take 0.5-1 tablets (25-50 mg total) by mouth at bedtime as needed for sleep.   30 tablet   0     Allergies Review of patient's allergies indicates no known allergies.  Family History  Problem Relation Age of Onset  . Alcohol abuse Mother   . Arthritis Mother   . Asthma Mother   . Cancer Mother   . Hypertension Mother   . Migraines Mother   . Hyperlipidemia Sister   . Hyperlipidemia Brother   . Heart disease Maternal Grandmother   . Emphysema Maternal Grandfather     Social History Social History  Substance Use Topics  . Smoking status: Current Every Day Smoker -- 1.00 packs/day    Types: Cigarettes  . Smokeless tobacco: Never Used  . Alcohol Use: No    Review of  Systems  Constitutional: Negative for fever. Eyes: Negative for visual changes. ENT: Negative for sore throat. Cardiovascular: Negative for Palpitations. Respiratory: Negative for cough. Gastrointestinal: Negative for abdominal pain, vomiting and diarrhea. Genitourinary: Negative for dysuria. Musculoskeletal: Negative for back pain. Skin: Negative for rash. Neurological: Negative for headache. 10 point Review of Systems otherwise negative ____________________________________________   PHYSICAL EXAM:  VITAL SIGNS: ED Triage Vitals  Enc Vitals Group     BP 11/02/15 1817 149/80 mmHg     Pulse Rate 11/02/15 1815 53     Resp 11/02/15 1815 17     Temp 11/02/15 1815 97.8 F (36.6 C)     Temp Source 11/02/15 1815 Oral     SpO2 11/02/15 1817 98 %     Weight 11/02/15 1814 150 lb (68.04 kg)     Height 11/02/15 1814  (1.651 m)     Head Cir --      Peak Flow --      Pain Score --      Pain Loc --      Pain Edu? --      Excl. in GC? --      Constitutional: Alert and oriented. Well appearing and in no distress. HEENT   Head: Normocephalic and atraumatic.      Eyes: Conjunctivae are normal. PERRL. Normal extraocular movements.      Ears:         Nose: No congestion/rhinnorhea.   Mouth/Throat: Mucous membranes are moist.   Neck: No stridor. Cardiovascular/Chest: Bradycardic, regular rhythm.  No murmurs, rubs, or gallops. Respiratory: Normal respiratory effort without tachypnea nor retractions. Breath sounds are clear and equal bilaterally. No wheezes/rales/rhonchi. Gastrointestinal: Soft. No distention, no guarding, no rebound. Nontender.    Genitourinary/rectal:Deferred Musculoskeletal: Nontender with normal range of motion in all extremities. No joint effusions.  No lower extremity tenderness.  No edema. Neurologic:  Normal speech and language. No gross or focal neurologic deficits are appreciated. Skin:  Skin is warm, dry and intact. No rash noted. Psychiatric:  Mood and affect are normal. Speech and behavior are normal. Patient exhibits appropriate insight and judgment.  ____________________________________________   EKG I, Governor Rooks, MD, the attending physician have personally viewed and interpreted all ECGs.  59 bpm. Sinus bradycardia. Narrow QRS. Normal axis. Normal ST and T-wave. ____________________________________________  LABS (pertinent positives/negatives)  Labs Reviewed  BASIC METABOLIC PANEL - Abnormal; Notable for the following:    CO2 21 (*)    All other components within normal limits  CBC -  Abnormal; Notable for the following:    WBC 12.2 (*)    All other components within normal limits  TROPONIN I - Abnormal; Notable for the following:    Troponin I 0.04 (*)    All other components within normal limits  TROPONIN I - Abnormal; Notable for the following:    Troponin I 0.03 (*)    All other components within normal limits    ____________________________________________  RADIOLOGY All Xrays were viewed by me. Imaging interpreted by Radiologist.  Chest portable:  No evidence of acute cardio pulmonary disease. __________________________________________  PROCEDURES  Procedure(s) performed: None  Critical Care performed: None  ____________________________________________   ED COURSE / ASSESSMENT AND PLAN  Pertinent labs & imaging results that were available during my care of the patient were reviewed by me and considered in my medical decision making (see chart for details).   It sounds like she clinically had a bit of a panic/anxiety attack after thinking about being told that she had SURGERY. It sounds like she was actually told that she was going to be scheduled for a colonoscopy and she didn't really know what that procedure would entail.  Because of her heart history, she started to get worried about her heart. Her EKG today is reassuring. Her initial troponin is 0.04. She is currently symptomatic, but I  will send a 3 hour recheck level.   Repeat troponin 0.03. Patient will be discharged home, she is to follow up with her cardiologist.    CONSULTATIONS:   None  Patient / Family / Caregiver informed of clinical course, medical decision-making process, and agree with plan.   I discussed return precautions, follow-up instructions, and discharged instructions with patient and/or family.   ___________________________________________   FINAL CLINICAL IMPRESSION(S) / ED DIAGNOSES   Final diagnoses:  Panic attack  Chest pain, unspecified              Note: This dictation was prepared with Dragon dictation. Any transcriptional errors that result from this process are unintentional   Governor Rooksebecca Benford Asch, MD 11/02/15 2308

## 2015-11-02 NOTE — ED Notes (Signed)
Pt c/o "not feeling well", had episode of chest pain, shortness of breath.  Denies CP at this time. Started after was told that she had to have surgery. Pt has hx MI

## 2015-11-03 LAB — GC/CHLAMYDIA PROBE AMP
Chlamydia trachomatis, NAA: NEGATIVE
Neisseria gonorrhoeae by PCR: NEGATIVE

## 2015-11-04 LAB — UA/M W/RFLX CULTURE, ROUTINE
Bilirubin, UA: NEGATIVE
Glucose, UA: NEGATIVE
Ketones, UA: NEGATIVE
Nitrite, UA: POSITIVE — AB
Protein, UA: NEGATIVE
Specific Gravity, UA: <1.005 — ABNORMAL LOW (ref 1.005–1.030)
Urobilinogen, Ur: 0.2 mg/dL (ref 0.2–1.0)
pH, UA: 6 (ref 5.0–7.5)

## 2015-11-04 LAB — MICROALBUMIN, URINE WAIVED
Creatinine, Urine Waived: 50 mg/dL (ref 10–300)
Microalb, Ur Waived: 30 mg/L — ABNORMAL HIGH (ref 0–19)

## 2015-11-04 LAB — MICROSCOPIC EXAMINATION

## 2015-11-04 LAB — URINE CULTURE, REFLEX

## 2015-11-05 ENCOUNTER — Telehealth: Payer: Self-pay | Admitting: Family Medicine

## 2015-11-05 NOTE — Telephone Encounter (Signed)
Please call pt and let her know that her STD screening all came back negative. Thanks

## 2015-11-05 NOTE — Telephone Encounter (Signed)
Patient notified

## 2015-11-14 ENCOUNTER — Other Ambulatory Visit: Payer: Self-pay

## 2015-11-14 MED ORDER — NA SULFATE-K SULFATE-MG SULF 17.5-3.13-1.6 GM/177ML PO SOLN
1.0000 | Freq: Once | ORAL | 0 refills | Status: AC
Start: 2015-11-14 — End: 2015-11-14

## 2015-11-15 ENCOUNTER — Ambulatory Visit (INDEPENDENT_AMBULATORY_CARE_PROVIDER_SITE_OTHER): Payer: Medicaid Other | Admitting: Family Medicine

## 2015-11-15 ENCOUNTER — Encounter: Payer: Self-pay | Admitting: *Deleted

## 2015-11-15 ENCOUNTER — Encounter: Payer: Self-pay | Admitting: Family Medicine

## 2015-11-15 ENCOUNTER — Encounter (INDEPENDENT_AMBULATORY_CARE_PROVIDER_SITE_OTHER): Payer: Self-pay

## 2015-11-15 VITALS — BP 116/79 | HR 68 | Temp 98.3°F | Wt 157.0 lb

## 2015-11-15 DIAGNOSIS — F32A Depression, unspecified: Secondary | ICD-10-CM

## 2015-11-15 DIAGNOSIS — Z23 Encounter for immunization: Secondary | ICD-10-CM

## 2015-11-15 DIAGNOSIS — F329 Major depressive disorder, single episode, unspecified: Secondary | ICD-10-CM

## 2015-11-15 DIAGNOSIS — R197 Diarrhea, unspecified: Secondary | ICD-10-CM

## 2015-11-15 MED ORDER — FLUOXETINE HCL 20 MG PO TABS: 20.0000 mg | ORAL_TABLET | Freq: Every day | ORAL | 3 refills | Status: DC

## 2015-11-15 NOTE — Assessment & Plan Note (Signed)
Better, Didn't take the SSRI- cardiology advised prozac over zoloft, concern about the activating nature, but will try it. Recheck in 3 weeks to see how she's doing.

## 2015-11-15 NOTE — Patient Instructions (Addendum)

## 2015-11-15 NOTE — Progress Notes (Signed)
BP 116/79 (BP Location: Left Arm, Patient Position: Sitting, Cuff Size: Normal)   Pulse 68   Temp 98.3 F (36.8 C)   Wt 157 lb (71.2 kg)   LMP 10/30/2015   SpO2 100%   BMI 26.13 kg/m    Subjective:    Patient ID: Natalie Taylor, female    DOB: [birth year], 55 y.o.   MRN: [id number]  HPI: Natalie Taylor is a 55 y.o. female  Chief Complaint  Patient presents with  . Bradycardia  . Depression   Feeling less tired. Has been following with Dr. Nydia Bouton. Having a lot of diarrhea and GI issues- to have colonoscopy on Monday  DEPRESSION- hasn't taken her zoloft at all was afraid of it. Her cardiologist recommended  Mood status: better Satisfied with current treatment?: no Symptom severity: moderate  Duration of current treatment : Not on anything Side effects: no Medication compliance: poor compliance Psychotherapy/counseling: no  Previous psychiatric medications: None Depressed mood: yes Anxious mood: yes Anhedonia: no Significant weight loss or gain: no Insomnia: yes  Fatigue: yes Feelings of worthlessness or guilt: yes Impaired concentration/indecisiveness: yes Suicidal ideations: yes Hopelessness: no Crying spells: yes Depression screen [facility] 2/9 11/15/2015 11/01/2015  Decreased Interest 2 3  Down, Depressed, Hopeless 1 3  PHQ - 2 Score 3 6  Altered sleeping 1 3  Tired, decreased energy 1 3  Change in appetite 1 3  Feeling bad or failure about yourself  1 3  Trouble concentrating 1 3  Moving slowly or fidgety/restless 0 2  Suicidal thoughts 0 1  PHQ-9 Score 8 24  Difficult doing work/chores - Extremely dIfficult   Relevant past medical, surgical, family and social history reviewed and updated as indicated. Interim medical history since our last visit reviewed. Allergies and medications reviewed and updated.  Review of Systems  Respiratory: Negative.   Cardiovascular: Negative.   Psychiatric/Behavioral: Negative.     Per HPI unless specifically indicated  above     Objective:    BP 116/79 (BP Location: Left Arm, Patient Position: Sitting, Cuff Size: Normal)   Pulse 68   Temp 98.3 F (36.8 C)   Wt 157 lb (71.2 kg)   LMP 10/30/2015   SpO2 100%   BMI 26.13 kg/m   Wt Readings from Last 3 Encounters:  11/15/15 157 lb (71.2 kg)  11/02/15 150 lb (68 kg)  11/01/15 157 lb (71.2 kg)    Physical Exam  Constitutional: She is oriented to person, place, and time. She appears well-developed and well-nourished. No distress.  HENT:  Head: Normocephalic and atraumatic.  Right Ear: Hearing normal.  Left Ear: Hearing normal.  Nose: Nose normal.  Eyes: Conjunctivae and lids are normal. Right eye exhibits no discharge. Left eye exhibits no discharge. No scleral icterus.  Cardiovascular: Normal rate, regular rhythm, normal heart sounds and intact distal pulses.  Exam reveals no gallop and no friction rub.   No murmur heard. Pulmonary/Chest: Effort normal and breath sounds normal. No respiratory distress. She has no wheezes. She has no rales. She exhibits no tenderness.  Musculoskeletal: Normal range of motion.  Neurological: She is alert and oriented to person, place, and time.  Skin: Skin is warm, dry and intact. No rash noted. No erythema. No pallor.  Psychiatric: She has a normal mood and affect. Her speech is normal and behavior is normal. Judgment and thought content normal. Cognition and memory are normal.  Nursing note and vitals reviewed.   Results for orders placed or performed during  the hospital encounter of 11/02/15  Basic metabolic panel  Result Value Ref Range   Sodium 137 135 - 145 mmol/L   Potassium 3.6 3.5 - 5.1 mmol/L   Chloride 106 101 - 111 mmol/L   CO2 21 (L) 22 - 32 mmol/L   Glucose, Bld 97 65 - 99 mg/dL   BUN 10 6 - 20 mg/dL   Creatinine, Ser 8.37 0.44 - 1.00 mg/dL   Calcium 8.9 8.9 - 29.0 mg/dL   GFR calc non Af Amer >60 >60 mL/min   GFR calc Af Amer >60 >60 mL/min   Anion gap 10 5 - 15  CBC  Result Value Ref Range    WBC 12.2 (H) 3.6 - 11.0 K/uL   RBC 4.72 3.80 - 5.20 MIL/uL   Hemoglobin 13.5 12.0 - 16.0 g/dL   HCT 21.1 15.5 - 20.8 %   MCV 86.2 80.0 - 100.0 fL   MCH 28.6 26.0 - 34.0 pg   MCHC 33.2 32.0 - 36.0 g/dL   RDW 02.2 33.6 - 12.2 %   Platelets 239 150 - 440 K/uL  Troponin I  Result Value Ref Range   Troponin I 0.04 (HH) <0.03 ng/mL  Troponin I  Result Value Ref Range   Troponin I 0.03 (HH) <0.03 ng/mL      Assessment & Plan:   Problem List Items Addressed This Visit      Other   Depression - Primary    Better, Didn't take the SSRI- cardiology advised prozac over zoloft, concern about the activating nature, but will try it. Recheck in 3 weeks to see how she's doing.       Relevant Medications   FLUoxetine (PROZAC) 20 MG tablet    Other Visit Diagnoses    Immunization due       updated today.   Diarrhea, unspecified type       Seeing GI for colonoscopy on Monday. BRAT diet given today. Montior closely.       Follow up plan: Return 3 weeks recheck mood.

## 2015-11-16 NOTE — Discharge Instructions (Signed)

## 2015-11-19 ENCOUNTER — Ambulatory Visit
Admission: RE | Admit: 2015-11-19 | Discharge: 2015-11-19 | Disposition: A | Discharge status: Home / Self Care | Payer: Medicaid Other | Source: Ambulatory Visit | Attending: Gastroenterology | Admitting: Gastroenterology

## 2015-11-19 ENCOUNTER — Ambulatory Visit: Payer: Medicaid Other | Admitting: Student in an Organized Health Care Education/Training Program

## 2015-11-19 ENCOUNTER — Encounter
Admission: RE | Disposition: A | Discharge status: Home / Self Care | Payer: Self-pay | Source: Ambulatory Visit | Attending: Gastroenterology

## 2015-11-19 DIAGNOSIS — G4730 Sleep apnea, unspecified: Secondary | ICD-10-CM | POA: Insufficient documentation

## 2015-11-19 DIAGNOSIS — I2510 Atherosclerotic heart disease of native coronary artery without angina pectoris: Secondary | ICD-10-CM | POA: Diagnosis not present

## 2015-11-19 DIAGNOSIS — Z1211 Encounter for screening for malignant neoplasm of colon: Secondary | ICD-10-CM | POA: Diagnosis not present

## 2015-11-19 DIAGNOSIS — Z820 Family history of epilepsy and other diseases of the nervous system: Secondary | ICD-10-CM | POA: Insufficient documentation

## 2015-11-19 DIAGNOSIS — F17210 Nicotine dependence, cigarettes, uncomplicated: Secondary | ICD-10-CM | POA: Diagnosis not present

## 2015-11-19 DIAGNOSIS — Z7982 Long term (current) use of aspirin: Secondary | ICD-10-CM | POA: Insufficient documentation

## 2015-11-19 DIAGNOSIS — I10 Essential (primary) hypertension: Secondary | ICD-10-CM | POA: Insufficient documentation

## 2015-11-19 DIAGNOSIS — R51 Headache: Secondary | ICD-10-CM | POA: Insufficient documentation

## 2015-11-19 DIAGNOSIS — M19042 Primary osteoarthritis, left hand: Secondary | ICD-10-CM | POA: Diagnosis not present

## 2015-11-19 DIAGNOSIS — Z8249 Family history of ischemic heart disease and other diseases of the circulatory system: Secondary | ICD-10-CM | POA: Diagnosis not present

## 2015-11-19 DIAGNOSIS — Z811 Family history of alcohol abuse and dependence: Secondary | ICD-10-CM | POA: Insufficient documentation

## 2015-11-19 DIAGNOSIS — K641 Second degree hemorrhoids: Secondary | ICD-10-CM | POA: Insufficient documentation

## 2015-11-19 DIAGNOSIS — Z809 Family history of malignant neoplasm, unspecified: Secondary | ICD-10-CM | POA: Insufficient documentation

## 2015-11-19 DIAGNOSIS — I252 Old myocardial infarction: Secondary | ICD-10-CM | POA: Diagnosis not present

## 2015-11-19 DIAGNOSIS — M19041 Primary osteoarthritis, right hand: Secondary | ICD-10-CM | POA: Insufficient documentation

## 2015-11-19 DIAGNOSIS — J449 Chronic obstructive pulmonary disease, unspecified: Secondary | ICD-10-CM | POA: Diagnosis not present

## 2015-11-19 DIAGNOSIS — Z9049 Acquired absence of other specified parts of digestive tract: Secondary | ICD-10-CM | POA: Diagnosis not present

## 2015-11-19 DIAGNOSIS — Z79899 Other long term (current) drug therapy: Secondary | ICD-10-CM | POA: Insufficient documentation

## 2015-11-19 DIAGNOSIS — R0602 Shortness of breath: Secondary | ICD-10-CM | POA: Diagnosis not present

## 2015-11-19 DIAGNOSIS — K219 Gastro-esophageal reflux disease without esophagitis: Secondary | ICD-10-CM | POA: Insufficient documentation

## 2015-11-19 DIAGNOSIS — Z825 Family history of asthma and other chronic lower respiratory diseases: Secondary | ICD-10-CM | POA: Diagnosis not present

## 2015-11-19 DIAGNOSIS — Z8261 Family history of arthritis: Secondary | ICD-10-CM | POA: Insufficient documentation

## 2015-11-19 DIAGNOSIS — E785 Hyperlipidemia, unspecified: Secondary | ICD-10-CM | POA: Insufficient documentation

## 2015-11-19 HISTORY — DX: Gastro-esophageal reflux disease without esophagitis: K21.9

## 2015-11-19 HISTORY — DX: Reserved for inherently not codable concepts without codable children: IMO0001

## 2015-11-19 HISTORY — DX: Headache: R51

## 2015-11-19 HISTORY — PX: COLONOSCOPY WITH PROPOFOL: SHX5780

## 2015-11-19 HISTORY — DX: Headache, unspecified: R51.9

## 2015-11-19 SURGERY — COLONOSCOPY WITH PROPOFOL
Anesthesia: Monitor Anesthesia Care

## 2015-11-19 MED ORDER — STERILE WATER FOR IRRIGATION IR SOLN
Status: DC | PRN
  Administered 2015-11-19: 10:00:00

## 2015-11-19 MED ORDER — PROPOFOL 10 MG/ML IV BOLUS
INTRAVENOUS | Status: DC | PRN
  Administered 2015-11-19: 40 mg via INTRAVENOUS
  Administered 2015-11-19: 50 mg via INTRAVENOUS
  Administered 2015-11-19: 100 mg via INTRAVENOUS
  Administered 2015-11-19: 50 mg via INTRAVENOUS
  Administered 2015-11-19: 30 mg via INTRAVENOUS
  Administered 2015-11-19: 20 mg via INTRAVENOUS

## 2015-11-19 MED ORDER — LIDOCAINE HCL (CARDIAC) 20 MG/ML IV SOLN
INTRAVENOUS | Status: DC | PRN
  Administered 2015-11-19: 50 mg via INTRAVENOUS

## 2015-11-19 MED ORDER — OXYCODONE HCL 5 MG/5ML PO SOLN: 5.0000 mg | Freq: Once | ORAL | Status: DC | PRN

## 2015-11-19 MED ORDER — LACTATED RINGERS IV SOLN
INTRAVENOUS | Status: DC
  Administered 2015-11-19: 09:00:00 via INTRAVENOUS

## 2015-11-19 MED ORDER — OXYCODONE HCL 5 MG PO TABS: 5.0000 mg | ORAL_TABLET | Freq: Once | ORAL | Status: DC | PRN

## 2015-11-19 SURGICAL SUPPLY — 23 items
CANISTER SUCT 1200ML W/VALVE (MISCELLANEOUS) ×2 IMPLANT
CLIP HMST 235XBRD CATH ROT (MISCELLANEOUS) IMPLANT
CLIP RESOLUTION 360 11X235 (MISCELLANEOUS)
FCP ESCP3.2XJMB 240X2.8X (MISCELLANEOUS)
FORCEPS BIOP RAD 4 LRG CAP 4 (CUTTING FORCEPS) IMPLANT
FORCEPS BIOP RJ4 240 W/NDL (MISCELLANEOUS)
FORCEPS ESCP3.2XJMB 240X2.8X (MISCELLANEOUS) IMPLANT
GOWN CVR UNV OPN BCK APRN NK (MISCELLANEOUS) ×2 IMPLANT
GOWN ISOL THUMB LOOP REG UNIV (MISCELLANEOUS) ×2
INJECTOR VARIJECT VIN23 (MISCELLANEOUS) IMPLANT
KIT DEFENDO VALVE AND CONN (KITS) IMPLANT
KIT ENDO PROCEDURE OLY (KITS) ×2 IMPLANT
MARKER SPOT ENDO TATTOO 5ML (MISCELLANEOUS) IMPLANT
PAD GROUND ADULT SPLIT (MISCELLANEOUS) IMPLANT
PROBE APC STR FIRE (PROBE) IMPLANT
RETRIEVER NET ROTH 2.5X230 LF (MISCELLANEOUS) ×2 IMPLANT
SNARE SHORT THROW 13M SML OVAL (MISCELLANEOUS) IMPLANT
SNARE SHORT THROW 30M LRG OVAL (MISCELLANEOUS) IMPLANT
SNARE SNG USE RND 15MM (INSTRUMENTS) IMPLANT
SPOT EX ENDOSCOPIC TATTOO (MISCELLANEOUS)
TRAP ETRAP POLY (MISCELLANEOUS) IMPLANT
VARIJECT INJECTOR VIN23 (MISCELLANEOUS)
WATER STERILE IRR 250ML POUR (IV SOLUTION) ×2 IMPLANT

## 2015-11-19 NOTE — Anesthesia Preprocedure Evaluation (Addendum)
Anesthesia Evaluation  Patient identified by MRN, date of birth, ID band  Reviewed: NPO status   History of Anesthesia Complications Negative for: history of anesthetic complications  Airway Mallampati: II  TM Distance: >3 FB Neck ROM: full    Dental  (+) Poor Dentition, Chipped, Missing,    Pulmonary neg pulmonary ROS, COPD (mild), Current Smoker,    Pulmonary exam normal        Cardiovascular Exercise Tolerance: Good hypertension, + CAD, + Past MI (MI: 2009, 09/2015) and + Cardiac Stents (DEStent 09/2015)  Normal cardiovascular exam  ekg: nsr;  cath: 09/2015:  Prox RCA lesion, 100% stenosed. The lesion was not previously treated.  Mid RCA lesion, 100% stenosed. The lesion was previously treated with a stent (unknown type) greater than two years ago.  Mid LAD lesion, 25% stenosed. The lesion was not previously treated.  successful PCI and stent to the proximal to mid totally occluded RCA with in-stent thrombosis with a DES stent;    stress: 10/2015: ef=45-50%; Regional wall motion:reveals normal myocardial thickening and wall  motion. The overall quality of the study is good. Artifacts noted: no Left ventricular cavity: normal.;     Neuro/Psych  Headaches, negative psych ROS   GI/Hepatic Neg liver ROS, GERD  Controlled,  Endo/Other  negative endocrine ROS  Renal/GU negative Renal ROS  negative genitourinary   Musculoskeletal  (+) Arthritis ,   Abdominal   Peds  Hematology negative hematology ROS (+)   Anesthesia Other Findings Last aspirin: 7/31 Last metoprolol: 7/31; Last brilinta: 7/30 (surgeon requests pt take pill after procedure);   Cards cleared to proceed: 11/16/2015: Dr. Juliann Pares.  Spoke with Dr. Juliann Pares : 7/31: ok to proceed.  Reproductive/Obstetrics                         Anesthesia Physical Anesthesia Plan  ASA: III  Anesthesia Plan: MAC   Post-op Pain  Management:    Induction:   Airway Management Planned:   Additional Equipment:   Intra-op Plan:   Post-operative Plan:   Informed Consent: I have reviewed the patients History and Physical, chart, labs and discussed the procedure including the risks, benefits and alternatives for the proposed anesthesia with the patient or authorized representative who has indicated his/her understanding and acceptance.     Plan Discussed with: CRNA  Anesthesia Plan Comments:        Anesthesia Quick Evaluation

## 2015-11-19 NOTE — Op Note (Signed)
[facility] Patient Name: Natalie Taylor Procedure Date: 11/19/2015 10:03 AM MRN: [id number] Account #: [id number] Date of Birth: December 25, 1960 Admit Type: Outpatient Age: 55 Room: [facility] OR ROOM 01 Gender: Female Note Status: Finalized Procedure:            Colonoscopy Indications:          Screening for colorectal malignant neoplasm Providers:            Midge Minium MD, MD Referring MD:         Dorcas Carrow (Referring MD) Medicines:            Propofol per Anesthesia Complications:        No immediate complications. Procedure:            Pre-Anesthesia Assessment:                       - Prior to the procedure, a History and Physical was                        performed, and patient medications and allergies were                        reviewed. The patient's tolerance of previous                        anesthesia was also reviewed. The risks and benefits of                        the procedure and the sedation options and risks were                        discussed with the patient. All questions were                        answered, and informed consent was obtained. Prior                        Anticoagulants: The patient has taken anticoagulant                        medication. ASA Grade Assessment: II - A patient with                        mild systemic disease. After reviewing the risks and                        benefits, the patient was deemed in satisfactory                        condition to undergo the procedure.                       After obtaining informed consent, the colonoscope was                        passed under direct vision. Throughout the procedure,                        the patient's blood pressure, pulse, and oxygen  saturations were monitored continuously. The Olympus CF                        H180AL colonoscope (S#: [id number]) was introduced through                        the anus and advanced to  the the cecum, identified by                        appendiceal orifice and ileocecal valve. The                        colonoscopy was performed without difficulty. The                        patient tolerated the procedure well. The quality of                        the bowel preparation was fair. Findings:      The perianal and digital rectal examinations were normal.      Non-bleeding internal hemorrhoids were found during retroflexion. The       hemorrhoids were Grade II (internal hemorrhoids that prolapse but reduce       spontaneously). Impression:           - Preparation of the colon was fair.                       - Non-bleeding internal hemorrhoids.                       - No specimens collected. Recommendation:       - Repeat colonoscopy in 10 years for screening unless                        any change in family history or lower GI problems. Procedure Code(s):    --- Professional ---                       [phone], Colonoscopy, flexible; diagnostic, including                        collection of specimen(s) by brushing or washing, when                        performed (separate procedure) Diagnosis Code(s):    --- Professional ---                       Z12.11, Encounter for screening for malignant neoplasm                        of colon CPT copyright 2016 American [employer] Association. All rights reserved. The codes documented in this report are preliminary and upon coder review may  be revised to meet current compliance requirements. Midge Minium MD, MD 11/19/2015 10:22:57 AM This report has been signed electronically. Number of Addenda: 0 Note Initiated On: 11/19/2015 10:03 AM Scope Withdrawal Time: 0 hours 5 minutes 16 seconds  Total Procedure Duration: 0 hours 12 minutes 5 seconds       [facility]

## 2015-11-19 NOTE — Anesthesia Postprocedure Evaluation (Signed)
Anesthesia Post Note  Patient: Natalie Taylor  Procedure(s) Performed: Procedure(s) (LRB): COLONOSCOPY WITH PROPOFOL (N/A)  Patient location during evaluation: PACU Anesthesia Type: MAC Level of consciousness: awake and alert Pain management: pain level controlled Vital Signs Assessment: post-procedure vital signs reviewed and stable Respiratory status: spontaneous breathing, nonlabored ventilation, respiratory function stable and patient connected to nasal cannula oxygen Cardiovascular status: stable and blood pressure returned to baseline Anesthetic complications: no    Tameka Hoiland

## 2015-11-19 NOTE — Anesthesia Procedure Notes (Signed)
Procedure Name: MAC Date/Time: 11/19/2015 10:04 AM Performed by: Maree Krabbe Pre-anesthesia Checklist: Patient identified, Emergency Drugs available, Suction available, Timeout performed and Patient being monitored Patient Re-evaluated:Patient Re-evaluated prior to inductionOxygen Delivery Method: Nasal cannula Placement Confirmation: positive ETCO2

## 2015-11-19 NOTE — Transfer of Care (Signed)
Immediate Anesthesia Transfer of Care Note  Patient: Natalie Taylor  Procedure(s) Performed: Procedure(s): COLONOSCOPY WITH PROPOFOL (N/A)  Patient Location: PACU  Anesthesia Type: MAC  Level of Consciousness: awake, alert  and patient cooperative  Airway and Oxygen Therapy: Patient Spontanous Breathing and Patient connected to supplemental oxygen  Post-op Assessment: Post-op Vital signs reviewed, Patient's Cardiovascular Status Stable, Respiratory Function Stable, Patent Airway and No signs of Nausea or vomiting  Post-op Vital Signs: Reviewed and stable  Complications: No apparent anesthesia complications

## 2015-11-19 NOTE — H&P (Signed)
Midge Minium, MD [facility] [address], [address] [place], [place] [address] Phone: [phone] Fax : [phone]  Primary Care Physician:  Olevia Perches, DO Primary Gastroenterologist:  Dr. Servando Snare  Pre-Procedure History & Physical: HPI:  Natalie Taylor is a 55 y.o. female is here for a screening colonoscopy.   Past Medical History:  Diagnosis Date  . Arthritis    hands  . COPD (chronic obstructive pulmonary disease) (HCC)   . Coronary artery disease   . GERD (gastroesophageal reflux disease)   . Headache    daily  . Hyperlipidemia   . Hypertension   . MI (myocardial infarction) (HCC)    x 2  . Shortness of breath dyspnea    pt attributes to meds  . Sleep apnea     Past Surgical History:  Procedure Laterality Date  . CARDIAC CATHETERIZATION N/A 09/27/2015   Procedure: Left Heart Cath and Coronary Angiography;  Surgeon: Alwyn Pea, MD;  Location: [facility] INVASIVE CV LAB;  Service: Cardiovascular;  Laterality: N/A;  . CARDIAC CATHETERIZATION N/A 09/27/2015   Procedure: Coronary Stent Intervention;  Surgeon: Alwyn Pea, MD;  Location: [facility] INVASIVE CV LAB;  Service: Cardiovascular;  Laterality: N/A;  . Cardiac stents  09/27/2015  . CHOLECYSTECTOMY    . INDUCED ABORTION    . MULTIPLE TOOTH EXTRACTIONS    . TUBAL LIGATION      Prior to Admission medications   Medication Sig Start Date End Date Taking? Authorizing Provider  aspirin 81 MG chewable tablet Chew 1 tablet (81 mg total) by mouth daily. 09/29/15  Yes Enedina Finner, MD  atorvastatin (LIPITOR) 80 MG tablet Take 1 tablet (80 mg total) by mouth daily at 6 PM. 09/29/15  Yes Enedina Finner, MD  FLUoxetine (PROZAC) 20 MG tablet Take 1 tablet (20 mg total) by mouth daily. 11/15/15  Yes Megan P Johnson, DO  lisinopril (PRINIVIL,ZESTRIL) 10 MG tablet Take 1 tablet (10 mg total) by mouth every 12 (twelve) hours. 09/29/15  Yes Enedina Finner, MD  metoprolol (LOPRESSOR) 50 MG tablet Take 0.5 tablets (25 mg total) by mouth 2 (two) times  daily. 11/01/15  Yes Megan P Johnson, DO  ticagrelor (BRILINTA) 90 MG TABS tablet Take 1 tablet (90 mg total) by mouth 2 (two) times daily. 09/29/15  Yes Enedina Finner, MD    Allergies as of 11/02/2015  . (No Known Allergies)    Family History  Problem Relation Age of Onset  . Alcohol abuse Mother   . Arthritis Mother   . Asthma Mother   . Cancer Mother   . Hypertension Mother   . Migraines Mother   . Hyperlipidemia Sister   . Hyperlipidemia Brother   . Heart disease Maternal Grandmother   . Emphysema Maternal Grandfather     Social History   Social History  . Marital status: Divorced    Spouse name: N/A  . Number of children: N/A  . Years of education: N/A   Occupational History  . Not on file.   Social History Main Topics  . Smoking status: Current Every Day Smoker    Packs/day: 0.50    Years: 40.00    Types: Cigarettes  . Smokeless tobacco: Never Used  . Alcohol use No  . Drug use: No  . Sexual activity: Yes   Other Topics Concern  . Not on file   Social History Narrative  . No narrative on file    Review of Systems: See HPI, otherwise negative ROS  Physical Exam: BP 126/78  Pulse 71   Temp 97.7 F (36.5 C) (Temporal)   Resp 16   Ht  (1.651 m)   Wt 159 lb (72.1 kg)   LMP 10/30/2015 Comment: Preg test negative  SpO2 99%   BMI 26.46 kg/m  General:   Alert,  pleasant and cooperative in NAD Head:  Normocephalic and atraumatic. Neck:  Supple; no masses or thyromegaly. Lungs:  Clear throughout to auscultation.    Heart:  Regular rate and rhythm. Abdomen:  Soft, nontender and nondistended. Normal bowel sounds, without guarding, and without rebound.   Neurologic:  Alert and  oriented x4;  grossly normal neurologically.  Impression/Plan: Natalie Taylor is now here to undergo a screening colonoscopy.  Risks, benefits, and alternatives regarding colonoscopy have been reviewed with the patient.  Questions have been answered.  All parties  agreeable.

## 2015-11-27 ENCOUNTER — Encounter: Payer: Medicaid Other | Admitting: Family Medicine

## 2015-12-13 ENCOUNTER — Encounter: Payer: Medicaid Other | Admitting: Family Medicine

## 2016-03-21 ENCOUNTER — Encounter: Payer: Self-pay | Admitting: Emergency Medicine

## 2016-03-21 ENCOUNTER — Emergency Department
Admission: EM | Admit: 2016-03-21 | Discharge: 2016-03-21 | Disposition: A | Discharge status: Home / Self Care | Payer: Medicaid Other | Attending: Emergency Medicine | Admitting: Emergency Medicine

## 2016-03-21 ENCOUNTER — Emergency Department: Payer: Medicaid Other

## 2016-03-21 DIAGNOSIS — Y9389 Activity, other specified: Secondary | ICD-10-CM | POA: Diagnosis not present

## 2016-03-21 DIAGNOSIS — Y929 Unspecified place or not applicable: Secondary | ICD-10-CM | POA: Diagnosis not present

## 2016-03-21 DIAGNOSIS — Z7982 Long term (current) use of aspirin: Secondary | ICD-10-CM | POA: Insufficient documentation

## 2016-03-21 DIAGNOSIS — I2510 Atherosclerotic heart disease of native coronary artery without angina pectoris: Secondary | ICD-10-CM | POA: Diagnosis not present

## 2016-03-21 DIAGNOSIS — W208XXA Other cause of strike by thrown, projected or falling object, initial encounter: Secondary | ICD-10-CM | POA: Diagnosis not present

## 2016-03-21 DIAGNOSIS — Y999 Unspecified external cause status: Secondary | ICD-10-CM | POA: Insufficient documentation

## 2016-03-21 DIAGNOSIS — S9031XA Contusion of right foot, initial encounter: Secondary | ICD-10-CM | POA: Diagnosis not present

## 2016-03-21 DIAGNOSIS — Z79899 Other long term (current) drug therapy: Secondary | ICD-10-CM | POA: Insufficient documentation

## 2016-03-21 DIAGNOSIS — J449 Chronic obstructive pulmonary disease, unspecified: Secondary | ICD-10-CM | POA: Diagnosis not present

## 2016-03-21 DIAGNOSIS — I10 Essential (primary) hypertension: Secondary | ICD-10-CM | POA: Insufficient documentation

## 2016-03-21 DIAGNOSIS — F17210 Nicotine dependence, cigarettes, uncomplicated: Secondary | ICD-10-CM | POA: Insufficient documentation

## 2016-03-21 DIAGNOSIS — S99921A Unspecified injury of right foot, initial encounter: Secondary | ICD-10-CM | POA: Diagnosis present

## 2016-03-21 MED ORDER — HYDROCODONE-ACETAMINOPHEN 5-325 MG PO TABS: 1.0000 | ORAL_TABLET | Freq: Three times a day (TID) | ORAL | 0 refills | Status: DC | PRN

## 2016-03-21 NOTE — Discharge Instructions (Signed)
Take the prescription pain medicine in addition to your ibuprofen for pain relief. Apply ice to reduce pain and swelling. Follow-up with your provider or Dr. Rosita KeaMenz for continued symptoms. Wear the post-op shoe for support while walking.

## 2016-03-21 NOTE — ED Triage Notes (Signed)
Pt reports dropping a can on her right foot two days ago. Pt states the pain in her right foot has gotten progressively worse. Pt limping in triage.

## 2016-03-21 NOTE — ED Provider Notes (Signed)
[facility] Emergency Department Provider Note ____________________________________________  Time seen: [id number]  I have reviewed the triage vital signs and the nursing notes.  HISTORY  Chief Complaint  Foot Pain  HPI Natalie Taylor is a 55 y.o. female visits to the ED By her husband, for evaluation of right foot pain from 4 days prior. Patient describes dropping a can of vesicles on her right foot from a shelf. She was barefoot at the time. Since that time she's had increasing pain to the dorsal aspect of the foot at the toes. She didn't denies any other injury at this time. Her interventions have been ibuprofen and elevation. She presents now with pain related to weightbearing.  Past Medical History:  Diagnosis Date  . Arthritis    hands  . COPD (chronic obstructive pulmonary disease) (HCC)   . Coronary artery disease   . GERD (gastroesophageal reflux disease)   . Headache    daily  . Hyperlipidemia   . Hypertension   . MI (myocardial infarction)    x 2  . Shortness of breath dyspnea    pt attributes to meds  . Sleep apnea     Patient Active Problem List   Diagnosis Date Noted  . Special screening for malignant neoplasms, colon   . 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 HLD (hyperlipidemia) 11/01/2015  . Depression 11/01/2015  . ST elevation (STEMI) myocardial infarction involving right coronary artery (HCC) 09/27/2015    Past Surgical History:  Procedure Laterality Date  . CARDIAC CATHETERIZATION N/A 09/27/2015   Procedure: Left Heart Cath and Coronary Angiography;  Surgeon: Alwyn Peawayne D Callwood, MD;  Location: [facility] INVASIVE CV LAB;  Service: Cardiovascular;  Laterality: N/A;  . CARDIAC CATHETERIZATION N/A 09/27/2015   Procedure: Coronary Stent Intervention;  Surgeon: Alwyn Peawayne D Callwood, MD;  Location: [facility] INVASIVE CV LAB;  Service: Cardiovascular;  Laterality: N/A;  . Cardiac stents  09/27/2015  . CHOLECYSTECTOMY    . COLONOSCOPY WITH  PROPOFOL N/A 11/19/2015   Procedure: COLONOSCOPY WITH PROPOFOL;  Surgeon: Midge Miniumarren Wohl, MD;  [facility];  Service: Endoscopy;  Laterality: N/A;  . INDUCED ABORTION    . MULTIPLE TOOTH EXTRACTIONS    . TUBAL LIGATION      Prior to Admission medications   Medication Sig Start Date End Date Taking? Authorizing Provider  aspirin 81 MG chewable tablet Chew 1 tablet (81 mg total) by mouth daily. 09/29/15   Enedina FinnerSona Patel, MD  atorvastatin (LIPITOR) 80 MG tablet Take 1 tablet (80 mg total) by mouth daily at 6 PM. 09/29/15   Enedina FinnerSona Patel, MD  FLUoxetine (PROZAC) 20 MG tablet Take 1 tablet (20 mg total) by mouth daily. 11/15/15   Megan P Johnson, DO  HYDROcodone-acetaminophen (NORCO) 5-325 MG tablet Take 1 tablet by mouth 3 (three) times daily as needed. 03/21/16   Preeti Winegardner V Bacon Amayah Staheli, PA-C  lisinopril (PRINIVIL,ZESTRIL) 10 MG tablet Take 1 tablet (10 mg total) by mouth every 12 (twelve) hours. 09/29/15   Enedina FinnerSona Patel, MD  metoprolol (LOPRESSOR) 50 MG tablet Take 0.5 tablets (25 mg total) by mouth 2 (two) times daily. 11/01/15   Megan P Johnson, DO  ticagrelor (BRILINTA) 90 MG TABS tablet Take 1 tablet (90 mg total) by mouth 2 (two) times daily. 09/29/15   Enedina FinnerSona Patel, MD    Allergies Cranberry and Tape  Family History  Problem Relation Age of Onset  . Alcohol abuse Mother   . Arthritis Mother   . Asthma Mother   .  Cancer Mother   . Hypertension Mother   . Migraines Mother   . Hyperlipidemia Sister   . Hyperlipidemia Brother   . Heart disease Maternal Grandmother   . Emphysema Maternal Grandfather     Social History Social History  Substance Use Topics  . Smoking status: Current Every Day Smoker    Packs/day: 0.50    Years: 40.00    Types: Cigarettes  . Smokeless tobacco: Never Used  . Alcohol use No    Review of Systems  Constitutional: Negative for fever. Musculoskeletal: Negative for back pain. The pain as above. Skin: Negative for rash. Neurological: Negative for  headaches, focal weakness or numbness. ____________________________________________  PHYSICAL EXAM:  VITAL SIGNS: ED Triage Vitals [03/21/16 0929]  Enc Vitals Group     BP (!) 179/92     Pulse Rate (!) 50     Resp 18     Temp 97.6 F (36.4 C)     Temp Source Oral     SpO2 100 %     Weight 140 lb (63.5 kg)     Height 5\' 5"  (1.651 m)     Head Circumference      Peak Flow      Pain Score 8     Pain Loc      Pain Edu?      Excl. in GC?     Constitutional: Alert and oriented. Well appearing and in no distress. Head: Normocephalic and atraumatic. Cardiovascular: . Normal distal pulses. Respiratory: Normal respiratory effort. Musculoskeletal: Right foot without any obvious deformity, dislocation, or edema. Patient with some subtle bruising noted to the dorsal aspect of the MCPs from the third to fifth digits. Ankle exam is benign. Patient with normal toe flexion range. Nontender with normal range of motion in all extremities.  Neurologic:  Mildly antalgic gait without ataxia. Normal speech and language. No gross focal neurologic deficits are appreciated. Skin:  Skin is warm, dry and intact. No rash noted. Mild bruising as noted above without laceration, or abrasion. ____________________________________________   RADIOLOGY  Right Foot Negative  I, Oswin Johal, Charlesetta IvoryJenise V Bacon, personally viewed and evaluated these images (plain radiographs) as part of my medical decision making, as well as reviewing the written report by the radiologist. ____________________________________________  PROCEDURES  Post-Op shoe ____________________________________________  INITIAL IMPRESSION / ASSESSMENT AND PLAN / ED COURSE  Patient with a right foot contusion without radiologic evidence of fracture dislocation. She is fitted with a postop shoe for comfort. Provided with prescriptions for Norco (#12) to dose in addition to her ibuprofen. She will rest, ice, elevate the foot as needed and he last  postop shoe to ambulate. She will follow-up with her primary care provider or orthopedics ongoing symptom management.  Clinical Course    ____________________________________________  FINAL CLINICAL IMPRESSION(S) / ED DIAGNOSES  Final diagnoses:  Contusion of right foot, initial encounter      Lissa HoardJenise V Bacon Lovis More, PA-C 03/21/16 1046    Myrna Blazeravid Matthew Schaevitz, MD 03/21/16 [phone]

## 2016-03-22 ENCOUNTER — Emergency Department: Payer: Medicaid Other

## 2016-03-22 ENCOUNTER — Emergency Department
Admission: EM | Admit: 2016-03-22 | Discharge: 2016-03-22 | Disposition: A | Discharge status: Home / Self Care | Payer: Medicaid Other | Attending: Emergency Medicine | Admitting: Emergency Medicine

## 2016-03-22 DIAGNOSIS — J449 Chronic obstructive pulmonary disease, unspecified: Secondary | ICD-10-CM | POA: Insufficient documentation

## 2016-03-22 DIAGNOSIS — Z79899 Other long term (current) drug therapy: Secondary | ICD-10-CM | POA: Insufficient documentation

## 2016-03-22 DIAGNOSIS — R079 Chest pain, unspecified: Secondary | ICD-10-CM | POA: Diagnosis present

## 2016-03-22 DIAGNOSIS — I2510 Atherosclerotic heart disease of native coronary artery without angina pectoris: Secondary | ICD-10-CM | POA: Insufficient documentation

## 2016-03-22 DIAGNOSIS — I10 Essential (primary) hypertension: Secondary | ICD-10-CM | POA: Insufficient documentation

## 2016-03-22 DIAGNOSIS — F17210 Nicotine dependence, cigarettes, uncomplicated: Secondary | ICD-10-CM | POA: Diagnosis not present

## 2016-03-22 LAB — CBC
HCT: 43.8 % (ref 35.0–47.0)
Hemoglobin: 15 g/dL (ref 12.0–16.0)
MCH: 30.1 pg (ref 26.0–34.0)
MCHC: 34.3 g/dL (ref 32.0–36.0)
MCV: 87.7 fL (ref 80.0–100.0)
Platelets: 204 10*3/uL (ref 150–440)
RBC: 4.99 MIL/uL (ref 3.80–5.20)
RDW: 13.4 % (ref 11.5–14.5)
WBC: 10.2 10*3/uL (ref 3.6–11.0)

## 2016-03-22 LAB — TROPONIN I
Troponin I: 0.03 ng/mL (ref <0.03)
Troponin I: <0.03 ng/mL (ref <0.03)

## 2016-03-22 LAB — BASIC METABOLIC PANEL
Anion gap: 7 (ref 5–15)
BUN: 16 mg/dL (ref 6–20)
CO2: 24 mmol/L (ref 22–32)
Calcium: 9.2 mg/dL (ref 8.9–10.3)
Chloride: 106 mmol/L (ref 101–111)
Creatinine, Ser: 0.92 mg/dL (ref 0.44–1.00)
GFR calc Af Amer: >60 mL/min (ref >60)
GFR calc non Af Amer: >60 mL/min (ref >60)
Glucose, Bld: 99 mg/dL (ref 65–99)
Potassium: 3.6 mmol/L (ref 3.5–5.1)
Sodium: 137 mmol/L (ref 135–145)

## 2016-03-22 NOTE — ED Notes (Signed)
Pt alert and oriented X4, active, cooperative, pt in NAD. RR even and unlabored, color WNL.  Pt informed to return if any life threatening symptoms occur.   

## 2016-03-22 NOTE — ED Notes (Signed)
ED Provider at bedside. 

## 2016-03-22 NOTE — ED Triage Notes (Signed)
Pt arrives to ER via [facility] from home after awakening with diaphoresis, anxiety and vomit X 1. Pt hx of anxiety and panic attacks, does not take her prescribed medications for anxiety. Pt limping while walking, bruise to top of right foot seen yesterday for foot.

## 2016-03-22 NOTE — ED Notes (Signed)
Pt calm, cooperative and laughing with staff at this time. Describes chest pain as a "gas" feeling to chest.

## 2016-03-22 NOTE — ED Provider Notes (Signed)
[facility] Emergency Department Provider Note   ____________________________________________    I have reviewed the triage vital signs and the nursing notes.   HISTORY  Chief Complaint Anxiety and Chest Pain     HPI Natalie Taylor is a 55 y.o. female who presents with complaints of diaphoresis, anxiety and chest pain. Patient reports she started sweating this morning across her forehead and hands and then had aching pain in her left anterior chest and felt SOB. She reports these are similar symptoms to when her "stent closed" in the past. She follows with Dr. Juliann Paresallwood of cardiology. Currently she feels well and has no complaints and feels "embarrassed for calling EMS ".   Past Medical History:  Diagnosis Date  . Arthritis    hands  . COPD (chronic obstructive pulmonary disease) (HCC)   . Coronary artery disease   . GERD (gastroesophageal reflux disease)   . Headache    daily  . Hyperlipidemia   . Hypertension   . MI (myocardial infarction)    x 2  . Shortness of breath dyspnea    pt attributes to meds  . Sleep apnea     Patient Active Problem List   Diagnosis Date Noted  . Special screening for malignant neoplasms, colon   . 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 HLD (hyperlipidemia) 11/01/2015  . Depression 11/01/2015  . ST elevation (STEMI) myocardial infarction involving right coronary artery (HCC) 09/27/2015    Past Surgical History:  Procedure Laterality Date  . CARDIAC CATHETERIZATION N/A 09/27/2015   Procedure: Left Heart Cath and Coronary Angiography;  Surgeon: Alwyn Peawayne D Callwood, MD;  Location: [facility] INVASIVE CV LAB;  Service: Cardiovascular;  Laterality: N/A;  . CARDIAC CATHETERIZATION N/A 09/27/2015   Procedure: Coronary Stent Intervention;  Surgeon: Alwyn Peawayne D Callwood, MD;  Location: [facility] INVASIVE CV LAB;  Service: Cardiovascular;  Laterality: N/A;  . Cardiac stents  09/27/2015  . CHOLECYSTECTOMY    . COLONOSCOPY  WITH PROPOFOL N/A 11/19/2015   Procedure: COLONOSCOPY WITH PROPOFOL;  Surgeon: Midge Miniumarren Wohl, MD;  [facility];  Service: Endoscopy;  Laterality: N/A;  . INDUCED ABORTION    . MULTIPLE TOOTH EXTRACTIONS    . TUBAL LIGATION      Prior to Admission medications   Medication Sig Start Date End Date Taking? Authorizing Provider  aspirin 81 MG chewable tablet Chew 1 tablet (81 mg total) by mouth daily. 09/29/15  Yes Enedina FinnerSona Patel, MD  atorvastatin (LIPITOR) 80 MG tablet Take 1 tablet (80 mg total) by mouth daily at 6 PM. 09/29/15  Yes Enedina FinnerSona Patel, MD  lisinopril (PRINIVIL,ZESTRIL) 10 MG tablet Take 1 tablet (10 mg total) by mouth every 12 (twelve) hours. Patient taking differently: Take 5 mg by mouth every 12 (twelve) hours.  09/29/15  Yes Enedina FinnerSona Patel, MD  metoprolol (LOPRESSOR) 50 MG tablet Take 0.5 tablets (25 mg total) by mouth 2 (two) times daily. 11/01/15  Yes Megan P Johnson, DO  ticagrelor (BRILINTA) 90 MG TABS tablet Take 1 tablet (90 mg total) by mouth 2 (two) times daily. 09/29/15  Yes Enedina FinnerSona Patel, MD     Allergies Cranberry and Tape  Family History  Problem Relation Age of Onset  . Alcohol abuse Mother   . Arthritis Mother   . Asthma Mother   . Cancer Mother   . Hypertension Mother   . Migraines Mother   . Hyperlipidemia Sister   . Hyperlipidemia Brother   . Heart disease Maternal Grandmother   .  Emphysema Maternal Grandfather     Social History Social History  Substance Use Topics  . Smoking status: Current Every Day Smoker    Packs/day: 0.50    Years: 40.00    Types: Cigarettes  . Smokeless tobacco: Never Used  . Alcohol use No    Review of Systems  Constitutional: No fever/chills  Cardiovascular: As above Respiratory: As above Gastrointestinal: No abdominal pain. Positive nausea, now resolved   Genitourinary: Negative for dysuria. Musculoskeletal: Negative for back pain. Skin: Negative for rash. Neurological: Negative for headaches  10-point ROS  otherwise negative.  ____________________________________________   PHYSICAL EXAM:  VITAL SIGNS: ED Triage Vitals  Enc Vitals Group     BP 03/22/16 0841 (!) 143/89     Pulse Rate 03/22/16 0841 (!) 55     Resp 03/22/16 0841 18     Temp 03/22/16 0841 97.7 F (36.5 C)     Temp Source 03/22/16 0841 Oral     SpO2 03/22/16 0841 98 %     Weight 03/22/16 0843 140 lb (63.5 kg)     Height 03/22/16 0843 5\' 5"  (1.651 m)     Head Circumference --      Peak Flow --      Pain Score 03/22/16 0843 3     Pain Loc --      Pain Edu? --      Excl. in GC? --     Constitutional: Alert and oriented. No acute distress. Pleasant and interactive Eyes: Conjunctivae are normal.  Head: Atraumatic. Nose: No congestion/rhinnorhea. Mouth/Throat: Mucous membranes are moist.    Cardiovascular: Bradycardia, regular rhythm. Grossly normal heart sounds.  Good peripheral circulation. Respiratory: Normal respiratory effort.  No retractions. Lungs CTAB. Gastrointestinal: Soft and nontender. No distention.  No CVA tenderness. Genitourinary: deferred Musculoskeletal: No lower extremity tenderness nor edema.  Warm and well perfused Neurologic:  Normal speech and language. No gross focal neurologic deficits are appreciated.  Skin:  Skin is warm, dry and intact. No rash noted. Psychiatric: Mood and affect are normal. Speech and behavior are normal.  ____________________________________________   LABS (all labs ordered are listed, but only abnormal results are displayed)  Labs Reviewed  TROPONIN I - Abnormal; Notable for the following:       Result Value   Troponin I 0.03 (*)    All other components within normal limits  BASIC METABOLIC PANEL  CBC  TROPONIN I   ____________________________________________  EKG  ED ECG REPORT I, Jene Every, the attending physician, personally viewed and interpreted this ECG.  Date: 03/22/2016 EKG Time: 8:41 AM Rate: 52 Rhythm: normal sinus rhythm QRS Axis:  normal Intervals: normal ST/T Wave abnormalities: normal Conduction Disturbances: none   ____________________________________________  RADIOLOGY  Chest x-ray unremarkable ____________________________________________   PROCEDURES  Procedure(s) performed: No    Critical Care performed: No ____________________________________________   INITIAL IMPRESSION / ASSESSMENT AND PLAN / ED COURSE  Pertinent labs & imaging results that were available during my care of the patient were reviewed by me and considered in my medical decision making (see chart for details).  Patient well-appearing in the emergency department and chest pain has resolved, given her history of coronary artery disease with, we will check labs, EKG, chest x-ray and reevaluate.  Clinical Course   Initial troponin 0.03 the patient is chest pain-free and well-appearing. Her EKG is unremarkable. We'll recheck   ----------------------------------------- 12:34 PM on 03/22/2016 -----------------------------------------  Second troponin has normalized. Discussed with Dr. Lady Gary of cardiology, patient appropriate for  d/c ____________________________________________   FINAL CLINICAL IMPRESSION(S) / ED DIAGNOSES  Final diagnoses:  Chest pain, unspecified type      NEW MEDICATIONS STARTED DURING THIS VISIT:  New Prescriptions   No medications on file     Note:  This document was prepared using Dragon voice recognition software and may include unintentional dictation errors.    Jene Everyobert Amilcar Reever, MD 03/22/16 [phone]

## 2016-05-05 ENCOUNTER — Telehealth: Payer: Self-pay

## 2016-05-05 DIAGNOSIS — F331 Major depressive disorder, recurrent, moderate: Secondary | ICD-10-CM

## 2016-05-05 MED ORDER — TRAZODONE HCL 50 MG PO TABS: 25.0000 mg | ORAL_TABLET | Freq: Every evening | ORAL | 0 refills | Status: DC | PRN

## 2016-05-05 MED ORDER — ALPRAZOLAM 0.5 MG PO TABS: 0.5000 mg | ORAL_TABLET | Freq: Two times a day (BID) | ORAL | 0 refills | Status: DC | PRN

## 2016-05-05 NOTE — Telephone Encounter (Signed)
Patient is scheduled for 05/29/16. Please give enough to get to her appointment.

## 2016-05-05 NOTE — Telephone Encounter (Signed)
OK to call in the xanax- Rx for trazodone sent to her pharmacy

## 2016-05-05 NOTE — Telephone Encounter (Signed)
I have never written either of those medicines for her and I see that Dr. Juliann Paresallwood has written it for her in the past and I can get her a refill until her appointment, but she will need an appointment and I will only get her enough to make it to her appointment.

## 2016-05-05 NOTE — Telephone Encounter (Signed)
Will hold off on xanax for now. If she needs refill closer to appointment, will get her refill then.

## 2016-05-05 NOTE — Telephone Encounter (Signed)
Medication not called into [facility] [address]  Last filled on 04/25/16 for 60, which is a 30 day supply.

## 2016-05-05 NOTE — Telephone Encounter (Signed)
Patient needed a refill on her Xanax and Trazadone. Patient she was in the hospital and you gave this to her.  Dr. Juliann Paresallwood gave her a refill because she didn't have any refills left.   Patient said she has some Xanax left but is completely out of trazadone.  Patient said she and her husband are getting divorced and it's a really hard time.  Patient said that if she didn't have these medications for her anxiety and sleep, she'd probably end up back in the ER.   Explained I would send this information to her CMA and Dr. Laural BenesJohnson.

## 2016-05-05 NOTE — Telephone Encounter (Signed)
Hold off on sending that just yet-- please check with her pharmacy. It looks like she just had the xanax filled on 04/25/16, meaning she should have 20 pills left. If that is when it got filled, do not call in the xanax as she will only need 6 pills- but she can have the trazodone.

## 2016-05-29 ENCOUNTER — Ambulatory Visit (INDEPENDENT_AMBULATORY_CARE_PROVIDER_SITE_OTHER): Payer: Medicaid Other | Admitting: Family Medicine

## 2016-05-29 ENCOUNTER — Telehealth: Payer: Self-pay | Admitting: Family Medicine

## 2016-05-29 ENCOUNTER — Encounter: Payer: Self-pay | Admitting: Family Medicine

## 2016-05-29 VITALS — BP 172/94 | HR 58 | Temp 97.7°F | Ht 66.0 in | Wt 151.6 lb

## 2016-05-29 DIAGNOSIS — N3001 Acute cystitis with hematuria: Secondary | ICD-10-CM

## 2016-05-29 DIAGNOSIS — E782 Mixed hyperlipidemia: Secondary | ICD-10-CM

## 2016-05-29 DIAGNOSIS — Z79899 Other long term (current) drug therapy: Secondary | ICD-10-CM | POA: Insufficient documentation

## 2016-05-29 DIAGNOSIS — B9689 Other specified bacterial agents as the cause of diseases classified elsewhere: Secondary | ICD-10-CM

## 2016-05-29 DIAGNOSIS — Z0000 Encounter for general adult medical examination without abnormal findings: Secondary | ICD-10-CM

## 2016-05-29 DIAGNOSIS — R202 Paresthesia of skin: Secondary | ICD-10-CM

## 2016-05-29 DIAGNOSIS — Z124 Encounter for screening for malignant neoplasm of cervix: Secondary | ICD-10-CM

## 2016-05-29 DIAGNOSIS — I129 Hypertensive chronic kidney disease with stage 1 through stage 4 chronic kidney disease, or unspecified chronic kidney disease: Secondary | ICD-10-CM

## 2016-05-29 DIAGNOSIS — Z23 Encounter for immunization: Secondary | ICD-10-CM

## 2016-05-29 DIAGNOSIS — N898 Other specified noninflammatory disorders of vagina: Secondary | ICD-10-CM

## 2016-05-29 DIAGNOSIS — F331 Major depressive disorder, recurrent, moderate: Secondary | ICD-10-CM

## 2016-05-29 DIAGNOSIS — N644 Mastodynia: Secondary | ICD-10-CM

## 2016-05-29 DIAGNOSIS — R300 Dysuria: Secondary | ICD-10-CM

## 2016-05-29 DIAGNOSIS — N760 Acute vaginitis: Secondary | ICD-10-CM

## 2016-05-29 LAB — WET PREP FOR TRICH, YEAST, CLUE
Clue Cell Exam: POSITIVE — AB
Trichomonas Exam: NEGATIVE
Yeast Exam: NEGATIVE

## 2016-05-29 MED ORDER — ATORVASTATIN CALCIUM 80 MG PO TABS: 80.0000 mg | ORAL_TABLET | Freq: Every day | ORAL | 3 refills | Status: DC

## 2016-05-29 MED ORDER — FLUOXETINE HCL 20 MG PO TABS: 20.0000 mg | ORAL_TABLET | Freq: Every day | ORAL | 1 refills | Status: DC

## 2016-05-29 MED ORDER — METRONIDAZOLE 500 MG PO TABS: 500.0000 mg | ORAL_TABLET | Freq: Two times a day (BID) | ORAL | 0 refills | Status: DC

## 2016-05-29 MED ORDER — METOPROLOL TARTRATE 50 MG PO TABS: 25.0000 mg | ORAL_TABLET | Freq: Two times a day (BID) | ORAL | 3 refills | Status: DC

## 2016-05-29 MED ORDER — SPIRONOLACTONE 25 MG PO TABS: 25.0000 mg | ORAL_TABLET | Freq: Every day | ORAL | 1 refills | Status: DC

## 2016-05-29 MED ORDER — TRAZODONE HCL 50 MG PO TABS: 50.0000 mg | ORAL_TABLET | Freq: Every evening | ORAL | 3 refills | Status: DC | PRN

## 2016-05-29 MED ORDER — CIPROFLOXACIN HCL 500 MG PO TABS: 500.0000 mg | ORAL_TABLET | Freq: Two times a day (BID) | ORAL | 0 refills | Status: DC

## 2016-05-29 MED ORDER — LISINOPRIL 10 MG PO TABS: 10.0000 mg | ORAL_TABLET | Freq: Every day | ORAL | 3 refills | Status: DC

## 2016-05-29 MED ORDER — LORAZEPAM 0.5 MG PO TABS: 0.5000 mg | ORAL_TABLET | Freq: Two times a day (BID) | ORAL | 0 refills | Status: DC | PRN

## 2016-05-29 NOTE — Assessment & Plan Note (Signed)
For ativan. Signed today and in to be scanned.

## 2016-05-29 NOTE — Telephone Encounter (Signed)
If she is having a rash or any trouble breathing she should be seen or go to the urgent care

## 2016-05-29 NOTE — Progress Notes (Signed)
BP (!) 172/94 (BP Location: Left Arm, Cuff Size: Normal)   Pulse (!) 58   Temp 97.7 F (36.5 C)   Ht 5\' 6"  (1.676 m)   Wt 151 lb 9.6 oz (68.8 kg)   LMP 04/26/2016 (Approximate)   SpO2 97%   BMI 24.47 kg/m    Subjective:    Patient ID: Natalie Taylor, female    DOB: 06-Oct-1960, 56 y.o.   MRN: [id number]  HPI: Natalie Taylor is a 56 y.o. female presenting on 05/29/2016 for comprehensive medical examination. Current medical complaints include:  HYPERTENSION / HYPERLIPIDEMIA- following with Dr. Juliann Pares Satisfied with current treatment? yes Duration of hypertension: chronic BP monitoring frequency: not checking BP medication side effects: no Past BP meds: lisinopril, metoprolol Duration of hyperlipidemia: chronic Cholesterol medication side effects: no Cholesterol supplements: none Past cholesterol medications: atorvastain (lipitor) Medication compliance: excellent compliance Aspirin: no Recent stressors: yes Recurrent headaches: yes Visual changes: yes Palpitations: yes Dyspnea: yes Chest pain: yes Lower extremity edema: no Dizzy/lightheaded: yes  DEPRESSION- Didn't take the prozac. Had a bad reaction to the sertraline. Still not feeling like herself. She has been seeing counselor and finds that helpful Mood status: uncontrolled Satisfied with current treatment?: no Symptom severity: severe  Duration of current treatment : Hasn't been taking anything Side effects: no Medication compliance: poor compliance Psychotherapy/counseling: yes current Previous psychiatric medications: trazodone, xanax Depressed mood: yes Anxious mood: yes Anhedonia: no Significant weight loss or gain: yes Insomnia: yes hard to fall asleep Fatigue: yes Feelings of worthlessness or guilt: yes Impaired concentration/indecisiveness: yes Suicidal ideations: no Hopelessness: yes Crying spells: yes Depression screen [facility] 2/9 05/29/2016 11/15/2015 11/01/2015  Decreased Interest 0 2 3  Down,  Depressed, Hopeless 3 1 3   PHQ - 2 Score 3 3 6   Altered sleeping 3 1 3   Tired, decreased energy 1 1 3   Change in appetite 3 1 3   Feeling bad or failure about yourself  2 1 3   Trouble concentrating 2 1 3   Moving slowly or fidgety/restless 2 0 2  Suicidal thoughts 3 0 1  PHQ-9 Score 19 8 24   Difficult doing work/chores - - Extremely dIfficult   She currently lives with: kids Menopausal Symptoms: yes  Past Medical History:  Past Medical History:  Diagnosis Date  . Arthritis    hands  . COPD (chronic obstructive pulmonary disease) (HCC)   . Coronary artery disease   . GERD (gastroesophageal reflux disease)   . Headache    daily  . Hyperlipidemia   . Hypertension   . MI (myocardial infarction)    x 2  . Shortness of breath dyspnea    pt attributes to meds  . Sleep apnea     Surgical History:  Past Surgical History:  Procedure Laterality Date  . CARDIAC CATHETERIZATION N/A 09/27/2015   Procedure: Left Heart Cath and Coronary Angiography;  Surgeon: Alwyn Pea, MD;  Location: [facility] INVASIVE CV [facility];  Service: Cardiovascular;  Laterality: N/A;  . CARDIAC CATHETERIZATION N/A 09/27/2015   Procedure: Coronary Stent Intervention;  Surgeon: Alwyn Pea, MD;  Location: [facility] INVAS[facility] CV [facility];  Service: Cardiovascular;  Laterality: N/A;  . Cardiac stents  09/27/2015  . CHOLECYSTECTOMY    . COLONOSCOPY WITH PROPOFOL N/A 11/19/2015   Procedure: COLONOSCOPY WITH PROPOFOL;  Surgeon: Midge Minium, MD;  Location: [facility];  Service: Endoscopy;  Laterality: N/A;  . INDUCED ABORTION    . MULTIPLE TOOTH EXTRACTIONS    . TUBAL LIGATION  Medications:  Current Outpatient Prescriptions on File Prior to Visit  Medication Sig  . ticagrelor (BRILINTA) 90 MG TABS tablet Take 1 tablet (90 mg total) by mouth 2 (two) times daily.   No current facility-administered medications on file prior to visit.     Allergies:  Allergies  Allergen Reactions  . Cranberry Hives     Cranberry juice  . Tape Rash    Social History:  Social History   Social History  . Marital status: Divorced    Spouse name: N/A  . Number of children: N/A  . Years of education: N/A   Occupational History  . Not on file.   Social History Main Topics  . Smoking status: Current Every Day Smoker    Packs/day: 0.50    Years: 40.00    Types: Cigarettes  . Smokeless tobacco: Never Used  . Alcohol use No  . Drug use: No  . Sexual activity: Yes   Other Topics Concern  . Not on file   Social History Narrative  . No narrative on file   History  Smoking Status  . Current Every Day Smoker  . Packs/day: 0.50  . Years: 40.00  . Types: Cigarettes  Smokeless Tobacco  . Never Used   History  Alcohol Use No    Family History:  Family History  Problem Relation Age of Onset  . Alcohol abuse Mother   . Arthritis Mother   . Asthma Mother   . Cancer Mother   . Hypertension Mother   . Migraines Mother   . Hyperlipidemia Sister   . Hyperlipidemia Brother   . Heart disease Maternal Grandmother   . Emphysema Maternal Grandfather     Past medical history, surgical history, medications, allergies, family history and social history reviewed with patient today and changes made to appropriate areas of the chart.   Review of Systems  Constitutional: Positive for chills, diaphoresis and malaise/fatigue. Negative for fever and weight loss.  HENT: Positive for tinnitus. Negative for congestion, ear discharge, ear pain, hearing loss, nosebleeds, sinus pain and sore throat.   Eyes: Positive for blurred vision. Negative for double vision, photophobia, pain, discharge and redness.  Respiratory: Positive for cough and shortness of breath. Negative for hemoptysis, sputum production, wheezing and stridor.   Cardiovascular: Positive for chest pain and palpitations. Negative for orthopnea, claudication, leg swelling and PND.  Gastrointestinal: Positive for abdominal pain, constipation,  heartburn, nausea and vomiting. Negative for blood in stool, diarrhea and melena.  Genitourinary: Positive for dysuria, frequency and urgency. Negative for flank pain and hematuria.  Musculoskeletal: Positive for joint pain. Negative for back pain, falls, myalgias and neck pain.  Skin: Negative.   Neurological: Positive for dizziness, tingling and headaches. Negative for tremors, sensory change, speech change, focal weakness, seizures, loss of consciousness and weakness.  Endo/Heme/Allergies: Positive for polydipsia. Negative for environmental allergies. Bruises/bleeds easily.  Psychiatric/Behavioral: Positive for depression. Negative for hallucinations, memory loss, substance abuse and suicidal ideas. The patient is nervous/anxious and has insomnia.     All other ROS negative except what is listed above and in the HPI.      Objective:    BP (!) 172/94 (BP Location: Left Arm, Cuff Size: Normal)   Pulse (!) 58   Temp 97.7 F (36.5 C)   Ht 5\' 6"  (1.676 m)   Wt 151 lb 9.6 oz (68.8 kg)   LMP 04/26/2016 (Approximate)   SpO2 97%   BMI 24.47 kg/m   Wt Readings from Last 3  Encounters:  05/29/16 151 lb 9.6 oz (68.8 kg)  03/22/16 140 lb (63.5 kg)  03/21/16 140 lb (63.5 kg)    Physical Exam  Constitutional: She is oriented to person, place, and time. She appears well-developed and well-nourished. No distress.  HENT:  Head: Normocephalic and atraumatic.  Right Ear: Hearing, tympanic membrane, external ear and ear canal normal.  Left Ear: Hearing, tympanic membrane, external ear and ear canal normal.  Nose: Nose normal.  Mouth/Throat: Uvula is midline, oropharynx is clear and moist and mucous membranes are normal. No oropharyngeal exudate.  Eyes: Conjunctivae, EOM and lids are normal. Pupils are equal, round, and reactive to light. Right eye exhibits no discharge. Left eye exhibits no discharge. No scleral icterus.  Neck: Normal range of motion. Neck supple. No JVD present. No tracheal  deviation present. No thyromegaly present.  Cardiovascular: Normal rate, regular rhythm, normal heart sounds and intact distal pulses.  Exam reveals no gallop and no friction rub.   No murmur heard. Pulmonary/Chest: Effort normal and breath sounds normal. No stridor. No respiratory distress. She has no wheezes. She has no rales. She exhibits no tenderness. Right breast exhibits no inverted nipple, no mass, no nipple discharge, no skin change and no tenderness. Left breast exhibits no inverted nipple, no mass, no nipple discharge, no skin change and no tenderness. Breasts are symmetrical.  Abdominal: Soft. Bowel sounds are normal. She exhibits no distension and no mass. There is no tenderness. There is no rebound and no guarding. Hernia confirmed negative in the right inguinal area and confirmed negative in the left inguinal area.  Genitourinary: Uterus normal. No labial fusion. There is no rash, tenderness, lesion or injury on the right labia. There is no rash, tenderness, lesion or injury on the left labia. Uterus is not deviated, not enlarged, not fixed and not tender. Cervix exhibits no motion tenderness, no discharge and no friability. Right adnexum displays no mass, no tenderness and no fullness. Left adnexum displays no mass, no tenderness and no fullness. No erythema, tenderness or bleeding in the vagina. No foreign body in the vagina. No signs of injury around the vagina. Vaginal discharge found.  Musculoskeletal: Normal range of motion. She exhibits no edema, tenderness or deformity.  Lymphadenopathy:    She has no cervical adenopathy.  Neurological: She is alert and oriented to person, place, and time. She has normal reflexes. She displays normal reflexes. No cranial nerve deficit. She exhibits normal muscle tone. Coordination normal.  Skin: Skin is warm, dry and intact. No rash noted. She is not diaphoretic. No erythema. No pallor.  Bruising on arms  Psychiatric: She has a normal mood and  affect. Her speech is normal and behavior is normal. Judgment and thought content normal. Cognition and memory are normal.  Nursing note and vitals reviewed.   Results for orders placed or performed in visit on 05/29/16  WET PREP FOR TRICH, YEAST, CLUE  Result Value Ref Range   Trichomonas Exam Negative Negative   Yeast Exam Negative Negative   Clue Cell Exam Positive (A) Negative  Microscopic Examination  Result Value Ref Range   WBC, UA 11-30 (A) 0 - 5 /hpf   RBC, UA None seen 0 - 2 /hpf   Epithelial Cells (non renal) 0-10 0 - 10 /hpf   Crystals Present (A) N/A   Crystal Type Amorphous Sediment N/A   Bacteria, UA Many (A) None seen/Few   Yeast, UA Present (A) None seen  Bayer DCA Hb A1c Waived  Result  Value Ref Range   Bayer DCA Hb A1c Waived 5.0 <7.0 %  Microalbumin, Urine Waived  Result Value Ref Range   Microalb, Ur Waived 150 (H) 0 - 19 mg/L   Creatinine, Urine Waived 100 10 - 300 mg/dL   Microalb/Creat Ratio >300 (H) <30 mg/g  UA/M w/rflx Culture, Routine  Result Value Ref Range   Specific Gravity, UA 1.010 1.005 - 1.030   pH, UA 7.0 5.0 - 7.5   Color, UA Yellow Yellow   Appearance Ur Cloudy (A) Clear   Leukocytes, UA 2+ (A) Negative   Protein, UA 2+ (A) Negative/Trace   Glucose, UA Negative Negative   Ketones, UA Negative Negative   RBC, UA Trace (A) Negative   Bilirubin, UA Negative Negative   Urobilinogen, Ur 0.2 0.2 - 1.0 mg/dL   Nitrite, UA Positive (A) Negative   Microscopic Examination See below:    Urinalysis Reflex Comment   Urine Culture, Routine  Result Value Ref Range   Urine Culture, Routine WILL FOLLOW       Assessment & Plan:   Problem List Items Addressed This Visit      Genitourinary   Benign hypertensive renal disease    Not under good control. Dr. Juliann Pa[place] recommended spironalactone. Will start that and recheck in 2 weeks.       Relevant Orders   CBC with Differential/Platelet   Comprehensive metabolic panel   Microalbumin, Urine  Waived (Completed)     Other   HLD (hyperlipidemia)    Rechecking levels today. Await results.       Relevant Medications   metoprolol (LOPRESSOR) 50 MG tablet   lisinopril (PRINIVIL,ZESTRIL) 10 MG tablet   atorvastatin (LIPITOR) 80 MG tablet   spironolactone (ALDACTONE) 25 MG tablet   Other Relevant Orders   Comprehensive metabolic panel   Lipid Panel w/o Chol/HDL Ratio   Depression    Not under good control. On no preventative medicine. Will start her on prozac and recheck in 2 weeks. Continue to follow with counselor. Having significant panic attacks. Will treat with ativan rather than xanax. Discussed with patient that she cannot take more than 2 a day and only when she really needs it. Continue to monitor closely.      Relevant Medications   traZODone (DESYREL) 50 MG tablet   FLUoxetine (PROZAC) 20 MG tablet   LORazepam (ATIVAN) 0.5 MG tablet   Other Relevant Orders   CBC with Differential/Platelet   Comprehensive metabolic panel   TSH   Controlled substance agreement signed    For ativan. Signed today and in to be scanned.       Other Visit Diagnoses    Routine general medical examination at a health care facility    -  Primary   Vaccines up dated. Screening labs done today. Pap done today. Mammogram ordered. Colonoscopy   Relevant Orders   Bayer DCA Hb A1c Waived (Completed)   CBC with Differential/Platelet   Comprehensive metabolic panel   Lipid Panel w/o Chol/HDL Ratio   Microalbumin, Urine Waived (Completed)   TSH   UA/M w/rflx Culture, Routine (Completed)   Paresthesia       Will check labs. Await results.    Relevant Orders   Bayer DCA Hb A1c Waived (Completed)   Comprehensive metabolic panel   TSH   Dysuria       +UTI   Relevant Orders   UA/M w/rflx Culture, Routine (Completed)   Immunization due       Pneumovax and  flu given today.   Relevant Orders   Pneumococcal polysaccharide vaccine 23-valent greater than or equal to 2yo subcutaneous/IM  (Completed)   Flu Vaccine QUAD 36+ mos PF IM (Fluarix & Fluzone Quad PF) (Completed)   Screening for cervical cancer       Pap done today.   Relevant Orders   IGP, Aptima HPV, rfx 16/18,45   Vaginal discharge       +BV   Relevant Orders   WET PREP FOR TRICH, YEAST, CLUE (Completed)   BV (bacterial vaginosis)       Will treat with flagyl. Call with any problems or if not getting better.    Relevant Medications   metroNIDAZOLE (FLAGYL) 500 MG tablet   Breast pain       Will get diagnostic mammogram, exam normal today. Await results.    Relevant Orders   US BREAST LTD UNI LEFT INC AXILLA   US BREAST LTD UNI RIGHT INC AXILLA   MM DIAG BREAST TOMO BILATERAL   Acute cystitis with hematuria       Will treat with cipro. Call with any problems.        Follow up plan: Return in about 2 weeks (around 06/12/2016) for follow up BP and mood.   LABORATORY TESTING:  - Pap smear: pap done  IMMUNIZATIONS:   - Tdap: Tetanus vaccination status reviewed: last tetanus booster within 10 years. - Influenza: Administered today - Pneumovax: Administered today - Prevnar: N/A - Zostavax vaccine: Not applicable  SCREENING: -Mammogram: Ordered today  - Colonoscopy: Up to date  - Bone Density: Not applicable  -Hearing Test: Not applicable  -Spirometry: Will do next visit   PATIENT COUNSELING:   Advised to take 1 mg of folate supplement per day if capable of pregnancy.   Sexuality: Discussed sexually transmitted diseases, partner selection, use of condoms, avoidance of unintended pregnancy  and contraceptive alternatives.   Advised to avoid cigarette smoking.  I discussed with the patient that most people either abstain from alcohol or drink within safe limits (<=14/week and <=4 drinks/occasion for males, <=7/weeks and <= 3 drinks/occasion for females) and that the risk for alcohol disorders and other health effects rises proportionally with the number of drinks per week and how often a drinker  exceeds daily limits.  Discussed cessation/primary prevention of drug use and availability of treatment for abuse.   Diet: Encouraged to adjust caloric intake to maintain  or achieve ideal body weight, to reduce intake of dietary saturated fat and total fat, to limit sodium intake by avoiding high sodium foods and not adding table salt, and to maintain adequate dietary potassium and calcium preferably from fresh fruits, vegetables, and low-fat dairy products.    stressed the importance of regular exercise  Injury prevention: Discussed safety belts, safety helmets, smoke detector, smoking near bedding or upholstery.   Dental health: Discussed importance of regular tooth brushing, flossing, and dental visits.    NEXT PREVENTATIVE PHYSICAL DUE IN 1 YEAR. Return in about 2 weeks (around 06/12/2016) for follow up BP and mood.

## 2016-05-29 NOTE — Patient Instructions (Addendum)
Health Maintenance, Female Introduction Adopting a healthy lifestyle and getting preventive care can go a long way to promote health and wellness. Talk with your health care provider about what schedule of regular examinations is right for you. This is a good chance for you to check in with your provider about disease prevention and staying healthy. In between checkups, there are plenty of things you can do on your own. Experts have done a lot of research about which lifestyle changes and preventive measures are most likely to keep you healthy. Ask your health care provider for more information. Weight and diet Eat a healthy diet  Be sure to include plenty of vegetables, fruits, low-fat dairy products, and lean protein.  Do not eat a lot of foods high in solid fats, added sugars, or salt.  Get regular exercise. This is one of the most important things you can do for your health.  Most adults should exercise for at least 150 minutes each week. The exercise should increase your heart rate and make you sweat (moderate-intensity exercise).  Most adults should also do strengthening exercises at least twice a week. This is in addition to the moderate-intensity exercise. Maintain a healthy weight  Body mass index (BMI) is a measurement that can be used to identify possible weight problems. It estimates body fat based on height and weight. Your health care provider can help determine your BMI and help you achieve or maintain a healthy weight.  For females 63 years of age and older:  A BMI below 18.5 is considered underweight.  A BMI of 18.5 to 24.9 is normal.  A BMI of 25 to 29.9 is considered overweight.  A BMI of 30 and above is considered obese. Watch levels of cholesterol and blood lipids  You should start having your blood tested for lipids and cholesterol at 54 years of age, then have this test every 5 years.  You may need to have your cholesterol levels checked more often if:  Your  lipid or cholesterol levels are high.  You are older than 54 years of age.  You are at high risk for heart disease. Cancer screening Lung Cancer  Lung cancer screening is recommended for adults 56-22 years old who are at high risk for lung cancer because of a history of smoking.  A yearly low-dose CT scan of the lungs is recommended for people who:  Currently smoke.  Have quit within the past 15 years.  Have at least a 30-pack-year history of smoking. A pack year is smoking an average of one pack of cigarettes a day for 1 year.  Yearly screening should continue until it has been 15 years since you quit.  Yearly screening should stop if you develop a health problem that would prevent you from having lung cancer treatment. Breast Cancer  Practice breast self-awareness. This means understanding how your breasts normally appear and feel.  It also means doing regular breast self-exams. Let your health care provider know about any changes, no matter how small.  If you are in your 20s or 30s, you should have a clinical breast exam (CBE) by a health care provider every 1-3 years as part of a regular health exam.  If you are 35 or older, have a CBE every year. Also consider having a breast X-ray (mammogram) every year.  If you have a family history of breast cancer, talk to your health care provider about genetic screening.  If you are at high risk for breast cancer,  talk to your health care provider about having an MRI and a mammogram every year.  Breast cancer gene (BRCA) assessment is recommended for women who have family members with BRCA-related cancers. BRCA-related cancers include:  Breast.  Ovarian.  Tubal.  Peritoneal cancers.  Results of the assessment will determine the need for genetic counseling and BRCA1 and BRCA2 testing. Cervical Cancer  Your health care provider may recommend that you be screened regularly for cancer of the pelvic organs (ovaries, uterus, and  vagina). This screening involves a pelvic examination, including checking for microscopic changes to the surface of your cervix (Pap test). You may be encouraged to have this screening done every 3 years, beginning at age 21.  For women ages 30-65, health care providers may recommend pelvic exams and Pap testing every 3 years, or they may recommend the Pap and pelvic exam, combined with testing for human papilloma virus (HPV), every 5 years. Some types of HPV increase your risk of cervical cancer. Testing for HPV may also be done on women of any age with unclear Pap test results.  Other health care providers may not recommend any screening for nonpregnant women who are considered low risk for pelvic cancer and who do not have symptoms. Ask your health care provider if a screening pelvic exam is right for you.  If you have had past treatment for cervical cancer or a condition that could lead to cancer, you need Pap tests and screening for cancer for at least 20 years after your treatment. If Pap tests have been discontinued, your risk factors (such as having a new sexual partner) need to be reassessed to determine if screening should resume. Some women have medical problems that increase the chance of getting cervical cancer. In these cases, your health care provider may recommend more frequent screening and Pap tests. Colorectal Cancer  This type of cancer can be detected and often prevented.  Routine colorectal cancer screening usually begins at 54 years of age and continues through 54 years of age.  Your health care provider may recommend screening at an earlier age if you have risk factors for colon cancer.  Your health care provider may also recommend using home test kits to check for hidden blood in the stool.  A small camera at the end of a tube can be used to examine your colon directly (sigmoidoscopy or colonoscopy). This is done to check for the earliest forms of colorectal  cancer.  Routine screening usually begins at age 50.  Direct examination of the colon should be repeated every 5-10 years through 54 years of age. However, you may need to be screened more often if early forms of precancerous polyps or small growths are found. Skin Cancer  Check your skin from head to toe regularly.  Tell your health care provider about any new moles or changes in moles, especially if there is a change in a mole's shape or color.  Also tell your health care provider if you have a mole that is larger than the size of a pencil eraser.  Always use sunscreen. Apply sunscreen liberally and repeatedly throughout the day.  Protect yourself by wearing long sleeves, pants, a wide-brimmed hat, and sunglasses whenever you are outside. Heart disease, diabetes, and high blood pressure  High blood pressure causes heart disease and increases the risk of stroke. High blood pressure is more likely to develop in:  People who have blood pressure in the high end of the normal range (130-139/85-89 mm Hg).    People who are overweight or obese.  People who are African American.  If you are 18-39 years of age, have your blood pressure checked every 3-5 years. If you are 40 years of age or older, have your blood pressure checked every year. You should have your blood pressure measured twice-once when you are at a hospital or clinic, and once when you are not at a hospital or clinic. Record the average of the two measurements. To check your blood pressure when you are not at a hospital or clinic, you can use:  An automated blood pressure machine at a pharmacy.  A home blood pressure monitor.  If you are between 55 years and 79 years old, ask your health care provider if you should take aspirin to prevent strokes.  Have regular diabetes screenings. This involves taking a blood sample to check your fasting blood sugar level.  If you are at a normal weight and have a low risk for diabetes,  have this test once every three years after 54 years of age.  If you are overweight and have a high risk for diabetes, consider being tested at a younger age or more often. Preventing infection Hepatitis B  If you have a higher risk for hepatitis B, you should be screened for this virus. You are considered at high risk for hepatitis B if:  You were born in a country where hepatitis B is common. Ask your health care provider which countries are considered high risk.  Your parents were born in a high-risk country, and you have not been immunized against hepatitis B (hepatitis B vaccine).  You have HIV or AIDS.  You use needles to inject street drugs.  You live with someone who has hepatitis B.  You have had sex with someone who has hepatitis B.  You get hemodialysis treatment.  You take certain medicines for conditions, including cancer, organ transplantation, and autoimmune conditions. Hepatitis C  Blood testing is recommended for:  Everyone born from 1945 through 1965.  Anyone with known risk factors for hepatitis C. Sexually transmitted infections (STIs)  You should be screened for sexually transmitted infections (STIs) including gonorrhea and chlamydia if:  You are sexually active and are younger than 54 years of age.  You are older than 54 years of age and your health care provider tells you that you are at risk for this type of infection.  Your sexual activity has changed since you were last screened and you are at an increased risk for chlamydia or gonorrhea. Ask your health care provider if you are at risk.  If you do not have HIV, but are at risk, it may be recommended that you take a prescription medicine daily to prevent HIV infection. This is called pre-exposure prophylaxis (PrEP). You are considered at risk if:  You are sexually active and do not regularly use condoms or know the HIV status of your partner(s).  You take drugs by injection.  You are sexually  active with a partner who has HIV. Talk with your health care provider about whether you are at high risk of being infected with HIV. If you choose to begin PrEP, you should first be tested for HIV. You should then be tested every 3 months for as long as you are taking PrEP. Pregnancy  If you are premenopausal and you may become pregnant, ask your health care provider about preconception counseling.  If you may become pregnant, take 400 to 800 micrograms (mcg) of folic acid   every day.  If you want to prevent pregnancy, talk to your health care provider about birth control (contraception). Osteoporosis and menopause  Osteoporosis is a disease in which the bones lose minerals and strength with aging. This can result in serious bone fractures. Your risk for osteoporosis can be identified using a bone density scan.  If you are 65 years of age or older, or if you are at risk for osteoporosis and fractures, ask your health care provider if you should be screened.  Ask your health care provider whether you should take a calcium or vitamin D supplement to lower your risk for osteoporosis.  Menopause may have certain physical symptoms and risks.  Hormone replacement therapy may reduce some of these symptoms and risks. Talk to your health care provider about whether hormone replacement therapy is right for you. Follow these instructions at home:  Schedule regular health, dental, and eye exams.  Stay current with your immunizations.  Do not use any tobacco products including cigarettes, chewing tobacco, or electronic cigarettes.  If you are pregnant, do not drink alcohol.  If you are breastfeeding, limit how much and how often you drink alcohol.  Limit alcohol intake to no more than 1 drink per day for nonpregnant women. One drink equals 12 ounces of beer, 5 ounces of wine, or 1 ounces of hard liquor.  Do not use street drugs.  Do not share needles.  Ask your health care provider for  help if you need support or information about quitting drugs.  Tell your health care provider if you often feel depressed.  Tell your health care provider if you have ever been abused or do not feel safe at home. This information is not intended to replace advice given to you by your health care provider. Make sure you discuss any questions you have with your health care provider. Document Released: 10/21/2010 Document Revised: 09/13/2015 Document Reviewed: 01/09/2015  2017 Elsevier  Bacterial Vaginosis Bacterial vaginosis is an infection of the vagina. It happens when too many germs (bacteria) grow in the vagina. This infection puts you at risk for infections from sex (STIs). Treating this infection can lower your risk for some STIs. You should also treat this if you are pregnant. It can cause your baby to be born early. Follow these instructions at home: Medicines  Take over-the-counter and prescription medicines only as told by your doctor.  Take or use your antibiotic medicine as told by your doctor. Do not stop taking or using it even if you start to feel better. General instructions  If you your sexual partner is a woman, tell her that you have this infection. She needs to get treatment if she has symptoms. If you have a female partner, he does not need to be treated.  During treatment:  Avoid sex.  Do not douche.  Avoid alcohol as told.  Avoid breastfeeding as told.  Drink enough fluid to keep your pee (urine) clear or pale yellow.  Keep your vagina and butt (rectum) clean.  Wash the area with warm water every day.  Wipe from front to back after you use the toilet.  Keep all follow-up visits as told by your doctor. This is important. Preventing this condition  Do not douche.  Use only warm water to wash around your vagina.  Use protection when you have sex. This includes:  Latex condoms.  Dental dams.  Limit how many people you have sex with. It is best to  only have sex   with the same person (be monogamous).  Get tested for STIs. Have your partner get tested.  Wear underwear that is cotton or lined with cotton.  Avoid tight pants and pantyhose. This is most important in summer.  Do not use any products that have nicotine or tobacco in them. These include cigarettes and e-cigarettes. If you need help quitting, ask your doctor.  Do not use illegal drugs.  Limit how much alcohol you drink. Contact a doctor if:  Your symptoms do not get better, even after you are treated.  You have more discharge or pain when you pee (urinate).  You have a fever.  You have pain in your belly (abdomen).  You have pain with sex.  Your bleed from your vagina between periods. Summary  This infection happens when too many germs (bacteria) grow in the vagina.  Treating this condition can lower your risk for some infections from sex (STIs).  You should also treat this if you are pregnant. It can cause early (premature) birth.  Do not stop taking or using your antibiotic medicine even if you start to feel better. This information is not intended to replace advice given to you by your health care provider. Make sure you discuss any questions you have with your health care provider. Document Released: 01/15/2008 Document Revised: 12/22/2015 Document Reviewed: 12/22/2015 Elsevier Interactive Patient Education  2017 [employer] Inc[employer]  Urinary Tract Infection, Adult Introduction A urinary tract infection (UTI) is an infection of any part of the urinary tract. The urinary tract includes the:  Kidneys.  Ureters.  Bladder.  Urethra. These organs make, store, and get rid of pee (urine) in the body. Follow these instructions at home:  Take over-the-counter and prescription medicines only as told by your doctor.  If you were prescribed an antibiotic medicine, take it as told by your doctor. Do not stop taking the antibiotic even if you start to feel  better.  Avoid the following drinks:  Alcohol.  Caffeine.  Tea.  Carbonated drinks.  Drink enough fluid to keep your pee clear or pale yellow.  Keep all follow-up visits as told by your doctor. This is important.  Make sure to:  Empty your bladder often and completely. Do not to hold pee for long periods of time.  Empty your bladder before and after sex.  Wipe from front to back after a bowel movement if you are female. Use each tissue one time when you wipe. Contact a doctor if:  You have back pain.  You have a fever.  You feel sick to your stomach (nauseous).  You throw up (vomit).  Your symptoms do not get better after 3 days.  Your symptoms go away and then come back. Get help right away if:  You have very bad back pain.  You have very bad lower belly (abdominal) pain.  You are throwing up and cannot keep down any medicines or water. This information is not intended to replace advice given to you by your health care provider. Make sure you discuss any questions you have with your health care provider. Document Released: 09/24/2007 Document Revised: 09/13/2015 Document Reviewed: 02/26/2015  2017 Elsevier Influenza (Flu) Vaccine (Inactivated or Recombinant): What You Need to Know 1. Why get vaccinated? Influenza ("flu") is a contagious disease that spreads around the [place] every year, usually between October and May. Flu is caused by influenza viruses, and is spread mainly by coughing, sneezing, and close contact. Anyone can get flu. Flu strikes suddenly and  can last several days. Symptoms vary by age, but can include:  fever/chills  sore throat  muscle aches  fatigue  cough  headache  runny or stuffy nose Flu can also lead to pneumonia and blood infections, and cause diarrhea and seizures in children. If you have a medical condition, such as heart or lung disease, flu can make it worse. Flu is more dangerous for some people. Infants and  young children, people 77 years of age and older, pregnant women, and people with certain health conditions or a weakened immune system are at greatest risk. Each year thousands of people in the [place] die from flu, and many more are hospitalized. Flu vaccine can:  keep you from getting flu,  make flu less severe if you do get it, and  keep you from spreading flu to your family and other people. 2. Inactivated and recombinant flu vaccines A dose of flu vaccine is recommended every flu season. Children 6 months through 28 years of age may need two doses during the same flu season. Everyone else needs only one dose each flu season. Some inactivated flu vaccines contain a very small amount of a mercury-based preservative called thimerosal. Studies have not shown thimerosal in vaccines to be harmful, but flu vaccines that do not contain thimerosal are available. There is no live flu virus in flu shots. They cannot cause the flu. There are many flu viruses, and they are always changing. Each year a new flu vaccine is made to protect against three or four viruses that are likely to cause disease in the upcoming flu season. But even when the vaccine doesn't exactly match these viruses, it may still provide some protection. Flu vaccine cannot prevent:  flu that is caused by a virus not covered by the vaccine, or  illnesses that look like flu but are not. It takes about 2 weeks for protection to develop after vaccination, and protection lasts through the flu season. 3. Some people should not get this vaccine Tell the person who is giving you the vaccine:  If you have any severe, life-threatening allergies. If you ever had a life-threatening allergic reaction after a dose of flu vaccine, or have a severe allergy to any part of this vaccine, you may be advised not to get vaccinated. Most, but not all, types of flu vaccine contain a small amount of egg protein.  If you ever had Guillain-Barr  Syndrome (also called GBS). Some people with a history of GBS should not get this vaccine. This should be discussed with your doctor.  If you are not feeling well. It is usually okay to get flu vaccine when you have a mild illness, but you might be asked to come back when you feel better. 4. Risks of a vaccine reaction With any medicine, including vaccines, there is a chance of reactions. These are usually mild and go away on their own, but serious reactions are also possible. Most people who get a flu shot do not have any problems with it. Minor problems following a flu shot include:  soreness, redness, or swelling where the shot was given  hoarseness  sore, red or itchy eyes  cough  fever  aches  headache  itching  fatigue If these problems occur, they usually begin soon after the shot and last 1 or 2 days. More serious problems following a flu shot can include the following:  There may be a small increased risk of Guillain-Barre Syndrome (GBS) after inactivated flu vaccine.  This risk has been estimated at 1 or 2 additional cases per million people vaccinated. This is much lower than the risk of severe complications from flu, which can be prevented by flu vaccine.  Young children who get the flu shot along with pneumococcal vaccine (PCV13) and/or DTaP vaccine at the same time might be slightly more likely to have a seizure caused by fever. Ask your doctor for more information. Tell your doctor if a child who is getting flu vaccine has ever had a seizure. Problems that could happen after any injected vaccine:  People sometimes faint after a medical procedure, including vaccination. Sitting or lying down for about 15 minutes can help prevent fainting, and injuries caused by a fall. Tell your doctor if you feel dizzy, or have vision changes or ringing in the ears.  Some people get severe pain in the shoulder and have difficulty moving the arm where a shot was given. This happens  very rarely.  Any medication can cause a severe allergic reaction. Such reactions from a vaccine are very rare, estimated at about 1 in a million doses, and would happen within a few minutes to a few hours after the vaccination. As with any medicine, there is a very remote chance of a vaccine causing a serious injury or death. The safety of vaccines is always being monitored. For more information, visit: http://www.aguilar.org/ 5. What if there is a serious reaction? What should I look for? Look for anything that concerns you, such as signs of a severe allergic reaction, very high fever, or unusual behavior. Signs of a severe allergic reaction can include hives, swelling of the face and throat, difficulty breathing, a fast heartbeat, dizziness, and weakness. These would start a few minutes to a few hours after the vaccination. What should I do?  If you think it is a severe allergic reaction or other emergency that can't wait, call 9-1-1 and get the person to the nearest hospital. Otherwise, call your doctor.  Reactions should be reported to the Vaccine Adverse Event Reporting System (VAERS). Your doctor should file this report, or you can do it yourself through the VAERS web site at www.vaers.SamedayNews.es, or by calling 825-270-6199.  VAERS does not give medical advice. 6. The National Vaccine Injury Compensation Program The [employer] Vaccine Injury Compensation Program (VICP) is a federal program that was created to compensate people who may have been injured by certain vaccines. Persons who believe they may have been injured by a vaccine can learn about the program and about filing a claim by calling (647)546-9309 or visiting the [place] website at GoldCloset.com.ee. There is a time limit to file a claim for compensation. 7. How can I learn more?  Ask your healthcare provider. He or she can give you the vaccine package insert or suggest other sources of information.  Call your  local or state health department.  Contact the Centers for Disease Control and Prevention (CDC):  Call (867) 201-8160 (1-800-CDC-INFO) or  Visit CDC's website at https://gibson.com/ Vaccine Information Statement, Inactivated Influenza Vaccine (11/25/2013) This information is not intended to replace advice given to you by your health care provider. Make sure you discuss any questions you have with your health care provider. Document Released: 01/30/2006 Document Revised: 12/27/2015 Document Reviewed: 12/27/2015 Elsevier Interactive Patient Education  2017 [place]. Pneumococcal Polysaccharide Vaccine: What You Need to Know 1. Why get vaccinated? Vaccination can protect older adults (and some children and younger adults) from pneumococcal disease. Pneumococcal disease is caused by bacteria that can  spread from person to person through close contact. It can cause ear infections, and it can also lead to more serious infections of the:  Lungs (pneumonia),  Blood (bacteremia), and  Covering of the brain and spinal cord (meningitis). Meningitis can cause deafness and brain damage, and it can be fatal. Anyone can get pneumococcal disease, but children under 35 years of age, people with certain medical conditions, adults over 66 years of age, and cigarette smokers are at the highest risk. About 18,000 older adults die each year from pneumococcal disease in the [place]. Treatment of pneumococcal infections with penicillin and other drugs used to be more effective. But some strains of the disease have become resistant to these drugs. This makes prevention of the disease, through vaccination, even more important. 2. Pneumococcal polysaccharide vaccine (PPSV23) Pneumococcal polysaccharide vaccine (PPSV23) protects against 23 types of pneumococcal bacteria. It will not prevent all pneumococcal disease. PPSV23 is recommended for:  All adults 81 years of age and older,  Anyone 2 through 54 years  of age with certain long-term health problems,  Anyone 2 through 54 years of age with a weakened immune system,  Adults 2 through 54 years of age who smoke cigarettes or have asthma. Most people need only one dose of PPSV. A second dose is recommended for certain high-risk groups. People 10 and older should get a dose even if they have gotten one or more doses of the vaccine before they turned 65. Your healthcare provider can give you more information about these recommendations. Most healthy adults develop protection within 2 to 3 weeks of getting the shot. 3. Some people should not get this vaccine  Anyone who has had a life-threatening allergic reaction to PPSV should not get another dose.  Anyone who has a severe allergy to any component of PPSV should not receive it. Tell your provider if you have any severe allergies.  Anyone who is moderately or severely ill when the shot is scheduled may be asked to wait until they recover before getting the vaccine. Someone with a mild illness can usually be vaccinated.  Children less than 72 years of age should not receive this vaccine.  There is no evidence that PPSV is harmful to either a pregnant woman or to her fetus. However, as a precaution, women who need the vaccine should be vaccinated before becoming pregnant, if possible. 4. Risks of a vaccine reaction With any medicine, including vaccines, there is a chance of side effects. These are usually mild and go away on their own, but serious reactions are also possible. About half of people who get PPSV have mild side effects, such as redness or pain where the shot is given, which go away within about two days. Less than 1 out of 100 people develop a fever, muscle aches, or more severe local reactions. Problems that could happen after any vaccine:  People sometimes faint after a medical procedure, including vaccination. Sitting or lying down for about 15 minutes can help prevent fainting, and  injuries caused by a fall. Tell your doctor if you feel dizzy, or have vision changes or ringing in the ears.  Some people get severe pain in the shoulder and have difficulty moving the arm where a shot was given. This happens very rarely.  Any medication can cause a severe allergic reaction. Such reactions from a vaccine are very rare, estimated at about 1 in a million doses, and would happen within a few minutes to a few hours after  the vaccination. As with any medicine, there is a very remote chance of a vaccine causing a serious injury or death. The safety of vaccines is always being monitored. For more information, visit: http://www.aguilar.org/ 5. What if there is a serious reaction? What should I look for? Look for anything that concerns you, such as signs of a severe allergic reaction, very high fever, or unusual behavior. Signs of a severe allergic reaction can include hives, swelling of the face and throat, difficulty breathing, a fast heartbeat, dizziness, and weakness. These would usually start a few minutes to a few hours after the vaccination. What should I do? If you think it is a severe allergic reaction or other emergency that can't wait, call 9-1-1 or get to the nearest hospital. Otherwise, call your doctor. Afterward, the reaction should be reported to the Vaccine Adverse Event Reporting System (VAERS). Your doctor might file this report, or you can do it yourself through the VAERS web site at www.vaers.SamedayNews.es, or by calling 319 857 0650. VAERS does not give medical advice. 6. How can I learn more?  Ask your doctor. He or she can give you the vaccine package insert or suggest other sources of information.  Call your local or state health department.  Contact the Centers for Disease Control and Prevention (CDC):  Call 515-662-9341 (1-800-CDC-INFO) or  Visit CDC's website at http://hunter.com/ CDC Pneumococcal Polysaccharide Vaccine VIS (08/12/13) This information  is not intended to replace advice given to you by your health care provider. Make sure you discuss any questions you have with your health care provider. Document Released: 02/02/2006 Document Revised: 12/27/2015 Document Reviewed: 12/27/2015 Elsevier Interactive Patient Education  2017 Elsevier Inc. Generalized Anxiety Disorder Generalized anxiety disorder (GAD) is a mental disorder. It interferes with life functions, including relationships, work, and school. GAD is different from normal anxiety, which everyone experiences at some point in their lives in response to specific life events and activities. Normal anxiety actually helps [place] prepare for and get through these life events and activities. Normal anxiety goes away after the event or activity is over.  GAD causes anxiety that is not necessarily related to specific events or activities. It also causes excess anxiety in proportion to specific events or activities. The anxiety associated with GAD is also difficult to control. GAD can vary from mild to severe. People with severe GAD can have intense waves of anxiety with physical symptoms (panic attacks).  SYMPTOMS The anxiety and worry associated with GAD are difficult to control. This anxiety and worry are related to many life events and activities and also occur more days than not for 6 months or longer. People with GAD also have three or more of the following symptoms (one or more in children):  Restlessness.   Fatigue.  Difficulty concentrating.   Irritability.  Muscle tension.  Difficulty sleeping or unsatisfying sleep. DIAGNOSIS GAD is diagnosed through an assessment by your health care provider. Your health care provider will ask you questions aboutyour mood,physical symptoms, and events in your life. Your health care provider may ask you about your medical history and use of alcohol or drugs, including prescription medicines. Your health care provider may also do a physical  exam and blood tests. Certain medical conditions and the use of certain substances can cause symptoms similar to those associated with GAD. Your health care provider may refer you to a mental health specialist for further evaluation. TREATMENT The following therapies are usually used to treat GAD:   Medication. Antidepressant medication usually is  prescribed for long-term daily control. Antianxiety medicines may be added in severe cases, especially when panic attacks occur.   Talk therapy (psychotherapy). Certain types of talk therapy can be helpful in treating GAD by providing support, education, and guidance. A form of talk therapy called cognitive behavioral therapy can teach you healthy ways to think about and react to daily life events and activities.  Stress managementtechniques. These include yoga, meditation, and exercise and can be very helpful when they are practiced regularly. A mental health specialist can help determine which treatment is best for you. Some people see improvement with one therapy. However, other people require a combination of therapies. This information is not intended to replace advice given to you by your health care provider. Make sure you discuss any questions you have with your health care provider. Document Released: 08/02/2012 Document Revised: 04/28/2014 Document Reviewed: 08/02/2012 Elsevier Interactive Patient Education  2017 [place]. Fluoxetine capsules or tablets (Depression/Mood Disorders) What is this medicine? FLUOXETINE (floo OX e teen) belongs to a class of drugs known as selective serotonin reuptake inhibitors (SSRIs). It helps to treat mood problems such as depression, obsessive compulsive disorder, and panic attacks. It can also treat certain eating disorders. This medicine may be used for other purposes; ask your health care provider or pharmacist if you have questions. COMMON BRAND NAME(S): Prozac What should I tell my health care  provider before I take this medicine? They need to know if you have any of these conditions: -bipolar disorder or a family history of bipolar disorder -bleeding disorders -glaucoma -heart disease -liver disease -low levels of sodium in the blood -seizures -suicidal thoughts, plans, or attempt; a previous suicide attempt by you or a family member -take MAOIs like Carbex, Eldepryl, Marplan, Nardil, and Parnate -take medicines that treat or prevent blood clots -thyroid disease -an unusual or allergic reaction to fluoxetine, other medicines, foods, dyes, or preservatives -pregnant or trying to get pregnant -breast-feeding How should I use this medicine? Take this medicine by mouth with a glass of water. Follow the directions on the prescription label. You can take this medicine with or without food. Take your medicine at regular intervals. Do not take it more often than directed. Do not stop taking this medicine suddenly except upon the advice of your doctor. Stopping this medicine too quickly may cause serious side effects or your condition may worsen. A special MedGuide will be given to you by the pharmacist with each prescription and refill. Be sure to read this information carefully each time. Talk to your pediatrician regarding the use of this medicine in children. While this drug may be prescribed for children as young as 7 years for selected conditions, precautions do apply. Overdosage: If you think you have taken too much of this medicine contact a poison control center or emergency room at once. NOTE: This medicine is only for you. Do not share this medicine with others. What if I miss a dose? If you miss a dose, skip the missed dose and go back to your regular dosing schedule. Do not take double or extra doses. What may interact with this medicine? Do not take this medicine with any of the following medications: -other medicines containing fluoxetine, like Sarafem or  Symbyax -cisapride -linezolid -MAOIs like Carbex, Eldepryl, Marplan, Nardil, and Parnate -methylene blue (injected into a vein) -pimozide -thioridazine This medicine may also interact with the following medications: -alcohol -amphetamines -aspirin and aspirin-like medicines -carbamazepine -certain medicines for depression, anxiety, or psychotic disturbances -certain  medicines for migraine headaches like almotriptan, eletriptan, frovatriptan, naratriptan, rizatriptan, sumatriptan, zolmitriptan -digoxin -diuretics -fentanyl -flecainide -furazolidone -isoniazid -lithium -medicines for sleep -medicines that treat or prevent blood clots like warfarin, enoxaparin, and dalteparin -NSAIDs, medicines for pain and inflammation, like ibuprofen or naproxen -phenytoin -procarbazine -propafenone -rasagiline -ritonavir -supplements like St. John's wort, kava kava, valerian -tramadol -tryptophan -vinblastine This list may not describe all possible interactions. Give your health care provider a list of all the medicines, herbs, non-prescription drugs, or dietary supplements you use. Also tell them if you smoke, drink alcohol, or use illegal drugs. Some items may interact with your medicine. What should I watch for while using this medicine? Tell your doctor if your symptoms do not get better or if they get worse. Visit your doctor or health care professional for regular checks on your progress. Because it may take several weeks to see the full effects of this medicine, it is important to continue your treatment as prescribed by your doctor. Patients and their families should watch out for new or worsening thoughts of suicide or depression. Also watch out for sudden changes in feelings such as feeling anxious, agitated, panicky, irritable, hostile, aggressive, impulsive, severely restless, overly excited and hyperactive, or not being able to sleep. If this happens, especially at the beginning of  treatment or after a change in dose, call your health care professional. Dennis Bast may get drowsy or dizzy. Do not drive, use machinery, or do anything that needs mental alertness until you know how this medicine affects you. Do not stand or sit up quickly, especially if you are an older patient. This reduces the risk of dizzy or fainting spells. Alcohol may interfere with the effect of this medicine. Avoid alcoholic drinks. Your mouth may get dry. Chewing sugarless gum or sucking hard candy, and drinking plenty of water may help. Contact your doctor if the problem does not go away or is severe. This medicine may affect blood sugar levels. If you have diabetes, check with your doctor or health care professional before you change your diet or the dose of your diabetic medicine. What side effects may I notice from receiving this medicine? Side effects that you should report to your doctor or health care professional as soon as possible: -allergic reactions like skin rash, itching or hives, swelling of the face, lips, or tongue -anxious -black, tarry stools -breathing problems -changes in vision -confusion -elevated mood, decreased need for sleep, racing thoughts, impulsive behavior -eye pain -fast, irregular heartbeat -feeling faint or lightheaded, falls -feeling agitated, angry, or irritable -hallucination, loss of contact with reality -loss of balance or coordination -loss of memory -painful or prolonged erections -restlessness, pacing, inability to keep still -seizures -stiff muscles -suicidal thoughts or other mood changes -trouble sleeping -unusual bleeding or bruising -unusually weak or tired -vomiting Side effects that usually do not require medical attention (report to your doctor or health care professional if they continue or are bothersome): -change in appetite or weight -change in sex drive or performance -diarrhea -dry mouth -headache -increased  sweating -nausea -tremors This list may not describe all possible side effects. Call your doctor for medical advice about side effects. You may report side effects to FDA at 1-800-FDA-1088. Where should I keep my medicine? Keep out of the reach of children. Store at room temperature between 15 and 30 degrees C (59 and 86 degrees F). Throw away any unused medicine after the expiration date. NOTE: This sheet is a summary. It may not cover all possible  information. If you have questions about this medicine, talk to your doctor, pharmacist, or health care provider.  2017 Elsevier/Gold Standard (2015-09-08 15:55:27)

## 2016-05-29 NOTE — Assessment & Plan Note (Signed)
Not under good control. On no preventative medicine. Will start her on prozac and recheck in 2 weeks. Continue to follow with counselor. Having significant panic attacks. Will treat with ativan rather than xanax. Discussed with patient that she cannot take more than 2 a day and only when she really needs it. Continue to monitor closely.

## 2016-05-29 NOTE — Telephone Encounter (Signed)
Patient said she had no transportation but could call 911. Explained to patient that Dr. Laural BenesJohnson suggested she be seen.   Patient said she'd call 911 and get everything checked out because it was "freaking" her out.

## 2016-05-29 NOTE — Assessment & Plan Note (Signed)
Not under good control. Dr. Juliann Paresallwood recommended spironalactone. Will start that and recheck in 2 weeks.

## 2016-05-29 NOTE — Telephone Encounter (Signed)
Routing to provider  

## 2016-05-29 NOTE — Assessment & Plan Note (Signed)
Rechecking levels today. Await results.  

## 2016-05-30 ENCOUNTER — Other Ambulatory Visit: Payer: Self-pay | Admitting: Family Medicine

## 2016-05-30 ENCOUNTER — Encounter: Payer: Self-pay | Admitting: Family Medicine

## 2016-05-30 DIAGNOSIS — Z113 Encounter for screening for infections with a predominantly sexual mode of transmission: Secondary | ICD-10-CM

## 2016-05-30 DIAGNOSIS — R7989 Other specified abnormal findings of blood chemistry: Secondary | ICD-10-CM

## 2016-05-30 DIAGNOSIS — I10 Essential (primary) hypertension: Secondary | ICD-10-CM

## 2016-05-30 LAB — COMPREHENSIVE METABOLIC PANEL
ALT: 7 IU/L (ref 0–32)
AST: 15 IU/L (ref 0–40)
Albumin/Globulin Ratio: 1.2 (ref 1.2–2.2)
Albumin: 3.8 g/dL (ref 3.5–5.5)
Alkaline Phosphatase: 103 IU/L (ref 39–117)
BUN/Creatinine Ratio: 16 (ref 9–23)
BUN: 13 mg/dL (ref 6–24)
Bilirubin Total: 0.4 mg/dL (ref 0.0–1.2)
CO2: 19 mmol/L (ref 18–29)
Calcium: 8.3 mg/dL — ABNORMAL LOW (ref 8.7–10.2)
Chloride: 104 mmol/L (ref 96–106)
Creatinine, Ser: 0.79 mg/dL (ref 0.57–1.00)
GFR calc Af Amer: 98 mL/min/{1.73_m2} (ref >59)
GFR calc non Af Amer: 85 mL/min/{1.73_m2} (ref >59)
Globulin, Total: 3.1 g/dL (ref 1.5–4.5)
Glucose: 77 mg/dL (ref 65–99)
Potassium: 3.7 mmol/L (ref 3.5–5.2)
Sodium: 139 mmol/L (ref 134–144)
Total Protein: 6.9 g/dL (ref 6.0–8.5)

## 2016-05-30 LAB — CBC WITH DIFFERENTIAL/PLATELET
Basophils Absolute: 0.1 10*3/uL (ref 0.0–0.2)
Basos: 1 %
EOS (ABSOLUTE): 0.4 10*3/uL (ref 0.0–0.4)
Eos: 4 %
Hematocrit: 38 % (ref 34.0–46.6)
Hemoglobin: 12.8 g/dL (ref 11.1–15.9)
Immature Grans (Abs): 0 10*3/uL (ref 0.0–0.1)
Immature Granulocytes: 0 %
Lymphocytes Absolute: 2.6 10*3/uL (ref 0.7–3.1)
Lymphs: 30 %
MCH: 29.5 pg (ref 26.6–33.0)
MCHC: 33.7 g/dL (ref 31.5–35.7)
MCV: 88 fL (ref 79–97)
Monocytes Absolute: 0.8 10*3/uL (ref 0.1–0.9)
Monocytes: 9 %
Neutrophils Absolute: 4.9 10*3/uL (ref 1.4–7.0)
Neutrophils: 56 %
Platelets: 200 10*3/uL (ref 150–379)
RBC: 4.34 x10E6/uL (ref 3.77–5.28)
RDW: 13.9 % (ref 12.3–15.4)
WBC: 8.7 10*3/uL (ref 3.4–10.8)

## 2016-05-30 LAB — TSH: TSH: 6.9 u[IU]/mL — ABNORMAL HIGH (ref 0.450–4.500)

## 2016-05-30 LAB — LIPID PANEL W/O CHOL/HDL RATIO
Cholesterol, Total: 105 mg/dL (ref 100–199)
HDL: 39 mg/dL — ABNORMAL LOW (ref >39)
LDL Calculated: 53 mg/dL (ref 0–99)
Triglycerides: 65 mg/dL (ref 0–149)
VLDL Cholesterol Cal: 13 mg/dL (ref 5–40)

## 2016-05-30 NOTE — Addendum Note (Signed)
Addended by: Dorcas CarrowJOHNSON, MEGAN P on: 05/30/2016 08:14 AM   Modules accepted: Level of Service

## 2016-05-30 NOTE — Telephone Encounter (Signed)
Patient called to see if Dr La[facility]Johnson [facility] some [facility] the scripts Dr Shela CommonsJ prescribed for patient at her visit yesterday.    Please advise.  Thanks   [phone]  Gigi GinPeggy

## 2016-06-02 ENCOUNTER — Telehealth: Payer: Self-pay | Admitting: Family Medicine

## 2016-06-02 LAB — MICROALBUMIN, URINE WAIVED
Creatinine, Urine Waived: 100 mg/dL (ref 10–300)
Microalb, Ur Waived: 150 mg/L — ABNORMAL HIGH (ref 0–19)
Microalb/Creat Ratio: >300 mg/g — ABNORMAL HIGH (ref <30)

## 2016-06-02 LAB — URINE CULTURE, REFLEX

## 2016-06-02 LAB — UA/M W/RFLX CULTURE, ROUTINE
Bilirubin, UA: NEGATIVE
Glucose, UA: NEGATIVE
Ketones, UA: NEGATIVE
Nitrite, UA: POSITIVE — AB
Specific Gravity, UA: 1.01 (ref 1.005–1.030)
Urobilinogen, Ur: 0.2 mg/dL (ref 0.2–1.0)
pH, UA: 7 (ref 5.0–7.5)

## 2016-06-02 LAB — MICROSCOPIC EXAMINATION: RBC, UA: NONE SEEN /hpf (ref 0–?)

## 2016-06-02 LAB — BAYER DCA HB A1C WAIVED: HB A1C (BAYER DCA - WAIVED): 5 % (ref <7.0)

## 2016-06-02 MED ORDER — FLUOXETINE HCL 20 MG PO CAPS: 20.0000 mg | ORAL_CAPSULE | Freq: Every day | ORAL | 1 refills | Status: DC

## 2016-06-02 NOTE — Telephone Encounter (Signed)
Called and let patient know about medications. I explained the difference between the $4 and $10 prices for her medications. Patient asked if Lorazepam was covered and how much it would be. I told patient that it was not listed on Medicaid's preferred drug list and that we do not know how much it is. I asked for patient to take it to the pharmacy and see how much it would be. I also asked for patient to let us know if it is too expensive so we can try to see if there is something we could do for her about that. Patient verbalized understanding.

## 2016-06-02 NOTE — Telephone Encounter (Signed)
Pt called and stated that FLUoxetine (PROZAC) 20 MG tablet was $265 and spironolactone (ALDACTONE) 25 MG tablet was $10 and she would like to know if there are any medications similar to these on the four dollar list because they weren't covered by medicaid. She would also like to know if LORazepam (ATIVAN) 0.5 MG tablet is covered by medicaid. Pharmacy is [facility] [address]

## 2016-06-02 NOTE — Telephone Encounter (Signed)
New fluoxetine sent to her pharmacy

## 2016-06-02 NOTE — Telephone Encounter (Signed)
Routing to provider. Fluoxetine is on $4 list but as capsules. For the spironolactone, the 30 day supply is $4 and the 90 day supply is $10. Patient was prescribed 90 day supply for the spironolactone. Lorazepam was not found on the Medicaid preferred drug list.

## 2016-06-04 ENCOUNTER — Telehealth: Payer: Self-pay | Admitting: Family Medicine

## 2016-06-04 NOTE — Telephone Encounter (Signed)
[facility] called to get clarification on a script that Dr Laural BenesJohnson Ativan (lorazepam).   Please call and [phone] to assist.  Thanks

## 2016-06-04 NOTE — Telephone Encounter (Signed)
Called pharmacy and was on hold for 8 minutes. Will try to call again tomorrow.

## 2016-06-05 LAB — IGP, APTIMA HPV, RFX 16/18,45
HPV Aptima: NEGATIVE
PAP Smear Comment: 0

## 2016-06-05 NOTE — Telephone Encounter (Signed)
Called and spoke with pharmacist, Judeth CornfieldStephanie. She questioned about patient's lorazepam because she stated that patient was just given Xanax 0.5 mg, one tablet BID as needed, #60 by Dr. Cassie FreerParachos on 05/25/16. Judeth CornfieldStephanie wants to make sure that this patient is supposed to take these together or what she needs to do with prescription. I asked Judeth CornfieldStephanie to just hold the prescription for now until I speak with Dr. Laural BenesJohnson about what's going on and what to do.

## 2016-06-05 NOTE — Telephone Encounter (Signed)
Called and left patient a VM asking for her to please return my call.  

## 2016-06-05 NOTE — Telephone Encounter (Signed)
Please check with patient to see how many of the xanax she has at home. She can either bring the Rx up here and we can cancel the Rx from Dr. Cassie FreerParachos and she can have the Rx from me, or she can finish that Rx and not get the Rx from me until it's done, but she can't have both. Thanks!

## 2016-06-06 NOTE — Telephone Encounter (Signed)
Called and left patient a VM asking for her to please return my call to discuss medications.

## 2016-06-06 NOTE — Telephone Encounter (Signed)
Called and spoke with patient. She stated that she is still currently taking Xanax. I explained to the patient that per Dr. Laural BenesJohnson, she cannot take Xanax and Lorazepam at the same time. I told the patient that she has to stop Xanax before she can start her Lorazepam or either bring us the rest of her Xanax to dispose of so she can start the Lorazepam. Patient stated she has an appointment with Dr. Laural BenesJohnson next Friday and we decided to keep everything the same until that appointment. Patient stated she would bring in her medications to her appointment.

## 2016-06-10 ENCOUNTER — Ambulatory Visit
Admission: RE | Admit: 2016-06-10 | Discharge: 2016-06-10 | Disposition: A | Discharge status: Home / Self Care | Payer: Medicaid Other | Source: Ambulatory Visit | Attending: Family Medicine | Admitting: Family Medicine

## 2016-06-10 DIAGNOSIS — N644 Mastodynia: Secondary | ICD-10-CM

## 2016-06-13 ENCOUNTER — Encounter: Payer: Self-pay | Admitting: Family Medicine

## 2016-06-13 ENCOUNTER — Ambulatory Visit (INDEPENDENT_AMBULATORY_CARE_PROVIDER_SITE_OTHER): Payer: Medicaid Other | Admitting: Family Medicine

## 2016-06-13 ENCOUNTER — Ambulatory Visit: Payer: Medicaid Other | Admitting: Family Medicine

## 2016-06-13 VITALS — BP 140/80 | HR 59 | Temp 98.4°F | Wt 153.0 lb

## 2016-06-13 DIAGNOSIS — F331 Major depressive disorder, recurrent, moderate: Secondary | ICD-10-CM | POA: Diagnosis not present

## 2016-06-13 DIAGNOSIS — Z113 Encounter for screening for infections with a predominantly sexual mode of transmission: Secondary | ICD-10-CM

## 2016-06-13 DIAGNOSIS — I129 Hypertensive chronic kidney disease with stage 1 through stage 4 chronic kidney disease, or unspecified chronic kidney disease: Secondary | ICD-10-CM

## 2016-06-13 DIAGNOSIS — R7989 Other specified abnormal findings of blood chemistry: Secondary | ICD-10-CM

## 2016-06-13 DIAGNOSIS — N8110 Cystocele, unspecified: Secondary | ICD-10-CM

## 2016-06-13 DIAGNOSIS — R946 Abnormal results of thyroid function studies: Secondary | ICD-10-CM

## 2016-06-13 MED ORDER — SPIRONOLACTONE 25 MG PO TABS: 25.0000 mg | ORAL_TABLET | Freq: Every day | ORAL | 1 refills | Status: DC

## 2016-06-13 NOTE — Progress Notes (Signed)
BP 140/80   Pulse (!) 59   Temp 98.4 F (36.9 C)   Wt 153 lb (69.4 kg)   LMP 06/09/2016   SpO2 99%   BMI 24.69 kg/m    Subjective:    Patient ID: Natalie Taylor, female    DOB: 07/02/1960, 56 y.o.   MRN: [id number]  HPI: Natalie Taylor is a 56 y.o. female  Chief Complaint  Patient presents with  . Hypertension    She states she has not started the Spirolactone yet, that the pharmacy did not have it. Needs it resent.  . Depression    She also brought in her bottle of Alprazolam to switch out so she can get the rx for Lorazepam  . Gas    She states she's very gassy, takes the max amount of gas-x she can  . Referral    She'd like to get a referral to see about having her bladder tacked up.  . Lab Work    Natalie Kiewit SonsShe'd like to have full STD testing (future orders are in for it)   Patient presents for 2 week follow-up for her BP. Was to have started on spironolactone, but states the pharmacy did not have it in stock so she has not yet started on the medication.   Also was told if she brought in her remaining Xanax she could get her script changed to ativan. Has brought the remaining pills in with her today for destruction. They have not been working for her panic episodes.   Having persistent issues with incontinence since childbirth x 4 and would like a referral to see about bladder surgery.   Also spoke with Natalie Taylor about getting screening STD labs and wants to get them drawn today while she is here.   Past Medical History:  Diagnosis Date  . Arthritis    hands  . COPD (chronic obstructive pulmonary disease) (HCC)   . Coronary artery disease   . GERD (gastroesophageal reflux disease)   . Headache    daily  . Hyperlipidemia   . Hypertension   . MI (myocardial infarction)    x 2  . Shortness of breath dyspnea    pt attributes to meds  . Sleep apnea    Social History   Social History  . Marital status: Divorced    Spouse name: N/A  . Number of children: N/A  .  Years of education: N/A   Occupational History  . Not on file.   Social History Main Topics  . Smoking status: Current Every Day Smoker    Packs/day: 0.50    Years: 40.00    Types: Cigarettes  . Smokeless tobacco: Never Used  . Alcohol use No  . Drug use: No  . Sexual activity: Yes   Other Topics Concern  . Not on file   Social History Narrative  . No narrative on file    Relevant past medical, surgical, family and social history reviewed and updated as indicated. Interim medical history since our last visit reviewed. Allergies and medications reviewed and updated.  Review of Systems  Constitutional: Negative.   HENT: Negative.   Eyes: Negative.   Respiratory: Negative.   Cardiovascular: Negative.   Gastrointestinal: Negative.   Genitourinary:       Incontinence  Musculoskeletal: Negative.   Neurological: Negative.   Psychiatric/Behavioral: Negative.     Per HPI unless specifically indicated above     Objective:    BP 140/80   Pulse (!) 59  Temp 98.4 F (36.9 C)   Wt 153 lb (69.4 kg)   LMP 06/09/2016   SpO2 99%   BMI 24.69 kg/m   Wt Readings from Last 3 Encounters:  06/13/16 153 lb (69.4 kg)  05/29/16 151 lb 9.6 oz (68.8 kg)  03/22/16 140 lb (63.5 kg)    Physical Exam  Constitutional: She is oriented to person, place, and time. She appears well-developed and well-nourished.  HENT:  Head: Atraumatic.  Eyes: Conjunctivae are normal. Pupils are equal, round, and reactive to light.  Neck: Normal range of motion. Neck supple.  Cardiovascular: Normal rate and normal heart sounds.   Pulmonary/Chest: Effort normal and breath sounds normal. No respiratory distress.  Musculoskeletal: Normal range of motion.  Neurological: She is alert and oriented to person, place, and time.  Skin: Skin is warm and dry.  Psychiatric: She has a normal mood and affect. Her behavior is normal.  Nursing note and vitals reviewed.     Assessment & Plan:   Problem List Items  Addressed This Visit      Genitourinary   Benign hypertensive renal disease    Sent spironolactone to a different pharmacy, discussed to come in for  recheck 2 weeks after starting the medicine for BMP and BP check.         Other   Depression    Discussed to take remaining pills to sheriff's office for destruction. Ativan script given today. Discussed risks and precautions with the medication.        Other Visit Diagnoses    Female bladder prolapse    -  Primary   Referral placed today   Relevant Orders   Ambulatory referral to Urology   Routine screening for STI (sexually transmitted infection)       Await results   Abnormal thyroid blood test       Await results       Follow up plan: Return in about 2 weeks (around 06/27/2016).

## 2016-06-13 NOTE — Patient Instructions (Signed)
[address] [place], [place]

## 2016-06-14 LAB — HEPATITIS PANEL, ACUTE
Hep A IgM: NEGATIVE
Hep B C IgM: NEGATIVE
Hep C Virus Ab: <0.1 s/co ratio (ref 0.0–0.9)
Hepatitis B Surface Ag: NEGATIVE

## 2016-06-14 LAB — HSV(HERPES SIMPLEX VRS) I + II AB-IGG
HSV 1 Glycoprotein G Ab, IgG: 14 index — ABNORMAL HIGH (ref 0.00–0.90)
HSV 2 Glycoprotein G Ab, IgG: 2.93 index — ABNORMAL HIGH (ref 0.00–0.90)

## 2016-06-14 LAB — THYROID PANEL WITH TSH
Free Thyroxine Index: 2 (ref 1.2–4.9)
T3 Uptake Ratio: 25 % (ref 24–39)
T4, Total: 7.9 ug/dL (ref 4.5–12.0)
TSH: 4.52 u[IU]/mL — ABNORMAL HIGH (ref 0.450–4.500)

## 2016-06-14 LAB — RPR: RPR Ser Ql: NONREACTIVE

## 2016-06-14 LAB — HIV ANTIBODY (ROUTINE TESTING W REFLEX): HIV Screen 4th Generation wRfx: NONREACTIVE

## 2016-06-16 ENCOUNTER — Telehealth: Payer: Self-pay | Admitting: Family Medicine

## 2016-06-16 LAB — GC/CHLAMYDIA PROBE AMP
Chlamydia trachomatis, NAA: NEGATIVE
Neisseria gonorrhoeae by PCR: NEGATIVE

## 2016-06-16 MED ORDER — LORAZEPAM 0.5 MG PO TABS
0.5000 mg | ORAL_TABLET | Freq: Two times a day (BID) | ORAL | 0 refills | Status: DC | PRN
Start: 2016-06-16 — End: 2016-07-14

## 2016-06-16 NOTE — Telephone Encounter (Signed)
Please let her know that her STI screen came back negative except exposure to herpes- which she had been told about previously. Everything else looked good. Her thyroid is still off a little bit, but it's better, and we tested it early, so we'll recheck it when she comes back. Thanks!

## 2016-06-16 NOTE — Assessment & Plan Note (Addendum)
Sent spironolactone to a different pharmacy, discussed to come in for  recheck 2 weeks after starting the medicine for BMP and BP check.

## 2016-06-16 NOTE — Telephone Encounter (Signed)
Called and discussed results with patient. She didn't know what genital herpes was. Information mailed out to patient.

## 2016-06-16 NOTE — Telephone Encounter (Signed)
Verified with patient that she did dispose of the Xanax and then called [facility] pharmacy and released Rx for the Ativan

## 2016-06-16 NOTE — Telephone Encounter (Signed)
[facility] on [address] wouldn't let patient fill her ativan.

## 2016-06-16 NOTE — Assessment & Plan Note (Signed)
Discussed to take remaining pills to sheriff's office for destruction. Ativan script given today. Discussed risks and precautions with the medication.

## 2016-06-27 ENCOUNTER — Encounter: Payer: Self-pay | Admitting: Family Medicine

## 2016-06-27 ENCOUNTER — Ambulatory Visit (INDEPENDENT_AMBULATORY_CARE_PROVIDER_SITE_OTHER): Payer: Medicaid Other | Admitting: Family Medicine

## 2016-06-27 VITALS — BP 104/72 | HR 56 | Temp 98.2°F | Wt 151.0 lb

## 2016-06-27 DIAGNOSIS — I129 Hypertensive chronic kidney disease with stage 1 through stage 4 chronic kidney disease, or unspecified chronic kidney disease: Secondary | ICD-10-CM | POA: Diagnosis not present

## 2016-06-27 DIAGNOSIS — M62830 Muscle spasm of back: Secondary | ICD-10-CM | POA: Diagnosis not present

## 2016-06-27 MED ORDER — CYCLOBENZAPRINE HCL 10 MG PO TABS: 10.0000 mg | ORAL_TABLET | Freq: Three times a day (TID) | ORAL | 0 refills | Status: DC | PRN

## 2016-06-27 NOTE — Progress Notes (Signed)
BP 104/72   Pulse (!) 56   Temp 98.2 F (36.8 C)   Wt 151 lb (68.5 kg)   LMP 06/09/2016   SpO2 97%   BMI 24.37 kg/m    Subjective:    Patient ID: Natalie Taylor, female    DOB: [birth year], 56 y.o.   MRN: [id number]  HPI: Natalie Taylor is a 56 y.o. female  Chief Complaint  Patient presents with  . Hypertension    follow up. She started the Spironolactone. Doing ok with it.   Patient presents for HTN follow up 2 weeks post adding spironolactone per Cardiology recommendation. BPs have been doing well, possibly slightly on the low side. States she has been feeling well, no side effects reported. Taking medication faithfully. Still taking lisinopril and metoprolol as well.   Also c/o intermittent back spasms, which she has had issues with in the past as well. Has had good response to prn flexeril previously. Trying tylenol with minimal relief.   Past Medical History:  Diagnosis Date  . Arthritis    hands  . COPD (chronic obstructive pulmonary disease) (HCC)   . Coronary artery disease   . GERD (gastroesophageal reflux disease)   . Headache    daily  . Hyperlipidemia   . Hypertension   . MI (myocardial infarction)    x 2  . Shortness of breath dyspnea    pt attributes to meds  . Sleep apnea    Social History   Social History  . Marital status: Divorced    Spouse name: N/A  . Number of children: N/A  . Years of education: N/A   Occupational History  . Not on file.   Social History Main Topics  . Smoking status: Current Every Day Smoker    Packs/day: 0.50    Years: 40.00    Types: Cigarettes  . Smokeless tobacco: Never Used  . Alcohol use No  . Drug use: No  . Sexual activity: Yes   Other Topics Concern  . Not on file   Social History Narrative  . No narrative on file    Relevant past medical, surgical, family and social history reviewed and updated as indicated. Interim medical history since our last visit reviewed. Allergies and medications  reviewed and updated.  Review of Systems  Constitutional: Negative.   HENT: Negative.   Respiratory: Negative.   Cardiovascular: Negative.   Gastrointestinal: Negative.   Genitourinary: Negative.   Musculoskeletal: Positive for back pain.  Neurological: Negative.   Psychiatric/Behavioral: Negative.     Per HPI unless specifically indicated above     Objective:    BP 104/72   Pulse (!) 56   Temp 98.2 F (36.8 C)   Wt 151 lb (68.5 kg)   LMP 06/09/2016   SpO2 97%   BMI 24.37 kg/m   Wt Readings from Last 3 Encounters:  06/30/16 150 lb 14.4 oz (68.4 kg)  06/27/16 151 lb (68.5 kg)  06/13/16 153 lb (69.4 kg)    Physical Exam  Constitutional: She is oriented to person, place, and time. She appears well-developed and well-nourished. No distress.  HENT:  Head: Atraumatic.  Eyes: Conjunctivae are normal. Pupils are equal, round, and reactive to light.  Neck: Normal range of motion. Neck supple.  Cardiovascular: Normal rate and normal heart sounds.   Pulmonary/Chest: Effort normal and breath sounds normal. No respiratory distress.  Musculoskeletal: Normal range of motion. She exhibits tenderness (mild ttp in thoracic and lumbar paraspinal muscles b/l).  Neurological: She is alert and oriented to person, place, and time. No cranial nerve deficit.  Skin: Skin is warm and dry.  Psychiatric: She has a normal mood and affect. Her behavior is normal.      Assessment & Plan:   Problem List Items Addressed This Visit      Genitourinary   Benign hypertensive renal disease - Primary    Await CMP results. Possibly overtreating her HTN at this time, will monitor closely for orthostatic sxs. Patient will schedule a follow up in the next couple of weeks      Relevant Orders   Basic Metabolic Panel (BMET) (Completed)    Other Visit Diagnoses    Muscle spasm of back       Flexeril sent for prn use. Discussed massage, stretches, heating pad, epsom salt soaks. Precautions reviewed with  this medication.        Follow up plan: Return in about 4 weeks (around 07/25/2016) for F/u from physical with Dr. Laural BenesJohnson.

## 2016-06-28 LAB — BASIC METABOLIC PANEL
BUN/Creatinine Ratio: 18 (ref 9–23)
BUN: 17 mg/dL (ref 6–24)
CO2: 27 mmol/L (ref 18–29)
Calcium: 9.6 mg/dL (ref 8.7–10.2)
Chloride: 97 mmol/L (ref 96–106)
Creatinine, Ser: 0.93 mg/dL (ref 0.57–1.00)
GFR calc Af Amer: 81 mL/min/{1.73_m2} (ref >59)
GFR calc non Af Amer: 70 mL/min/{1.73_m2} (ref >59)
Glucose: 80 mg/dL (ref 65–99)
Potassium: 5.3 mmol/L — ABNORMAL HIGH (ref 3.5–5.2)
Sodium: 135 mmol/L (ref 134–144)

## 2016-06-30 ENCOUNTER — Ambulatory Visit (INDEPENDENT_AMBULATORY_CARE_PROVIDER_SITE_OTHER): Payer: Medicaid Other | Admitting: Urology

## 2016-06-30 ENCOUNTER — Telehealth: Payer: Self-pay | Admitting: Family Medicine

## 2016-06-30 VITALS — Ht 66.0 in | Wt 150.9 lb

## 2016-06-30 DIAGNOSIS — N39498 Other specified urinary incontinence: Secondary | ICD-10-CM | POA: Diagnosis not present

## 2016-06-30 DIAGNOSIS — N3946 Mixed incontinence: Secondary | ICD-10-CM

## 2016-06-30 DIAGNOSIS — N8110 Cystocele, unspecified: Secondary | ICD-10-CM | POA: Diagnosis not present

## 2016-06-30 LAB — URINALYSIS, COMPLETE
Bilirubin, UA: NEGATIVE
Glucose, UA: NEGATIVE
Ketones, UA: NEGATIVE
Nitrite, UA: POSITIVE — AB
Protein, UA: NEGATIVE
Specific Gravity, UA: 1.02 (ref 1.005–1.030)
Urobilinogen, Ur: 0.2 mg/dL (ref 0.2–1.0)
pH, UA: 6 (ref 5.0–7.5)

## 2016-06-30 LAB — MICROSCOPIC EXAMINATION: Epithelial Cells (non renal): >10 /hpf — AB (ref 0–10)

## 2016-06-30 LAB — BLADDER SCAN AMB NON-IMAGING: Scan Result: 3

## 2016-06-30 MED ORDER — HYDROCHLOROTHIAZIDE 25 MG PO TABS: 25.0000 mg | ORAL_TABLET | Freq: Every day | ORAL | 0 refills | Status: DC

## 2016-06-30 NOTE — Progress Notes (Signed)
06/30/2016 10:35 AM   Natalie BunnellPeggy C Taylor 09/13/1960 [id number]  Referring provider: Dorcas CarrowMegan P Johnson, DO [address] [place], [place] [id number]  Chief Complaint  Patient presents with  . New Patient (Initial Visit)    possible bladder prolapse     HPI: The patient has mixed incontinence worsening over a number years. She leaks with coughing sneezing bending and lifting and running. She also reports urgency incontinence. She does report bedwetting of moderate severity. She normally does not wear pads.  She voids 3 times a day. She voids once or twice at night.  Her flow sometimes is good and sometimes is a trickle. Sometimes stops and starts. Sometimes she does not feel empty. She does not strain to urinate  She reports one urinary tract infection per year with typical cystitis symptoms of foul-smelling urine that respond favorably antibiotics  She denies history kidney stones or previous GU surgery. She has no neurologic issues. She is prone to constipation. The presentation has not been medically treated  Modifying factors: There are no other modifying factors  Associated signs and symptoms: There are no other associated signs and symptoms Aggravating and relieving factors: There are no other aggravating or relieving factors Severity: Moderate Duration: Persistent    PMH: Past Medical History:  Diagnosis Date  . Arthritis    hands  . COPD (chronic obstructive pulmonary disease) (HCC)   . Coronary artery disease   . GERD (gastroesophageal reflux disease)   . Headache    daily  . Hyperlipidemia   . Hypertension   . MI (myocardial infarction)    x 2  . Shortness of breath dyspnea    pt attributes to meds  . Sleep apnea     Surgical History: Past Surgical History:  Procedure Laterality Date  . CARDIAC CATHETERIZATION N/A 09/27/2015   Procedure: Left Heart Cath and Coronary Angiography;  Surgeon: Alwyn Peawayne D Callwood, MD;  Location: [facility];  Service:  Cardiovascular;  Laterality: N/A;  . CARDIAC CATHETERIZATION N/A 09/27/2015   Procedure: Coronary Stent Intervention;  Surgeon: Alwyn Peawayne D Callwood, MD;  Location: [facility];  Service: Cardiovascular;  Laterality: N/A;  . Cardiac stents  09/27/2015  . CHOLECYSTECTOMY    . COLONOSCOPY WITH PROPOFOL N/A 11/19/2015   Procedure: COLONOSCOPY WITH PROPOFOL;  Surgeon: Midge Miniumarren Wohl, MD;  [facility];  Service: Endoscopy;  Laterality: N/A;  . INDUCED ABORTION    . MULTIPLE TOOTH EXTRACTIONS    . TUBAL LIGATION      Home Medications:  Allergies as of 06/30/2016      Reactions   Cranberry Hives   Cranberry juice   Tape Rash      Medication List       Accurate as of 06/30/16 10:35 AM. Always use your most recent med list.          albuterol 108 (90 Base) MCG/ACT inhaler Commonly known as:  PROVENTIL HFA;VENTOLIN HFA Inhale 2 puffs into the lungs 2 (two) times daily.   atorvastatin 80 MG tablet Commonly known as:  LIPITOR Take 1 tablet (80 mg total) by mouth daily at 6 PM.   cyclobenzaprine 10 MG tablet Commonly known as:  FLEXERIL Take 1 tablet (10 mg total) by mouth 3 (three) times daily as needed for muscle spasms.   FLUoxetine 20 MG capsule Commonly known as:  PROZAC Take 1 capsule (20 mg total) by mouth daily.   hydrochlorothiazide 25 MG tablet Commonly known as:  HYDRODIURIL Take 1  tablet (25 mg total) by mouth daily.   lisinopril 10 MG tablet Commonly known as:  PRINIVIL,ZESTRIL Take 1 tablet (10 mg total) by mouth daily.   LORazepam 0.5 MG tablet Commonly known as:  ATIVAN Take 1 tablet (0.5 mg total) by mouth 2 (two) times daily as needed for anxiety.   metoprolol 50 MG tablet Commonly known as:  LOPRESSOR Take 0.5 tablets (25 mg total) by mouth 2 (two) times daily.   omeprazole 20 MG capsule Commonly known as:  PRILOSEC Take 20 mg by mouth daily.   spironolactone 25 MG tablet Commonly known as:  ALDACTONE TK 1 T PO D   ticagrelor  90 MG Tabs tablet Commonly known as:  BRILINTA Take 1 tablet (90 mg total) by mouth 2 (two) times daily.   traZODone 50 MG tablet Commonly known as:  DESYREL Take 1-2 tablets (50-100 mg total) by mouth at bedtime as needed for sleep.       Allergies:  Allergies  Allergen Reactions  . Cranberry Hives    Cranberry juice  . Tape Rash    Family History: Family History  Problem Relation Age of Onset  . Alcohol abuse Mother   . Arthritis Mother   . Asthma Mother   . Cancer Mother   . Hypertension Mother   . Migraines Mother   . Hyperlipidemia Sister   . Hyperlipidemia Brother   . Heart disease Maternal Grandmother   . Emphysema Maternal Grandfather   . Breast cancer Paternal Aunt 30    double mastectomy    Social History:  reports that she has been smoking Cigarettes.  She has a 20.00 pack-year smoking history. She has never used smokeless tobacco. She reports that she does not drink alcohol or use drugs.  ROS: UROLOGY Frequent Urination?: Yes Hard to postpone urination?: No Burning/pain with urination?: No Get up at night to urinate?: Yes Leakage of urine?: Yes Urine stream starts and stops?: Yes Trouble starting stream?: No Do you have to strain to urinate?: No Blood in urine?: No Urinary tract infection?: No Sexually transmitted disease?: Yes Injury to kidneys or bladder?: No Painful intercourse?: No Weak stream?: Yes Currently pregnant?: No Vaginal bleeding?: No Last menstrual period?: n  Gastrointestinal Nausea?: Yes Vomiting?: No Indigestion/heartburn?: Yes Diarrhea?: Yes Constipation?: Yes  Constitutional Fever: Yes Night sweats?: No Weight loss?: No Fatigue?: Yes  Skin Skin rash/lesions?: No Itching?: Yes  Eyes Blurred vision?: Yes Double vision?: No  Ears/Nose/Throat Sore throat?: No Sinus problems?: Yes  Hematologic/Lymphatic Swollen glands?: Yes Easy bruising?: Yes  Cardiovascular Leg swelling?: No Chest pain?:  Yes  Respiratory Cough?: Yes Shortness of breath?: Yes  Endocrine Excessive thirst?: Yes  Musculoskeletal Back pain?: Yes Joint pain?: Yes  Neurological Headaches?: Yes Dizziness?: Yes  Psychologic Depression?: Yes Anxiety?: Yes  Physical Exam: Ht 5\' 6"  (1.676 m)   Wt 68.4 kg (150 lb 14.4 oz)   LMP 06/09/2016   BMI 24.36 kg/m   Constitutional:  Alert and oriented, No acute distress. HEENT: [place] AT, moist mucus membranes.  Trachea midline, no masses. Cardiovascular: No clubbing, cyanosis, or edema. Respiratory: Normal respiratory effort, no increased work of breathing. GI: Abdomen is soft, nontender, nondistended, no abdominal masses GU: No CVA tenderness. The patient had grade 2 hypermobility of the bladder neck and a modest positive cough test. She had a grade 1 cystocele and grade 1 rectocele. She did have some rotational descent of the anterior vaginal wall but her apex was well supported Skin: No rashes, bruises or  suspicious lesions. Lymph: No cervical or inguinal adenopathy. Neurologic: Grossly intact, no focal deficits, moving all 4 extremities. Psychiatric: Normal mood and affect.  Laboratory Data: Lab Results  Component Value Date   WBC 8.7 05/29/2016   HGB 15.0 03/22/2016   HCT 38.0 05/29/2016   MCV 88 05/29/2016   PLT 200 05/29/2016    Lab Results  Component Value Date   CREATININE 0.93 06/27/2016    No results found for: PSA  No results found for: TESTOSTERONE  Lab Results  Component Value Date   HGBA1C  09/22/2009    5.3 (NOTE)                                                                       According to the ADA Clinical Practice Recommendations for 2011, when HbA1c is used as a screening test:   >=6.5%   Diagnostic of Diabetes Mellitus           (if abnormal result  is confirmed)  5.7-6.4%   Increased risk of developing Diabetes Mellitus  References:Diagnosis and Classification of Diabetes Mellitus,Diabetes Care,2011,34(Suppl 1):S62-S69  and Standards of Medical Care in         Diabetes - 2011,Diabetes Care,2011,34  (Suppl 1):S11-S61.    Urinalysis    Component Value Date/Time   COLORURINE YELLOW (A) 10/09/2015 2303   APPEARANCEUR Cloudy (A) 05/29/2016 1038   LABSPEC 1.009 10/09/2015 2303   PHURINE 7.0 10/09/2015 2303   GLUCOSEU Negative 05/29/2016 1038   HGBUR 1+ (A) 10/09/2015 2303   BILIRUBINUR Negative 05/29/2016 1038   KETONESUR NEGATIVE 10/09/2015 2303   PROTEINUR 2+ (A) 05/29/2016 1038   PROTEINUR 30 (A) 10/09/2015 2303   UROBILINOGEN 1.0 09/21/2009 0534   NITRITE Positive (A) 05/29/2016 1038   NITRITE NEGATIVE 10/09/2015 2303   LEUKOCYTESUR 2+ (A) 05/29/2016 1038    Pertinent Imaging: none  Assessment & Plan:  The patient has mixed incontinence and moderate bedwetting. She has mild nocturia. She does have some flow symptoms and voids infrequently throughout the day. She reports the urge and stress components both significant. Pathophysiology of urinary incontinence and the role of urodynamics was discussed. Prolapse is not symptomatic  1. Female bladder prolapse  - Bladder Scan (Post Void Residual) in office  2. Other urinary incontinence  - Urinalysis, Complete   No Follow-up on file.  Martina Sinner, MD  [facility] [address] [place], [place] [address] [phone]

## 2016-06-30 NOTE — Telephone Encounter (Signed)
Patient notified of results and medication change. [facility] but she'd [facility] use [facility], so I called the rx in there.

## 2016-06-30 NOTE — Telephone Encounter (Signed)
Please call pt and let her know that her potassium is too high with this new medicine so we need to switch her to a different one. I will send in HCTZ for her and she should stop taking the spironolactone. I think she should already have an appt soon for regular f/u but make sure one way or another she comes back in the next few weeks to a month for recheck. Thanks

## 2016-07-03 LAB — CULTURE, URINE COMPREHENSIVE

## 2016-07-03 NOTE — Assessment & Plan Note (Signed)
Await CMP results. Possibly overtreating her HTN at this time, will monitor closely for orthostatic sxs. Patient will schedule a follow up in the next couple of weeks

## 2016-07-07 ENCOUNTER — Telehealth: Payer: Self-pay

## 2016-07-07 DIAGNOSIS — N390 Urinary tract infection, site not specified: Secondary | ICD-10-CM

## 2016-07-07 MED ORDER — NITROFURANTOIN MACROCRYSTAL 100 MG PO CAPS: 100.0000 mg | ORAL_CAPSULE | Freq: Four times a day (QID) | ORAL | 0 refills | Status: DC

## 2016-07-07 NOTE — Telephone Encounter (Signed)
Spoke with pt in reference to +ucx and abx. Made aware abx were sent to pharmacy. Pt voiced understanding.  

## 2016-07-07 NOTE — Telephone Encounter (Signed)
Alfredo MartinezScott MacDiarmid, MD  Skeet Latchhelsea C Watkins, LPN        Macrodantin 100 mg bid for 7 days    [facility]

## 2016-07-14 ENCOUNTER — Other Ambulatory Visit: Payer: Self-pay | Admitting: Family Medicine

## 2016-07-14 ENCOUNTER — Telehealth: Payer: Self-pay | Admitting: Family Medicine

## 2016-07-14 MED ORDER — LORAZEPAM 0.5 MG PO TABS: 0.5000 mg | ORAL_TABLET | Freq: Two times a day (BID) | ORAL | 0 refills | Status: DC | PRN

## 2016-07-14 NOTE — Telephone Encounter (Signed)
Tried calling in prescription but pharmacy tech stated that the pharmacist was there by herself and asked for me to call back and leave the information on VM. I did that and let them know that Dr. Laural BenesJohnson said to not fill the medication until tomorrow. Will call patient and let her know.

## 2016-07-14 NOTE — Telephone Encounter (Signed)
Patient is needing her Lorazepam 0.5mg  [facility] [address]   Thank you

## 2016-07-14 NOTE — Telephone Encounter (Signed)
Needs a refill on her ativan. OK to call in- she should not be due until tomorrow. OK to fill tomorrow. Thanks!

## 2016-07-14 NOTE — Telephone Encounter (Signed)
Request for Lorazepam.  Last fill date 06/16/2016.  Upcoming appt 07/29/2016

## 2016-07-14 NOTE — Telephone Encounter (Signed)
Called and let patient know about prescription being called in and that it would not be available until tomorrow for pick up.

## 2016-07-14 NOTE — Telephone Encounter (Signed)
Meant to reorder Lorazepam, not Lisinopril.  Lorazepam approved by Dr. Laural BenesJohnson.

## 2016-07-19 DIAGNOSIS — H52223 Regular astigmatism, bilateral: Secondary | ICD-10-CM | POA: Diagnosis not present

## 2016-07-19 DIAGNOSIS — H43812 Vitreous degeneration, left eye: Secondary | ICD-10-CM | POA: Diagnosis not present

## 2016-07-24 ENCOUNTER — Telehealth: Payer: Self-pay | Admitting: Family Medicine

## 2016-07-24 NOTE — Telephone Encounter (Signed)
She gets that from her cardiologist- not from me

## 2016-07-24 NOTE — Telephone Encounter (Signed)
Patient needs refills called in for her Brinlinta    [facility] [address]  Thanks

## 2016-07-24 NOTE — Telephone Encounter (Signed)
Left message on voice mail for patient to call cardiologist for refill of this medication.

## 2016-07-29 ENCOUNTER — Ambulatory Visit (INDEPENDENT_AMBULATORY_CARE_PROVIDER_SITE_OTHER): Payer: Medicaid Other | Admitting: Family Medicine

## 2016-07-29 ENCOUNTER — Encounter: Payer: Self-pay | Admitting: Family Medicine

## 2016-07-29 VITALS — BP 138/85 | HR 54 | Temp 97.8°F | Resp 17 | Ht 66.0 in | Wt 150.0 lb

## 2016-07-29 DIAGNOSIS — R7989 Other specified abnormal findings of blood chemistry: Secondary | ICD-10-CM

## 2016-07-29 DIAGNOSIS — I129 Hypertensive chronic kidney disease with stage 1 through stage 4 chronic kidney disease, or unspecified chronic kidney disease: Secondary | ICD-10-CM | POA: Diagnosis not present

## 2016-07-29 DIAGNOSIS — R946 Abnormal results of thyroid function studies: Secondary | ICD-10-CM

## 2016-07-29 DIAGNOSIS — F331 Major depressive disorder, recurrent, moderate: Secondary | ICD-10-CM | POA: Diagnosis not present

## 2016-07-29 MED ORDER — FLUOXETINE HCL 40 MG PO CAPS: 40.0000 mg | ORAL_CAPSULE | Freq: Every day | ORAL | 1 refills | Status: DC

## 2016-07-29 NOTE — Assessment & Plan Note (Signed)
Under good control. Continue current regimen. Rechecking BMP today. Await results.

## 2016-07-29 NOTE — Progress Notes (Signed)
BP 138/85 (BP Location: Right Arm, Patient Position: Sitting, Cuff Size: Normal)   Pulse (!) 54   Temp 97.8 F (36.6 C) (Oral)   Resp 17   Ht  (1.676 m)   Wt 150 lb (68 kg)   LMP 06/18/2016 (Approximate)   SpO2 100%   BMI 24.21 kg/m    Subjective:    Patient ID: Natalie Taylor, female    DOB: 1960-06-27, 56 y.o.   MRN: [id number]  HPI: Natalie Taylor is a 56 y.o. female  Chief Complaint  Patient presents with  . Hypertension  . Anxiety    Starts crying uncontrollably 3 times since last time was here.   HYPERTENSION Hypertension status: controlled  Satisfied with current treatment? yes Duration of hypertension: chronic BP monitoring frequency:  not checking BP medication side effects:  no Medication compliance: good compliance Previous BP meds: lisinopril, spironalactone, HCTZ Aspirin: no Recurrent headaches: no Visual changes: no Palpitations: no Dyspnea: no Chest pain: no Lower extremity edema: no Dizzy/lightheaded: no  ANXIETY/STRESS- has been under a lot of stress. She notes that yesterday she did something by herself but then when she got home, she had a panic attack and felt really bad, she didn't take an ativan because she has been taking it  Duration:better Anxious mood: yes  Excessive worrying: yes Irritability: yes  Sweating: yes Nausea: yes Palpitations:yes Hyperventilation: yes Panic attacks: yes Agoraphobia: yes  Obscessions/compulsions: no Depressed mood: yes Depression screen [facility] 2/9 06/13/2016 05/29/2016 11/15/2015 11/01/2015  Decreased Interest 1 0 2 3  Down, Depressed, Hopeless PHQ - 2 Score Altered sleeping Tired, decreased energy Change in appetite Feeling bad or failure about yourself  Trouble concentrating Moving slowly or fidgety/restless 1 2 0 2  Suicidal thoughts 1 3 0 1  PHQ-9 Score Difficult doing work/chores - - - Extremely dIfficult    Anhedonia: no Weight changes: no Insomnia: yes hard to fall asleep  Hypersomnia: no Fatigue/loss of energy: yes Feelings of worthlessness: yes Feelings of guilt: yes Impaired concentration/indecisiveness: yes Suicidal ideations: no  Crying spells: yes Recent Stressors/Life Changes: no  Relevant past medical, surgical, family and social history reviewed and updated as indicated. Interim medical history since our last visit reviewed. Allergies and medications reviewed and updated.  Review of Systems  Constitutional: Negative.   Respiratory: Negative.   Cardiovascular: Negative.   Psychiatric/Behavioral: Positive for dysphoric mood and sleep disturbance. Negative for agitation, behavioral problems, confusion, decreased concentration, hallucinations, self-injury and suicidal ideas. The patient is nervous/anxious. The patient is not hyperactive.     Per HPI unless specifically indicated above     Objective:    BP 138/85 (BP Location: Right Arm, Patient Position: Sitting, Cuff Size: Normal)   Pulse (!) 54   Temp 97.8 F (36.6 C) (Oral)   Resp 17   Ht  (1.676 m)   Wt 150 lb (68 kg)   LMP 06/18/2016 (Approximate)   SpO2 100%   BMI 24.21 kg/m   Wt Readings from Last 3 Encounters:  07/29/16 150 lb (68 kg)  06/30/16 150 lb 14.4 oz (68.4 kg)  06/27/16 151 lb (68.5 kg)    Physical Exam  Constitutional: She is oriented to person, place, and time. She appears well-developed and well-nourished. No distress.  HENT:  Head: Normocephalic and atraumatic.  Right Ear: Hearing normal.  Left Ear: Hearing normal.  Nose: Nose normal.  Eyes: Conjunctivae and lids are normal. Right eye exhibits no discharge. Left eye exhibits no discharge. No scleral icterus.  Cardiovascular: Normal rate, regular rhythm, normal heart sounds and intact distal pulses.  Exam reveals no gallop and no friction rub.   No murmur heard. Pulmonary/Chest: Effort normal and breath sounds normal. No respiratory  distress. She has no wheezes. She has no rales. She exhibits no tenderness.  Musculoskeletal: Normal range of motion.  Neurological: She is alert and oriented to person, place, and time.  Skin: Skin is warm, dry and intact. No rash noted. No erythema. No pallor.  Psychiatric: Her speech is normal and behavior is normal. Judgment and thought content normal. Her mood appears anxious. Cognition and memory are normal. She exhibits a depressed mood.  Tearful at times  Nursing note and vitals reviewed.   Results for orders placed or performed in visit on 06/30/16  CULTURE, URINE COMPREHENSIVE  Result Value Ref Range   Urine Culture, Comprehensive Final report (A)    Result 1 Staphylococcus aureus (A)    ANTIMICROBIAL SUSCEPTIBILITY Comment   Microscopic Examination  Result Value Ref Range   WBC, UA 11-30 (A) 0 - 5 /hpf   RBC, UA 0-2 0 - 2 /hpf   Epithelial Cells (non renal) >10 (A) 0 - 10 /hpf   Bacteria, UA Few None seen/Few  Urinalysis, Complete  Result Value Ref Range   Specific Gravity, UA 1.020 1.005 - 1.030   pH, UA 6.0 5.0 - 7.5   Color, UA Yellow Yellow   Appearance Ur Cloudy (A) Clear   Leukocytes, UA Trace (A) Negative   Protein, UA Negative Negative/Trace   Glucose, UA Negative Negative   Ketones, UA Negative Negative   RBC, UA Trace (A) Negative   Bilirubin, UA Negative Negative   Urobilinogen, Ur 0.2 0.2 - 1.0 mg/dL   Nitrite, UA Positive (A) Negative   Microscopic Examination See below:   Bladder Scan (Post Void Residual) in office  Result Value Ref Range   Scan Result 3       Assessment & Plan:   Problem List Items Addressed This Visit      Genitourinary   Benign hypertensive renal disease - Primary    Under good control. Continue current regimen. Rechecking BMP today. Await results.       Relevant Orders   Basic metabolic panel     Other   Depression    Doing better, but still not under good control. Will increase her prozac to  and continue PRN  ativan. Call with any concerns. Recheck 1 month.       Relevant Medications   FLUoxetine (PROZAC) 40 MG capsule    Other Visit Diagnoses    Abnormal thyroid blood test       Rechecking labs today. Await results.    Relevant Orders   Thyroid Panel With TSH       Follow up plan: Return in about 4 weeks (around 08/26/2016) for Follow up mood.

## 2016-07-29 NOTE — Assessment & Plan Note (Signed)
Doing better, but still not under good control. Will increase her prozac to  and continue PRN ativan. Call with any concerns. Recheck 1 month.

## 2016-07-30 ENCOUNTER — Encounter: Payer: Self-pay | Admitting: Family Medicine

## 2016-07-30 LAB — BASIC METABOLIC PANEL
BUN/Creatinine Ratio: 20 (ref 9–23)
BUN: 21 mg/dL (ref 6–24)
CO2: 21 mmol/L (ref 18–29)
Calcium: 9.3 mg/dL (ref 8.7–10.2)
Chloride: 97 mmol/L (ref 96–106)
Creatinine, Ser: 1.03 mg/dL — ABNORMAL HIGH (ref 0.57–1.00)
GFR calc Af Amer: 71 mL/min/{1.73_m2} (ref >59)
GFR calc non Af Amer: 62 mL/min/{1.73_m2} (ref >59)
Glucose: 90 mg/dL (ref 65–99)
Potassium: 4.4 mmol/L (ref 3.5–5.2)
Sodium: 137 mmol/L (ref 134–144)

## 2016-07-30 LAB — THYROID PANEL WITH TSH
Free Thyroxine Index: 2.1 (ref 1.2–4.9)
T3 Uptake Ratio: 25 % (ref 24–39)
T4, Total: 8.4 ug/dL (ref 4.5–12.0)
TSH: 3.4 u[IU]/mL (ref 0.450–4.500)

## 2016-08-04 ENCOUNTER — Ambulatory Visit: Payer: Medicaid Other

## 2016-08-22 ENCOUNTER — Telehealth: Payer: Self-pay | Admitting: Family Medicine

## 2016-08-22 ENCOUNTER — Other Ambulatory Visit: Payer: Self-pay | Admitting: Urology

## 2016-08-22 ENCOUNTER — Other Ambulatory Visit: Payer: Self-pay | Admitting: Family Medicine

## 2016-08-22 MED ORDER — LORAZEPAM 0.5 MG PO TABS
0.5000 mg | ORAL_TABLET | Freq: Two times a day (BID) | ORAL | 0 refills | Status: DC | PRN
Start: 2016-08-22 — End: 2016-09-24

## 2016-08-22 NOTE — Telephone Encounter (Signed)
OK to call in for her. Thanks! 

## 2016-08-22 NOTE — Telephone Encounter (Signed)
Patient called in regards to refilling her prescription for: Lorazepam (Ativan). Please Advise.  Patient contact number: [phone]  Pharmacy contact:  [phone] [place], [place]

## 2016-08-22 NOTE — Telephone Encounter (Signed)
[facility] [address]

## 2016-09-01 ENCOUNTER — Ambulatory Visit: Payer: Medicaid Other | Admitting: Urology

## 2016-09-01 ENCOUNTER — Encounter: Payer: Self-pay | Admitting: Urology

## 2016-09-01 VITALS — BP 113/72 | HR 61 | Ht 66.0 in | Wt 150.0 lb

## 2016-09-01 DIAGNOSIS — N3946 Mixed incontinence: Secondary | ICD-10-CM | POA: Diagnosis not present

## 2016-09-01 MED ORDER — MIRABEGRON ER 50 MG PO TB24: 50.0000 mg | ORAL_TABLET | Freq: Every day | ORAL | 11 refills | Status: DC

## 2016-09-01 NOTE — Progress Notes (Signed)
09/01/2016 9:51 AM   Natalie Taylor [birth year] [id number]  Referring provider: Dorcas Carrow, DO [address] [place], [place] [address]  Chief Complaint  Patient presents with  . Urinary Incontinence    HPI: The patient has mixed incontinence worsening over a number years. She leaks with coughing sneezing bending and lifting and running. She also reports urgency incontinence. She does report bedwetting of moderate severity. She normally does not wear pads.  She voids 3 times a day. She voids once or twice at night.  Her flow sometimes is good and sometimes is a trickle. Sometimes stops and starts. Sometimes she does not feel empty. She does not strain to urinate  The patient had grade 2 hypermobility of the bladder neck and a modest positive cough test. She had a grade 1 cystocele and grade 1 rectocele. She did have some rotational descent of the anterior vaginal wall but her apex was well supported  The patient has mixed incontinence and moderate bedwetting. She has mild nocturia. She does have some flow symptoms and voids infrequently throughout the day. She reports the urge and stress components both significant.   Today Frequency and incontinence are stable. The maximum bladder capacity was 162 mL. The bladder was unstable reaching a pressure 26 cm water. She had voided off the contraction. Steward Drone was not able to assess her stress incontinence because she was triggering. During voluntary voiding she voided 104 mL with a maximum flow of mils per second. Maximum voiding pressure of 15 cm water. She empty officially. EMG activity increased during the voiding phase. Bladder neck descended 2 cm. Bladder was mildly trabeculated. Bladder was hypersensitive. There was question of a small urethral diverticulum. When I reviewed the x-rays the findings were somewhat nonspecific as noted  PMH: Past Medical History:  Diagnosis Date  . Arthritis    hands  . COPD (chronic obstructive  pulmonary disease) (HCC)   . Coronary artery disease   . GERD (gastroesophageal reflux disease)   . Headache    daily  . Hyperlipidemia   . Hypertension   . MI (myocardial infarction) (HCC)    x 2  . Shortness of breath dyspnea    pt attributes to meds  . Sleep apnea     Surgical History: Past Surgical History:  Procedure Laterality Date  . CARDIAC CATHETERIZATION N/A 09/27/2015   Procedure: Left Heart Cath and Coronary Angiography;  Surgeon: Alwyn Pea, MD;  Location: [facility] INVASIVE CV LAB;  Service: Cardiovascular;  Laterality: N/A;  . CARDIAC CATHETERIZATION N/A 09/27/2015   Procedure: Coronary Stent Intervention;  Surgeon: Alwyn Pea, MD;  Location: [facility] INVASIVE CV LAB;  Service: Cardiovascular;  Laterality: N/A;  . Cardiac stents  09/27/2015  . CHOLECYSTECTOMY    . COLONOSCOPY WITH PROPOFOL N/A 11/19/2015   Procedure: COLONOSCOPY WITH PROPOFOL;  Surgeon: Midge Minium, MD;  Location: [facility];  Service: Endoscopy;  Laterality: N/A;  . INDUCED ABORTION    . MULTIPLE TOOTH EXTRACTIONS    . TUBAL LIGATION      Home Medications:  Allergies as of 09/01/2016      Reactions   Cranberry Hives   Cranberry juice   Tape Rash      Medication List       Accurate as of 09/01/16  9:51 AM. Always use your most recent med list.          albuterol 108 (90 Base) MCG/ACT inhaler Commonly known as:  PROVENTIL HFA;VENTOLIN HFA Inhale 2  puffs into the lungs 2 (two) times daily.   atorvastatin 80 MG tablet Commonly known as:  LIPITOR Take 1 tablet (80 mg total) by mouth daily at 6 PM.   cyclobenzaprine 10 MG tablet Commonly known as:  FLEXERIL Take 1 tablet (10 mg total) by mouth 3 (three) times daily as needed for muscle spasms.   FLUoxetine 40 MG capsule Commonly known as:  PROZAC Take 1 capsule (40 mg total) by mouth daily.   hydrochlorothiazide 25 MG tablet Commonly known as:  HYDRODIURIL Take 1 tablet (25 mg total) by mouth daily.   lisinopril 10 MG  tablet Commonly known as:  PRINIVIL,ZESTRIL Take 1 tablet (10 mg total) by mouth daily.   LORazepam 0.5 MG tablet Commonly known as:  ATIVAN Take 1 tablet (0.5 mg total) by mouth 2 (two) times daily as needed for anxiety.   metoprolol 50 MG tablet Commonly known as:  LOPRESSOR Take 0.5 tablets (25 mg total) by mouth 2 (two) times daily.   omeprazole 20 MG capsule Commonly known as:  PRILOSEC Take 20 mg by mouth daily.   spironolactone 25 MG tablet Commonly known as:  ALDACTONE TK 1 T PO D   ticagrelor 90 MG Tabs tablet Commonly known as:  BRILINTA Take 1 tablet (90 mg total) by mouth 2 (two) times daily.   traZODone 50 MG tablet Commonly known as:  DESYREL Take 1-2 tablets (50-100 mg total) by mouth at bedtime as needed for sleep.       Allergies:  Allergies  Allergen Reactions  . Cranberry Hives    Cranberry juice  . Tape Rash    Family History: Family History  Problem Relation Age of Onset  . Alcohol abuse Mother   . Arthritis Mother   . Asthma Mother   . Cancer Mother   . Hypertension Mother   . Migraines Mother   . Hyperlipidemia Sister   . Hyperlipidemia Brother   . Heart disease Maternal Grandmother   . Emphysema Maternal Grandfather   . Breast cancer Paternal Aunt 30       double mastectomy    Social History:  reports that she has been smoking Cigarettes.  She has a 20.00 pack-year smoking history. She has never used smokeless tobacco. She reports that she does not drink alcohol or use drugs.  ROS:                                        Physical Exam: BP 113/72 (BP Location: Left Arm, Patient Position: Sitting, Cuff Size: Normal)   Pulse 61   Ht 5\' 6"  (1.676 m)   Wt 68 kg (150 lb)   BMI 24.21 kg/m   Constitutional:  Alert and oriented, No acute distress.  Laboratory Data: Lab Results  Component Value Date   WBC 8.7 05/29/2016   HGB 15.0 03/22/2016   HCT 38.0 05/29/2016   MCV 88 05/29/2016   PLT 200 05/29/2016      Lab Results  Component Value Date   CREATININE 1.03 (H) 07/29/2016    No results found for: PSA  No results found for: TESTOSTERONE  Lab Results  Component Value Date   HGBA1C  09/22/2009    5.3 (NOTE)  According to the ADA Clinical Practice Recommendations for 2011, when HbA1c is used as a screening test:   >=6.5%   Diagnostic of Diabetes Mellitus           (if abnormal result  is confirmed)  5.7-6.4%   Increased risk of developing Diabetes Mellitus  References:Diagnosis and Classification of Diabetes Mellitus,Diabetes Care,2011,34(Suppl 1):S62-S69 and Standards of Medical Care in         Diabetes - 2011,Diabetes Care,2011,34  (Suppl 1):S11-S61.    Urinalysis    Component Value Date/Time   COLORURINE YELLOW (A) 10/09/2015 2303   APPEARANCEUR Cloudy (A) 06/30/2016 1015   LABSPEC 1.009 10/09/2015 2303   PHURINE 7.0 10/09/2015 2303   GLUCOSEU Negative 06/30/2016 1015   HGBUR 1+ (A) 10/09/2015 2303   BILIRUBINUR Negative 06/30/2016 1015   KETONESUR NEGATIVE 10/09/2015 2303   PROTEINUR Negative 06/30/2016 1015   PROTEINUR 30 (A) 10/09/2015 2303   UROBILINOGEN 1.0 09/21/2009 0534   NITRITE Positive (A) 06/30/2016 1015   NITRITE NEGATIVE 10/09/2015 2303   LEUKOCYTESUR Trace (A) 06/30/2016 1015    Pertinent Imaging: none  Assessment & Plan:  The patient has mixed incontinence and bedwetting. She has an impressive overactive bladder. If she did not reach her treatment goal with medical and behavioral therapy one would need to re-quantitate the stress component since I refractory therapy may be more prudent that a sling. If she ever had a sling I would order an MRI prior to rule out an occult urethral diverticulum  The patient understands the findings and notation about an MRI. I will see her back in 4-6 weeks on the beta 3 agonist 50 mg  There are no diagnoses linked to this encounter.  No Follow-up on  file.  Martina SinnerMACDIARMID,Rogene Meth A, MD  [facility] [address], [address] [place], [place] [address] [phone]

## 2016-09-02 ENCOUNTER — Ambulatory Visit (INDEPENDENT_AMBULATORY_CARE_PROVIDER_SITE_OTHER): Payer: Medicaid Other | Admitting: Family Medicine

## 2016-09-02 ENCOUNTER — Encounter: Payer: Self-pay | Admitting: Family Medicine

## 2016-09-02 ENCOUNTER — Telehealth: Payer: Self-pay | Admitting: Family Medicine

## 2016-09-02 DIAGNOSIS — F331 Major depressive disorder, recurrent, moderate: Secondary | ICD-10-CM

## 2016-09-02 MED ORDER — TRAZODONE HCL 50 MG PO TABS: 50.0000 mg | ORAL_TABLET | Freq: Every evening | ORAL | 3 refills | Status: DC | PRN

## 2016-09-02 MED ORDER — BUPROPION HCL ER (SR) 150 MG PO TB12: ORAL_TABLET | ORAL | 3 refills | Status: DC

## 2016-09-02 NOTE — Progress Notes (Signed)
BP 108/72 (BP Location: Left Arm, Patient Position: Sitting, Cuff Size: Normal)   Pulse (!) 50   Temp 97.8 F (36.6 C)   Wt 148 lb 12.8 oz (67.5 kg)   SpO2 99%   BMI 24.02 kg/m    Subjective:    Patient ID: Natalie Taylor, female    DOB: 05/29/1960, 56 y.o.   MRN: [id number]  HPI: Natalie Bunnelleggy C Natalie Taylor is a 56 y.o. female  Chief Complaint  Patient presents with  . Depression  . Insomnia    Patient needs a refill on her Trazodone   DEPRESSION- very concerned about her disability. Lawyers tell her it should be OK, but she is concerned that she is going to be homeless. Not doing well.  Mood status: exacerbated Satisfied with current treatment?: no Symptom severity: severe  Duration of current treatment : chronic Side effects: no Medication compliance: excellent compliance Psychotherapy/counseling: no  Previous psychiatric medications: prozac Depressed mood: yes Anxious mood: yes Anhedonia: yes Significant weight loss or gain: no Insomnia: yes hard to fall asleep Fatigue: yes Feelings of worthlessness or guilt: yes Impaired concentration/indecisiveness: yes Suicidal ideations: no Hopelessness: yes Crying spells: yes [facility] 2[facility]9 09/02/2016 06/13/2016 05/29/2016 11/15/2015 11/01/2015  Decreased Interest 2 1 0 2 3  Down, Depressed, Hopeless 3 3 3 1 3   PHQ - 2 Score 5 4 3 3 6   Altered sleeping 3 3 3 1 3   Tired, decreased energy 3 1 1 1 3   Change in appetite 2 1 3 1 3   Feeling bad or failure about yourself  3 3 2 1 3   Trouble concentrating 2 3 2 1 3   Moving slowly or fidgety/restless 2 1 2  0 2  Suicidal thoughts 0 1 3 0 1  PHQ-9 Score 20 17 19 8 24   Difficult doing work/chores - - - - Extremely dIfficult    Relevant past medical, surgical, family and social history reviewed and updated as indicated. Interim medical history since our last visit reviewed. Allergies and medications reviewed and updated.  Review of Systems  Constitutional: Negative.   Respiratory:  Negative.   Cardiovascular: Negative.   Psychiatric/Behavioral: Positive for decreased concentration, dysphoric mood and sleep disturbance. Negative for agitation, behavioral problems, confusion, hallucinations, self-injury and suicidal ideas. The patient is nervous/anxious. The patient is not hyperactive.     Per HPI unless specifically indicated above     Objective:    BP 108/72 (BP Location: Left Arm, Patient Position: Sitting, Cuff Size: Normal)   Pulse (!) 50   Temp 97.8 F (36.6 C)   Wt 148 lb 12.8 oz (67.5 kg)   SpO2 99%   BMI 24.02 kg/m   Wt Readings from Last 3 Encounters:  09/02/16 148 lb 12.8 oz (67.5 kg)  09/01/16 150 lb (68 kg)  07/29/16 150 lb (68 kg)    Physical Exam  Constitutional: She is oriented to person, place, and time. She appears well-developed and well-nourished. No distress.  HENT:  Head: Normocephalic and atraumatic.  Right Ear: Hearing normal.  Left Ear: Hearing normal.  Nose: Nose normal.  Eyes: Conjunctivae and lids are normal. Right eye exhibits no discharge. Left eye exhibits no discharge. No scleral icterus.  Cardiovascular: Normal rate, regular rhythm, normal heart sounds and intact distal pulses.  Exam reveals no gallop and no friction rub.   No murmur heard. Pulmonary/Chest: Effort normal and breath sounds normal. No respiratory distress. She has no wheezes. She has no rales. She exhibits no tenderness.  Musculoskeletal:  Normal range of motion.  Neurological: She is alert and oriented to person, place, and time.  Skin: Skin is warm, dry and intact. No rash noted. No erythema. No pallor.  Psychiatric: Judgment and thought content normal. Her mood appears anxious. Her affect is blunt. She is withdrawn. Cognition and memory are normal. She exhibits a depressed mood. She is noncommunicative.  Nursing note and vitals reviewed.   Results for orders placed or performed in visit on 07/29/16  Thyroid Panel With TSH  Result Value Ref Range   TSH  3.400 0.450 - 4.500 uIU/mL   T4, Total 8.4 4.5 - 12.0 ug/dL   T3 Uptake Ratio 25 24 - 39 %   Free Thyroxine Index 2.1 1.2 - 4.9  Basic metabolic panel  Result Value Ref Range   Glucose 90 65 - 99 mg/dL   BUN 21 6 - 24 mg/dL   Creatinine, Ser 1.61 (H) 0.57 - 1.00 mg/dL   GFR calc non Af Amer 62 >59 mL/min/1.73   GFR calc Af Amer 71 >59 mL/min/1.73   BUN/Creatinine Ratio 20 9 - 23   Sodium 137 134 - 144 mmol/L   Potassium 4.4 3.5 - 5.2 mmol/L   Chloride 97 96 - 106 mmol/L   CO2 21 18 - 29 mmol/L   Calcium 9.3 8.7 - 10.2 mg/dL      Assessment & Plan:   Problem List Items Addressed This Visit      Other   Depression    Not under good control. Will add wellbutrin and recheck in 3-4 weeks. Continue other medications unchanged. Call with any concerns.       Relevant Medications   buPROPion (WELLBUTRIN SR) 150 MG 12 hr tablet   traZODone (DESYREL) 50 MG tablet       Follow up plan: Return 3-4 weeks, for follow up mood.

## 2016-09-02 NOTE — Telephone Encounter (Signed)
[facility] [place] [facility] [address]

## 2016-09-02 NOTE — Assessment & Plan Note (Signed)
Not under good control. Will add wellbutrin and recheck in 3-4 weeks. Continue other medications unchanged. Call with any concerns.

## 2016-09-03 NOTE — Telephone Encounter (Signed)
[facility] [place]. They stated that [facility] and [facility]. Called in the trazodone prescription. When I spoke with patient earlier, I let her know that [facility] would be [facility] [place] so patient is aware that they should be there.

## 2016-09-03 NOTE — Telephone Encounter (Signed)
Called and left patient a VM asking for her to please return my call. If [facility] meant [facility] [address], will call to confirm that they have the bupropion and call in the trazodone. If patient wants [facility] [place], will call and [facility]ropion [facility] [address] and then [facility] [place].

## 2016-09-03 NOTE — Telephone Encounter (Signed)
Pt would like to have buPROPion (WELLBUTRIN SR) 150 MG 12 hr tablet and traZODone (DESYREL) 50 MG tablet sent [facility] [place] [place] [facility] [address].

## 2016-09-03 NOTE — Telephone Encounter (Signed)
[facility] and [facility] them into [facility] [place] at patient's request.

## 2016-09-03 NOTE — Telephone Encounter (Signed)
Called [facility] to [facility]. [facility] [address] yesterday and trazodone was still on hold with them so I cancelled the [facility]one [facility] with [facility]. Since the [facility]ion was [facility] to [facility] [address] and not [facility] [place], I will call the patient can confirm with her that she wants these prescriptions [facility] [place] [facility] [address]

## 2016-09-03 NOTE — Telephone Encounter (Signed)
Tried calling patient again and spoke with her this time. I explained to patient what was going on and patient stated that the [facility] to [facility] [place]. Will call [facility] [address] and cancel bupropion.

## 2016-09-03 NOTE — Telephone Encounter (Signed)
Called [facility] [address] to cancel bupropion and they state that it was [facility] [place]. Will call them and see what is going on.

## 2016-09-04 ENCOUNTER — Telehealth: Payer: Self-pay | Admitting: Urology

## 2016-09-04 NOTE — Telephone Encounter (Signed)
LMOM

## 2016-09-04 NOTE — Telephone Encounter (Signed)
Patient called the office and left a voice mail message requesting a return call.  She was seen in the office on 5/14 by Dr. Sherron MondayMacDiarmid.  He gave her samples of Myrbetriq.  She needs to clarify the instructions on how to take the medication.

## 2016-09-04 NOTE — Telephone Encounter (Signed)
Spoke with pt in reference to how to take myrbetriq samples. Pt voiced understanding.

## 2016-09-24 ENCOUNTER — Encounter: Payer: Self-pay | Admitting: Family Medicine

## 2016-09-24 ENCOUNTER — Telehealth: Payer: Self-pay | Admitting: Family Medicine

## 2016-09-24 ENCOUNTER — Ambulatory Visit (INDEPENDENT_AMBULATORY_CARE_PROVIDER_SITE_OTHER): Payer: Medicaid Other | Admitting: Family Medicine

## 2016-09-24 VITALS — BP 108/69 | HR 61 | Temp 98.0°F | Wt 148.6 lb

## 2016-09-24 DIAGNOSIS — I129 Hypertensive chronic kidney disease with stage 1 through stage 4 chronic kidney disease, or unspecified chronic kidney disease: Secondary | ICD-10-CM | POA: Diagnosis not present

## 2016-09-24 DIAGNOSIS — N3281 Overactive bladder: Secondary | ICD-10-CM | POA: Insufficient documentation

## 2016-09-24 DIAGNOSIS — F332 Major depressive disorder, recurrent severe without psychotic features: Secondary | ICD-10-CM

## 2016-09-24 DIAGNOSIS — M5432 Sciatica, left side: Secondary | ICD-10-CM

## 2016-09-24 DIAGNOSIS — J449 Chronic obstructive pulmonary disease, unspecified: Secondary | ICD-10-CM | POA: Insufficient documentation

## 2016-09-24 DIAGNOSIS — I252 Old myocardial infarction: Secondary | ICD-10-CM

## 2016-09-24 MED ORDER — TRAZODONE HCL 50 MG PO TABS: 50.0000 mg | ORAL_TABLET | Freq: Every evening | ORAL | 1 refills | Status: DC | PRN

## 2016-09-24 MED ORDER — CYCLOBENZAPRINE HCL 10 MG PO TABS
10.0000 mg | ORAL_TABLET | Freq: Three times a day (TID) | ORAL | 1 refills | Status: DC | PRN
Start: 2016-09-24 — End: 2017-04-24

## 2016-09-24 MED ORDER — METOPROLOL TARTRATE 50 MG PO TABS: 25.0000 mg | ORAL_TABLET | Freq: Two times a day (BID) | ORAL | 1 refills | Status: DC

## 2016-09-24 MED ORDER — HYDROCHLOROTHIAZIDE 25 MG PO TABS: 25.0000 mg | ORAL_TABLET | Freq: Every day | ORAL | 1 refills | Status: DC

## 2016-09-24 MED ORDER — BUPROPION HCL ER (SR) 150 MG PO TB12: ORAL_TABLET | ORAL | 1 refills | Status: DC

## 2016-09-24 MED ORDER — MIRABEGRON ER 50 MG PO TB24: 50.0000 mg | ORAL_TABLET | Freq: Every day | ORAL | 1 refills | Status: DC

## 2016-09-24 MED ORDER — ATORVASTATIN CALCIUM 80 MG PO TABS: 80.0000 mg | ORAL_TABLET | Freq: Every day | ORAL | 3 refills | Status: DC

## 2016-09-24 MED ORDER — TICAGRELOR 90 MG PO TABS: 90.0000 mg | ORAL_TABLET | Freq: Two times a day (BID) | ORAL | 1 refills | Status: DC

## 2016-09-24 MED ORDER — LISINOPRIL 10 MG PO TABS: 10.0000 mg | ORAL_TABLET | Freq: Every day | ORAL | 3 refills | Status: DC

## 2016-09-24 MED ORDER — BUDESONIDE-FORMOTEROL FUMARATE 160-4.5 MCG/ACT IN AERO: 2.0000 | INHALATION_SPRAY | Freq: Two times a day (BID) | RESPIRATORY_TRACT | 0 refills | Status: DC

## 2016-09-24 MED ORDER — FLUOXETINE HCL 60 MG PO TABS: 60.0000 mg | ORAL_TABLET | Freq: Every day | ORAL | 3 refills | Status: DC

## 2016-09-24 MED ORDER — LORAZEPAM 0.5 MG PO TABS: 0.5000 mg | ORAL_TABLET | Freq: Two times a day (BID) | ORAL | 0 refills | Status: DC | PRN

## 2016-09-24 NOTE — Telephone Encounter (Signed)
Patient returned call and I let her know what Dr. Laural BenesJohnson said.

## 2016-09-24 NOTE — Telephone Encounter (Signed)
Over the counter biotene, or regular lemon drops.

## 2016-09-24 NOTE — Assessment & Plan Note (Signed)
Refills of her medicines sent to her pharmacy as she can't get to the other one. 90 days supplies given as otherwise she doesn't take them because she can't get there.

## 2016-09-24 NOTE — Assessment & Plan Note (Signed)
Not under good control. Will increase her prozac to 60mg  and recheck in 1 month. Continue wellbutrin and lorazepam and trazodone. Will refer to psychiatry. Referral generated today.

## 2016-09-24 NOTE — Telephone Encounter (Signed)
Patient would like advice on what she could use for dry mouth from taking her medications.   Please Advise.  Thank you

## 2016-09-24 NOTE — Assessment & Plan Note (Signed)
Will start her on symbicort. Recheck 1 month. Continue PRN albuterol.

## 2016-09-24 NOTE — Progress Notes (Signed)
BP 108/69 (BP Location: Left Arm, Patient Position: Sitting, Cuff Size: Normal)   Pulse 61   Temp 98 F (36.7 C)   Wt 148 lb 9.6 oz (67.4 kg)   SpO2 97%   BMI 23.98 kg/m    Subjective:    Patient ID: Natalie Taylor, female    DOB: January 30, 1961, 56 y.o.   MRN: [id number]  HPI: Natalie Taylor is a 56 y.o. female  Chief Complaint  Patient presents with  . Depression  . Medication Refill    flexeril and lorazepam   DEPRESSION- not doing any better on her medicine. Still very stressed. Would be OK seeing psychiatry Mood status: stable Satisfied with current treatment?: no Symptom severity: severe  Duration of current treatment : months Side effects: no Medication compliance: fair compliance Psychotherapy/counseling: no  Previous psychiatric medications: prozac, wellbutrin Depressed mood: yes Anxious mood: yes Anhedonia: no Significant weight loss or gain: no Insomnia: yes hard to fall asleep Fatigue: yes Feelings of worthlessness or guilt: yes Impaired concentration/indecisiveness: yes Suicidal ideations: yes Hopelessness: yes Crying spells: yes Depression screen [facility] 2/9 09/24/2016 09/02/2016 06/13/2016 05/29/2016 11/15/2015  Decreased Interest 2 2 1  0 2  Down, Depressed, Hopeless 3 3 3 3 1   PHQ - 2 Score 5 5 4 3 3   Altered sleeping 2 3 3 3 1   Tired, decreased energy 3 3 1 1 1   Change in appetite 3 2 1 3 1   Feeling bad or failure about yourself  3 3 3 2 1   Trouble concentrating 3 2 3 2 1   Moving slowly or fidgety/restless 1 2 1 2  0  Suicidal thoughts 1 0 1 3 0  PHQ-9 Score 21 20 17 19 8   Difficult doing work/chores - - - - -   Can't get to the pharmacy for her other medicines and sometimes doesn't take them- would like everything sent to [place] so she can pick them up when she comes here for her appointments.   Relevant past medical, surgical, family and social history reviewed and updated as indicated. Interim medical history since our last visit  reviewed. Allergies and medications reviewed and updated.  Review of Systems  Constitutional: Negative.   Respiratory: Positive for shortness of breath and wheezing. Negative for apnea, cough, choking, chest tightness and stridor.   Cardiovascular: Negative.   Musculoskeletal: Positive for back pain and myalgias. Negative for arthralgias, gait problem, joint swelling, neck pain and neck stiffness.  Psychiatric/Behavioral: Positive for confusion, decreased concentration, dysphoric mood, sleep disturbance and suicidal ideas. Negative for agitation, behavioral problems, hallucinations and self-injury. The patient is nervous/anxious. The patient is not hyperactive.     Per HPI unless specifically indicated above     Objective:    BP 108/69 (BP Location: Left Arm, Patient Position: Sitting, Cuff Size: Normal)   Pulse 61   Temp 98 F (36.7 C)   Wt 148 lb 9.6 oz (67.4 kg)   SpO2 97%   BMI 23.98 kg/m   Wt Readings from Last 3 Encounters:  09/24/16 148 lb 9.6 oz (67.4 kg)  09/02/16 148 lb 12.8 oz (67.5 kg)  09/01/16 150 lb (68 kg)    Physical Exam  Constitutional: She is oriented to person, place, and time. She appears well-developed and well-nourished. No distress.  HENT:  Head: Normocephalic and atraumatic.  Right Ear: Hearing normal.  Left Ear: Hearing normal.  Nose: Nose normal.  Eyes: Conjunctivae and lids are normal. Right eye exhibits no discharge. Left eye exhibits no  discharge. No scleral icterus.  Cardiovascular: Normal rate, regular rhythm, normal heart sounds and intact distal pulses.  Exam reveals no gallop and no friction rub.   No murmur heard. Pulmonary/Chest: Effort normal. No respiratory distress. She has wheezes in the right upper field, the right middle field, the right lower field, the left upper field, the left middle field and the left lower field. She has rales in the right lower field and the left lower field. She exhibits no tenderness.  Musculoskeletal:  Normal range of motion.  Neurological: She is alert and oriented to person, place, and time.  Skin: Skin is warm, dry and intact. No rash noted. She is not diaphoretic. No erythema. No pallor.  Psychiatric: Her speech is normal and behavior is normal. Judgment and thought content normal. Cognition and memory are normal. She exhibits a depressed mood.  Nursing note and vitals reviewed.   Results for orders placed or performed in visit on 07/29/16  Thyroid Panel With TSH  Result Value Ref Range   TSH 3.400 0.450 - 4.500 uIU/mL   T4, Total 8.4 4.5 - 12.0 ug/dL   T3 Uptake Ratio 25 24 - 39 %   Free Thyroxine Index 2.1 1.2 - 4.9  Basic metabolic panel  Result Value Ref Range   Glucose 90 65 - 99 mg/dL   BUN 21 6 - 24 mg/dL   Creatinine, Ser 4.09 (H) 0.57 - 1.00 mg/dL   GFR calc non Af Amer 62 >59 mL/min/1.73   GFR calc Af Amer 71 >59 mL/min/1.73   BUN/Creatinine Ratio 20 9 - 23   Sodium 137 134 - 144 mmol/L   Potassium 4.4 3.5 - 5.2 mmol/L   Chloride 97 96 - 106 mmol/L   CO2 21 18 - 29 mmol/L   Calcium 9.3 8.7 - 10.2 mg/dL      Assessment & Plan:   Problem List Items Addressed This Visit      Respiratory   COPD (chronic obstructive pulmonary disease) (HCC)    Will start her on symbicort. Recheck 1 month. Continue PRN albuterol.      Relevant Medications   budesonide-formoterol (SYMBICORT) 160-4.5 MCG/ACT inhaler     Genitourinary   Benign hypertensive renal disease    Under good control. Continue current regimen. Continue to monitor.       Overactive bladder    Refills of her medicines sent to her pharmacy as she can't get to the other one. 90 days supplies given as otherwise she doesn't take them because she can't get there.         Other   History of ST elevation myocardial infarction (STEMI)    Refills of her medicines sent to her pharmacy as she can't get to the other one. 90 days supplies given as otherwise she doesn't take them because she can't get there.        Depression - Primary    Not under good control. Will increase her prozac to 60mg  and recheck in 1 month. Continue wellbutrin and lorazepam and trazodone. Will refer to psychiatry. Referral generated today.      Relevant Medications   FLUoxetine HCl 60 MG TABS   traZODone (DESYREL) 50 MG tablet   buPROPion (WELLBUTRIN SR) 150 MG 12 hr tablet   LORazepam (ATIVAN) 0.5 MG tablet   Other Relevant Orders   Ambulatory referral to Psychiatry    Other Visit Diagnoses    Sciatica of left side       Continue flexeril PRN. Refill  given. Stretches given. Call if not getting better or getting worse.    Relevant Medications   FLUoxetine HCl 60 MG TABS   cyclobenzaprine (FLEXERIL) 10 MG tablet   traZODone (DESYREL) 50 MG tablet   buPROPion (WELLBUTRIN SR) 150 MG 12 hr tablet   LORazepam (ATIVAN) 0.5 MG tablet       Follow up plan: Return in about 4 weeks (around 10/22/2016) for Follow up mood and breathing.

## 2016-09-24 NOTE — Patient Instructions (Addendum)
[facility]  [address], [place], [place] [address] [phone]  Sciatica Rehab Ask your health care provider which exercises are safe for you. Do exercises exactly as told by your health care provider and adjust them as directed. It is normal to feel mild stretching, pulling, tightness, or discomfort as you do these exercises, but you should stop right away if you feel sudden pain or your pain gets worse.Do not begin these exercises until told by your health care provider. Stretching and range of motion exercises These exercises warm up your muscles and joints and improve the movement and flexibility of your hips and your back. These exercises also help to relieve pain, numbness, and tingling. Exercise A: Sciatic nerve glide 1. Sit in a chair with your head facing down toward your chest. Place your hands behind your back. Let your shoulders slump forward. 2. Slowly straighten one of your knees while you tilt your head back as if you are looking toward the ceiling. Only straighten your leg as far as you can without making your symptoms worse. 3. Hold for __________ seconds. 4. Slowly return to the starting position. 5. Repeat with your other leg. Repeat __________ times. Complete this exercise __________ times a day. Exercise B: Knee to chest with hip adduction and internal rotation  1. Lie on your back on a firm surface with both legs straight. 2. Bend one of your knees and move it up toward your chest until you feel a gentle stretch in your lower back and buttock. Then, move your knee toward the shoulder that is on the opposite side from your leg. ? Hold your leg in this position by holding onto the front of your knee. 3. Hold for __________ seconds. 4. Slowly return to the starting position. 5. Repeat with your other leg. Repeat __________ times. Complete this exercise __________ times a day. Exercise C: Prone extension on elbows  1. Lie on your  abdomen on a firm surface. A bed may be too soft for this exercise. 2. Prop yourself up on your elbows. 3. Use your arms to help lift your chest up until you feel a gentle stretch in your abdomen and your lower back. ? This will place some of your body weight on your elbows. If this is uncomfortable, try stacking pillows under your chest. ? Your hips should stay down, against the surface that you are lying on. Keep your hip and back muscles relaxed. 4. Hold for __________ seconds. 5. Slowly relax your upper body and return to the starting position. Repeat __________ times. Complete this exercise __________ times a day. Strengthening exercises These exercises build strength and endurance in your back. Endurance is the ability to use your muscles for a long time, even after they get tired. Exercise D: Pelvic tilt 1. Lie on your back on a firm surface. Bend your knees and keep your feet flat. 2. Tense your abdominal muscles. Tip your pelvis up toward the ceiling and flatten your lower back into the floor. ? To help with this exercise, you may place a small towel under your lower back and try to push your back into the towel. 3. Hold for __________ seconds. 4. Let your muscles relax completely before you repeat this exercise. Repeat __________ times. Complete this exercise __________ times a day. Exercise E: Alternating arm and leg raises  1. Get on your hands and knees on a firm surface. If you are on a hard floor, you may want to  use padding to cushion your knees, such as an exercise mat. 2. Line up your arms and legs. Your hands should be below your shoulders, and your knees should be below your hips. 3. Lift your left leg behind you. At the same time, raise your right arm and straighten it in front of you. ? Do not lift your leg higher than your hip. ? Do not lift your arm higher than your shoulder. ? Keep your abdominal and back muscles tight. ? Keep your hips facing the ground. ? Do not  arch your back. ? Keep your balance carefully, and do not hold your breath. 4. Hold for __________ seconds. 5. Slowly return to the starting position and repeat with your right leg and your left arm. Repeat __________ times. Complete this exercise __________ times a day. Posture and body mechanics  Body mechanics refers to the movements and positions of your body while you do your daily activities. Posture is part of body mechanics. Good posture and healthy body mechanics can help to relieve stress in your body's tissues and joints. Good posture means that your spine is in its natural S-curve position (your spine is neutral), your shoulders are pulled back slightly, and your head is not tipped forward. The following are general guidelines for applying improved posture and body mechanics to your everyday activities. Standing   When standing, keep your spine neutral and your feet about hip-width apart. Keep a slight bend in your knees. Your ears, shoulders, and hips should line up.  When you do a task in which you stand in one place for a long time, place one foot up on a stable object that is 2-4 inches (5-10 cm) high, such as a footstool. This helps keep your spine neutral. Sitting   When sitting, keep your spine neutral and keep your feet flat on the floor. Use a footrest, if necessary, and keep your thighs parallel to the floor. Avoid rounding your shoulders, and avoid tilting your head forward.  When working at a desk or a computer, keep your desk at a height where your hands are slightly lower than your elbows. Slide your chair under your desk so you are close enough to maintain good posture.  When working at a computer, place your monitor at a height where you are looking straight ahead and you do not have to tilt your head forward or downward to look at the screen. Resting   When lying down and resting, avoid positions that are most painful for you.  If you have pain with activities  such as sitting, bending, stooping, or squatting (flexion-based activities), lie in a position in which your body does not bend very much. For example, avoid curling up on your side with your arms and knees near your chest (fetal position).  If you have pain with activities such as standing for a long time or reaching with your arms (extension-based activities), lie with your spine in a neutral position and bend your knees slightly. Try the following positions: ? Lying on your side with a pillow between your knees. ? Lying on your back with a pillow under your knees. Lifting   When lifting objects, keep your feet at least shoulder-width apart and tighten your abdominal muscles.  Bend your knees and hips and keep your spine neutral. It is important to lift using the strength of your legs, not your back. Do not lock your knees straight out.  Always ask for help to lift heavy or awkward  objects. This information is not intended to replace advice given to you by your health care provider. Make sure you discuss any questions you have with your health care provider. Document Released: 04/07/2005 Document Revised: 12/13/2015 Document Reviewed: 12/22/2014 Elsevier Interactive Patient Education  [employer].

## 2016-09-24 NOTE — Assessment & Plan Note (Signed)
Under good control. Continue current regimen. Continue to monitor.  

## 2016-09-24 NOTE — Assessment & Plan Note (Signed)
Refills of her medicines sent to her pharmacy as she can't get to the other one. 90 days supplies given as otherwise she doesn't take them because she can't get there.  

## 2016-09-24 NOTE — Telephone Encounter (Signed)
Called and left patient a VM asking for her to please return my call.  

## 2016-10-02 ENCOUNTER — Telehealth: Payer: Self-pay | Admitting: Family Medicine

## 2016-10-02 NOTE — Telephone Encounter (Signed)
[facility]

## 2016-10-02 NOTE — Telephone Encounter (Signed)
Patient notified that the prescription was written on 09/24/16, and to check with her pharmacy to see if they have the prescription.

## 2016-10-06 ENCOUNTER — Encounter (INDEPENDENT_AMBULATORY_CARE_PROVIDER_SITE_OTHER): Payer: Self-pay

## 2016-10-13 ENCOUNTER — Ambulatory Visit: Payer: Medicaid Other | Admitting: Urology

## 2016-10-20 ENCOUNTER — Ambulatory Visit (INDEPENDENT_AMBULATORY_CARE_PROVIDER_SITE_OTHER): Payer: Medicaid Other | Admitting: Urology

## 2016-10-20 ENCOUNTER — Encounter: Payer: Self-pay | Admitting: Urology

## 2016-10-20 VITALS — BP 105/66 | HR 62 | Ht 66.0 in | Wt 145.9 lb

## 2016-10-20 DIAGNOSIS — N3946 Mixed incontinence: Secondary | ICD-10-CM | POA: Diagnosis not present

## 2016-10-20 LAB — BLADDER SCAN AMB NON-IMAGING: Scan Result: 87

## 2016-10-20 NOTE — Progress Notes (Signed)
10/20/2016 11:07 AM   Natalie Taylor 1960/12/10 [id number]  Referring provider: Dorcas Carrow, DO [address] [place], [place] [address]  Chief Complaint  Patient presents with  . Urinary Incontinence    HPI: The patient has mixed incontinence worsening over a number years. She leaks with coughing sneezing bending and lifting and running. She also reports urgency incontinence. She does report bedwetting of moderate severity. She normally does not wear pads.  Her flow sometimes is good and sometimes is a trickle.  The patient had grade 2 hypermobility of the bladder neck and a modest positive cough test. She had a grade 1 cystocele and grade 1 rectocele. She did have some rotational descent of the anterior vaginal wall but her apex was well supported  The maximum bladder capacity was 162 mL. The bladder was unstable reaching a pressure 26 cm water. She had voided off the contraction. Steward Drone was not able to assess her stress incontinence because she was triggering. During voluntary voiding she voided 104 mL with a maximum flow of mils per second. Maximum voiding pressure of 15 cm water. She empty officially. EMG activity increased during the voiding phase. Bladder neck descended 2 cm. Bladder was mildly trabeculated. Bladder was hypersensitive. There was question of a small urethral diverticulum. When I reviewed the x-rays the findings were somewhat nonspecific as noted  The patient has mixed incontinence and bedwetting. She has an impressive overactive bladder. If she did not reach her treatment goal with medical and behavioral therapy one would need to re-quantitate the stress component since I refractory therapy may be more prudent that a sling. If she ever had a sling I would order an MRI prior to rule out an occult urethral diverticulum  The patient understands the findings and notation about an MRI. I will see her back in 4-6 weeks on the beta 3 agonist 50 mg  Today Frequency is  stable. She no longer has bedwetting. She is at least 90% better with no urge incontinence. Clinically noninfected  PMH: Past Medical History:  Diagnosis Date  . Arthritis    hands  . COPD (chronic obstructive pulmonary disease) (HCC)   . Coronary artery disease   . GERD (gastroesophageal reflux disease)   . Headache    daily  . Hyperlipidemia   . Hypertension   . MI (myocardial infarction) (HCC)    x 2  . Shortness of breath dyspnea    pt attributes to meds  . Sleep apnea     Surgical History: Past Surgical History:  Procedure Laterality Date  . CARDIAC CATHETERIZATION N/A 09/27/2015   Procedure: Left Heart Cath and Coronary Angiography;  Surgeon: Alwyn Pea, MD;  Location: [facility] INVASIVE CV LAB;  Service: Cardiovascular;  Laterality: N/A;  . CARDIAC CATHETERIZATION N/A 09/27/2015   Procedure: Coronary Stent Intervention;  Surgeon: Alwyn Pea, MD;  Location: [facility] INVASIVE CV LAB;  Service: Cardiovascular;  Laterality: N/A;  . Cardiac stents  09/27/2015  . CHOLECYSTECTOMY    . COLONOSCOPY WITH PROPOFOL N/A 11/19/2015   Procedure: COLONOSCOPY WITH PROPOFOL;  Surgeon: Midge Minium, MD;  Location: [facility];  Service: Endoscopy;  Laterality: N/A;  . INDUCED ABORTION    . MULTIPLE TOOTH EXTRACTIONS    . TUBAL LIGATION      Home Medications:  Allergies as of 10/20/2016      Reactions   Cranberry Hives   Cranberry juice   Tape Rash      Medication List  Accurate as of 10/20/16 11:07 AM. Always use your most recent med list.          albuterol 108 (90 Base) MCG/ACT inhaler Commonly known as:  PROVENTIL HFA;VENTOLIN HFA Inhale 2 puffs into the lungs 2 (two) times daily.   atorvastatin 80 MG tablet Commonly known as:  LIPITOR Take 1 tablet (80 mg total) by mouth daily at 6 PM.   budesonide-formoterol 160-4.5 MCG/ACT inhaler Commonly known as:  SYMBICORT Inhale 2 puffs into the lungs 2 (two) times daily.   buPROPion 150 MG 12 hr  tablet Commonly known as:  WELLBUTRIN SR 1 tab daily for 1 week, then 1 tab BID   cyclobenzaprine 10 MG tablet Commonly known as:  FLEXERIL Take 1 tablet (10 mg total) by mouth 3 (three) times daily as needed for muscle spasms.   FLUoxetine HCl 60 MG Tabs Take 60 mg by mouth daily.   hydrochlorothiazide 25 MG tablet Commonly known as:  HYDRODIURIL Take 1 tablet (25 mg total) by mouth daily.   lisinopril 10 MG tablet Commonly known as:  PRINIVIL,ZESTRIL Take 1 tablet (10 mg total) by mouth daily.   LORazepam 0.5 MG tablet Commonly known as:  ATIVAN Take 1 tablet (0.5 mg total) by mouth 2 (two) times daily as needed for anxiety.   metoprolol tartrate 50 MG tablet Commonly known as:  LOPRESSOR Take 0.5 tablets (25 mg total) by mouth 2 (two) times daily.   mirabegron ER 50 MG Tb24 tablet Commonly known as:  MYRBETRIQ Take 1 tablet (50 mg total) by mouth daily.   omeprazole 20 MG capsule Commonly known as:  PRILOSEC Take 20 mg by mouth daily.   ticagrelor 90 MG Tabs tablet Commonly known as:  BRILINTA Take 1 tablet (90 mg total) by mouth 2 (two) times daily.   traZODone 50 MG tablet Commonly known as:  DESYREL Take 1-2 tablets (50-100 mg total) by mouth at bedtime as needed for sleep.       Allergies:  Allergies  Allergen Reactions  . Cranberry Hives    Cranberry juice  . Tape Rash    Family History: Family History  Problem Relation Age of Onset  . Alcohol abuse Mother   . Arthritis Mother   . Asthma Mother   . Cancer Mother   . Hypertension Mother   . Migraines Mother   . Hyperlipidemia Sister   . Hyperlipidemia Brother   . Heart disease Maternal Grandmother   . Emphysema Maternal Grandfather   . Breast cancer Paternal Aunt 30       double mastectomy    Social History:  reports that she has been smoking Cigarettes.  She has a 20.00 pack-year smoking history. She has never used smokeless tobacco. She reports that she does not drink alcohol or use  drugs.  ROS: UROLOGY Frequent Urination?: Yes Hard to postpone urination?: No Burning/pain with urination?: No Get up at night to urinate?: Yes Leakage of urine?: Yes Urine stream starts and stops?: Yes Trouble starting stream?: No Do you have to strain to urinate?: No Blood in urine?: No Urinary tract infection?: No Sexually transmitted disease?: No Injury to kidneys or bladder?: No Painful intercourse?: No Weak stream?: No Currently pregnant?: No Vaginal bleeding?: No Last menstrual period?: n  Gastrointestinal Nausea?: Yes Vomiting?: No Indigestion/heartburn?: No Diarrhea?: No Constipation?: Yes  Constitutional Fever: No Night sweats?: Yes Weight loss?: No Fatigue?: Yes  Skin Skin rash/lesions?: No Itching?: No  Eyes Blurred vision?: No Double vision?: No  Ears/Nose/Throat Sore  throat?: No Sinus problems?: No  Hematologic/Lymphatic Swollen glands?: No Easy bruising?: Yes  Cardiovascular Leg swelling?: No Chest pain?: No  Respiratory Cough?: No Shortness of breath?: Yes  Endocrine Excessive thirst?: Yes  Musculoskeletal Back pain?: Yes Joint pain?: No  Neurological Headaches?: Yes Dizziness?: Yes  Psychologic Depression?: Yes Anxiety?: Yes  Physical Exam: BP 105/66 (BP Location: Left Arm, Patient Position: Sitting, Cuff Size: Normal)   Pulse 62   Ht 5\' 6"  (1.676 m)   Wt 145 lb 14.4 oz (66.2 kg)   BMI 23.55 kg/m   Constitutional:  Alert and oriented, No acute distress.   Laboratory Data: Lab Results  Component Value Date   WBC 8.7 05/29/2016   HGB 12.8 05/29/2016   HCT 38.0 05/29/2016   MCV 88 05/29/2016   PLT 200 05/29/2016    Lab Results  Component Value Date   CREATININE 1.03 (H) 07/29/2016    No results found for: PSA  No results found for: TESTOSTERONE  Lab Results  Component Value Date   HGBA1C  09/22/2009    5.3 (NOTE)                                                                       According to  the ADA Clinical Practice Recommendations for 2011, when HbA1c is used as a screening test:   >=6.5%   Diagnostic of Diabetes Mellitus           (if abnormal result  is confirmed)  5.7-6.4%   Increased risk of developing Diabetes Mellitus  References:Diagnosis and Classification of Diabetes Mellitus,Diabetes Care,2011,34(Suppl 1):S62-S69 and Standards of Medical Care in         Diabetes - 2011,Diabetes Care,2011,34  (Suppl 1):S11-S61.    Urinalysis    Component Value Date/Time   COLORURINE YELLOW (A) 10/09/2015 2303   APPEARANCEUR Cloudy (A) 06/30/2016 1015   LABSPEC 1.009 10/09/2015 2303   PHURINE 7.0 10/09/2015 2303   GLUCOSEU Negative 06/30/2016 1015   HGBUR 1+ (A) 10/09/2015 2303   BILIRUBINUR Negative 06/30/2016 1015   KETONESUR NEGATIVE 10/09/2015 2303   PROTEINUR Negative 06/30/2016 1015   PROTEINUR 30 (A) 10/09/2015 2303   UROBILINOGEN 1.0 09/21/2009 0534   NITRITE Positive (A) 06/30/2016 1015   NITRITE NEGATIVE 10/09/2015 2303   LEUKOCYTESUR Trace (A) 06/30/2016 1015    Pertinent Imaging: none  Assessment & Plan:  Reevaluate on beta 3 agonist in 4 months. I will reassess her continence and perhaps her residual stress incontinence  1. Mixed incontinence  - Bladder Scan (Post Void Residual) in office   No Follow-up on file.  Martina Sinner, MD  [facility] [address], [address] [place], [place] [address] [phone]

## 2016-10-21 ENCOUNTER — Other Ambulatory Visit: Payer: Self-pay | Admitting: Family Medicine

## 2016-10-28 ENCOUNTER — Encounter: Payer: Self-pay | Admitting: Family Medicine

## 2016-10-28 ENCOUNTER — Ambulatory Visit (INDEPENDENT_AMBULATORY_CARE_PROVIDER_SITE_OTHER): Payer: Medicaid Other | Admitting: Family Medicine

## 2016-10-28 ENCOUNTER — Other Ambulatory Visit: Payer: Self-pay | Admitting: Family Medicine

## 2016-10-28 VITALS — BP 108/56 | HR 54 | Temp 97.5°F | Wt 148.5 lb

## 2016-10-28 DIAGNOSIS — F332 Major depressive disorder, recurrent severe without psychotic features: Secondary | ICD-10-CM | POA: Diagnosis not present

## 2016-10-28 DIAGNOSIS — R040 Epistaxis: Secondary | ICD-10-CM

## 2016-10-28 MED ORDER — FLUOXETINE HCL 60 MG PO TABS
60.0000 mg | ORAL_TABLET | Freq: Every day | ORAL | 1 refills | Status: DC
Start: 2016-10-28 — End: 2017-04-24

## 2016-10-28 MED ORDER — BUPROPION HCL ER (SR) 200 MG PO TB12: ORAL_TABLET | ORAL | 1 refills | Status: DC

## 2016-10-28 NOTE — Telephone Encounter (Signed)
OK to call in

## 2016-10-28 NOTE — Assessment & Plan Note (Signed)
Not under good control. List of psychiatrists provided. [facility] not able to see her. Will have referral coordinator look into other options. Will increase wellbutrin to 200mg  BID and recheck in 1 month. Continue all other meds.

## 2016-10-28 NOTE — Telephone Encounter (Signed)
Called into [facility] verbal to [place].

## 2016-10-28 NOTE — Progress Notes (Signed)
BP (!) 108/56 (BP Location: Left Arm, Patient Position: Sitting, Cuff Size: Normal)   Pulse (!) 54   Temp (!) 97.5 F (36.4 C)   Wt 148 lb 8 oz (67.4 kg)   SpO2 98%   BMI 23.97 kg/m    Subjective:    Patient ID: Natalie Taylor, female    DOB: 09/23/1960, 56 y.o.   MRN: [id number]  HPI: Natalie Taylor is a 56 y.o. female  Chief Complaint  Patient presents with  . Depression   DEPRESSION- Not doing any better. Feeling low and tired. Has not seen psychiatry.  Mood status: stable Satisfied with current treatment?: no Symptom severity: moderate  Duration of current treatment : chronic Side effects: no Medication compliance: excellent compliance Psychotherapy/counseling: no  Previous psychiatric medications: prozac, wellbutrin, lorazepam Depressed mood: yes Anxious mood: yes Anhedonia: yes Significant weight loss or gain: no Insomnia: yes hard to fall asleep Fatigue: yes Feelings of worthlessness or guilt: yes Impaired concentration/indecisiveness: yes Suicidal ideations: yes Hopelessness: yes Crying spells: yes [facility] 10/28/2016 09/24/2016 09/02/2016 06/13/2016 05/29/2016  Decreased Interest 2 2 2 1  0  Down, Depressed, Hopeless 3 3 3 3 3   PHQ - 2 Score 5 5 5 4 3   Altered sleeping 3 2 3 3 3   Tired, decreased energy 2 3 3 1 1   Change in appetite 2 3 2 1 3   Feeling bad or failure about yourself  2 3 3 3 2   Trouble concentrating 2 3 2 3 2   Moving slowly or fidgety/restless 2 1 2 1 2   Suicidal thoughts 1 1 0 1 3  PHQ-9 Score 19 21 20 17 19   Difficult doing work/chores - - - - -   GAD 7 : Generalized Anxiety Score 10/28/2016 07/29/2016 11/01/2015  Nervous, Anxious, on Edge 3 3 3   Control/stop worrying 3 1 3   Worry too much - different things 3 3 3   Trouble relaxing 2 3 3   Restless 2 2 3   Easily annoyed or irritable 2 1 3   Afraid - awful might happen 2 3 3   Total GAD 7 Score 17 16 21   Anxiety Difficulty - Not difficult at all Very difficult    Had  nose bleed on Thursday and Friday, Moderate amount of blood, small amount down the back of her throat. Stopped after about 5-10 minutes. No repeats  Relevant past medical, surgical, family and social history reviewed and updated as indicated. Interim medical history since our last visit reviewed. Allergies and medications reviewed and updated.  Review of Systems  Constitutional: Negative.   HENT: Positive for nosebleeds. Negative for congestion, dental problem, drooling, ear discharge, ear pain, facial swelling, hearing loss, mouth sores, postnasal drip, rhinorrhea, sinus pain, sinus pressure, sneezing, sore throat, tinnitus, trouble swallowing and voice change.   Respiratory: Negative.   Cardiovascular: Negative.   Psychiatric/Behavioral: Negative.     Per HPI unless specifically indicated above     Objective:    BP (!) 108/56 (BP Location: Left Arm, Patient Position: Sitting, Cuff Size: Normal)   Pulse (!) 54   Temp (!) 97.5 F (36.4 C)   Wt 148 lb 8 oz (67.4 kg)   SpO2 98%   BMI 23.97 kg/m   Wt Readings from Last 3 Encounters:  10/28/16 148 lb 8 oz (67.4 kg)  10/20/16 145 lb 14.4 oz (66.2 kg)  09/24/16 148 lb 9.6 oz (67.4 kg)    Physical Exam  Constitutional: She is oriented to person, place, and  time. She appears well-developed and well-nourished. No distress.  HENT:  Head: Normocephalic and atraumatic.  Right Ear: Hearing and external ear normal.  Left Ear: Hearing and external ear normal.  Mouth/Throat: Oropharynx is clear and moist. No oropharyngeal exudate.  Very dry nares   Eyes: Conjunctivae, EOM and lids are normal. Pupils are equal, round, and reactive to light. Right eye exhibits no discharge. Left eye exhibits no discharge. No scleral icterus.  Cardiovascular: Normal rate, regular rhythm, normal heart sounds and intact distal pulses.  Exam reveals no gallop and no friction rub.   No murmur heard. Pulmonary/Chest: Effort normal and breath sounds normal. No  respiratory distress. She has no wheezes. She has no rales. She exhibits no tenderness.  Musculoskeletal: Normal range of motion.  Neurological: She is alert and oriented to person, place, and time.  Skin: Skin is warm, dry and intact. No rash noted. She is not diaphoretic. No erythema. No pallor.  Psychiatric: Her speech is normal and behavior is normal. Judgment and thought content normal. Her mood appears anxious. Cognition and memory are normal. She exhibits a depressed mood.  Nursing note and vitals reviewed.   Results for orders placed or performed in visit on 10/20/16  Bladder Scan (Post Void Residual) in office  Result Value Ref Range   Scan Result 87       Assessment & Plan:   Problem List Items Addressed This Visit      Other   Depression - Primary    Not under good control. List of psychiatrists provided. [employer] not able to see her. Will have referral coordinator look into other options. Will increase wellbutrin to 200mg  BID and recheck in 1 month. Continue all other meds.       Relevant Medications   FLUoxetine HCl 60 MG TABS   buPROPion (WELLBUTRIN SR) 200 MG 12 hr tablet    Other Visit Diagnoses    Epistaxis       Likely due to dry air. Start ayr gel or vasaline. Call with any concerns.        Follow up plan: Return in about 4 weeks (around 11/25/2016) for follow up mood.

## 2016-10-29 ENCOUNTER — Other Ambulatory Visit: Payer: Self-pay | Admitting: Internal Medicine

## 2016-10-29 DIAGNOSIS — I2510 Atherosclerotic heart disease of native coronary artery without angina pectoris: Secondary | ICD-10-CM

## 2016-10-29 DIAGNOSIS — R0602 Shortness of breath: Secondary | ICD-10-CM

## 2016-10-29 DIAGNOSIS — I208 Other forms of angina pectoris: Secondary | ICD-10-CM

## 2016-10-30 ENCOUNTER — Telehealth: Payer: Self-pay | Admitting: Family Medicine

## 2016-10-30 DIAGNOSIS — Z598 Other problems related to housing and economic circumstances: Secondary | ICD-10-CM

## 2016-10-30 DIAGNOSIS — Z5982 Transportation insecurity: Secondary | ICD-10-CM

## 2016-10-30 DIAGNOSIS — Z5989 Other problems related to housing and economic circumstances: Secondary | ICD-10-CM

## 2016-10-30 NOTE — Telephone Encounter (Signed)
[employer] denied referral.  Will try Dr. Maryruth BunKapur next.

## 2016-10-30 NOTE — Telephone Encounter (Signed)
Natalie Taylor from [facility] called regarding the referral she refused the appt because she wanted to stay local in [place] and they did not have anything but [facility] available.  Just FYI

## 2016-10-30 NOTE — Telephone Encounter (Signed)
Dr Lurlean LeydenKapurs office called stating they do not accept medicaid

## 2016-10-30 NOTE — Telephone Encounter (Signed)
[employer] denied referral for service being available at clinic. Dr. Maryruth BunKapur denied due to not accepting medicaid.  Patient refused to drive [place] [facility].  Dr. Laural BenesJohnson, What should the next step be?

## 2016-10-30 NOTE — Telephone Encounter (Signed)
Received a call from Dr Lurlean LeydenKapurs office regarding the referral on pt. They do not accept medicaid.  Just FYI  Thanks

## 2016-10-30 NOTE — Telephone Encounter (Signed)
She needs to see psychiatry, I cannot continue to treat her, which we have discussed. If transportation is an issue, can we see if [facility] can help with that? I need her to see a psychiatrist.

## 2016-10-31 ENCOUNTER — Ambulatory Visit: Payer: Medicaid Other

## 2016-11-03 ENCOUNTER — Ambulatory Visit
Admission: RE | Admit: 2016-11-03 | Discharge: 2016-11-03 | Disposition: A | Discharge status: Home / Self Care | Payer: Medicaid Other | Source: Ambulatory Visit | Attending: Internal Medicine | Admitting: Internal Medicine

## 2016-11-03 DIAGNOSIS — I2510 Atherosclerotic heart disease of native coronary artery without angina pectoris: Secondary | ICD-10-CM | POA: Diagnosis not present

## 2016-11-03 DIAGNOSIS — I208 Other forms of angina pectoris: Secondary | ICD-10-CM | POA: Diagnosis not present

## 2016-11-03 DIAGNOSIS — R0602 Shortness of breath: Secondary | ICD-10-CM | POA: Insufficient documentation

## 2016-11-03 MED ORDER — TECHNETIUM TC 99M TETROFOSMIN IV KIT
31.8160 | PACK | Freq: Once | INTRAVENOUS | Status: AC | PRN
  Administered 2016-11-03: 31.816 via INTRAVENOUS

## 2016-11-03 MED ORDER — REGADENOSON 0.4 MG/5ML IV SOLN
0.4000 mg | Freq: Once | INTRAVENOUS | Status: AC
  Administered 2016-11-03: 0.4 mg via INTRAVENOUS

## 2016-11-03 MED ORDER — TECHNETIUM TC 99M TETROFOSMIN IV KIT
12.8750 | PACK | Freq: Once | INTRAVENOUS | Status: AC | PRN
  Administered 2016-11-03: 12.875 via INTRAVENOUS

## 2016-11-04 LAB — NM MYOCAR MULTI W/SPECT W/WALL MOTION / EF
Estimated workload: 1 METS
Exercise duration (min): 1 min
Exercise duration (sec): 4 s
LV dias vol: 89 mL (ref 46–106)
LV sys vol: 50 mL
MPHR: 166 {beats}/min
Peak HR: 72 {beats}/min
Percent HR: 48 %
Percent of predicted max HR: 43 %
Rest HR: 49 {beats}/min
SDS: 6
SRS: 9
SSS: 15
Stage 1 Grade: 0 %
Stage 1 HR: 51 {beats}/min
Stage 1 Speed: 0 mph
Stage 2 Grade: 0 %
Stage 2 HR: 51 {beats}/min
Stage 2 Speed: 0 mph
Stage 3 Grade: 0 %
Stage 3 HR: 72 {beats}/min
Stage 3 Speed: 0 mph
Stage 4 Grade: 0 %
Stage 4 HR: 79 {beats}/min
Stage 4 Speed: 0 mph
Stage 5 DBP: 59 mmHg
Stage 5 Grade: 0 %
Stage 5 HR: 73 {beats}/min
Stage 5 SBP: 122 mmHg
Stage 5 Speed: 0 mph
TID: 1

## 2016-11-07 ENCOUNTER — Ambulatory Visit
Admission: RE | Admit: 2016-11-07 | Discharge: 2016-11-07 | Disposition: A | Discharge status: Home / Self Care | Payer: Medicaid Other | Source: Ambulatory Visit | Attending: Internal Medicine | Admitting: Internal Medicine

## 2016-11-07 DIAGNOSIS — R0602 Shortness of breath: Secondary | ICD-10-CM | POA: Insufficient documentation

## 2016-11-07 DIAGNOSIS — I083 Combined rheumatic disorders of mitral, aortic and tricuspid valves: Secondary | ICD-10-CM | POA: Insufficient documentation

## 2016-11-07 DIAGNOSIS — I10 Essential (primary) hypertension: Secondary | ICD-10-CM | POA: Diagnosis not present

## 2016-11-07 DIAGNOSIS — I208 Other forms of angina pectoris: Secondary | ICD-10-CM

## 2016-11-07 DIAGNOSIS — J449 Chronic obstructive pulmonary disease, unspecified: Secondary | ICD-10-CM | POA: Insufficient documentation

## 2016-11-07 DIAGNOSIS — I2510 Atherosclerotic heart disease of native coronary artery without angina pectoris: Secondary | ICD-10-CM

## 2016-11-07 DIAGNOSIS — E785 Hyperlipidemia, unspecified: Secondary | ICD-10-CM | POA: Insufficient documentation

## 2016-11-07 DIAGNOSIS — G4730 Sleep apnea, unspecified: Secondary | ICD-10-CM | POA: Insufficient documentation

## 2016-11-07 DIAGNOSIS — I252 Old myocardial infarction: Secondary | ICD-10-CM | POA: Diagnosis not present

## 2016-11-07 DIAGNOSIS — I25118 Atherosclerotic heart disease of native coronary artery with other forms of angina pectoris: Secondary | ICD-10-CM | POA: Insufficient documentation

## 2016-11-07 NOTE — Progress Notes (Signed)
*  PRELIMINARY RESULTS* Echocardiogram 2D Echocardiogram has been performed.  Cristela BlueHege, Dariane Natzke 11/07/2016, 11:55 AM

## 2016-11-07 NOTE — Telephone Encounter (Signed)
All set!

## 2016-11-07 NOTE — Telephone Encounter (Signed)
Please put order into [facility]. Patient willing to go [place] [facility], just needs transportation.

## 2016-11-28 ENCOUNTER — Encounter: Payer: Self-pay | Admitting: Family Medicine

## 2016-11-28 ENCOUNTER — Telehealth: Payer: Self-pay

## 2016-11-28 ENCOUNTER — Ambulatory Visit (INDEPENDENT_AMBULATORY_CARE_PROVIDER_SITE_OTHER): Payer: Medicaid Other | Admitting: Family Medicine

## 2016-11-28 VITALS — BP 107/73 | HR 78 | Temp 98.6°F | Wt 147.2 lb

## 2016-11-28 DIAGNOSIS — J441 Chronic obstructive pulmonary disease with (acute) exacerbation: Secondary | ICD-10-CM | POA: Diagnosis not present

## 2016-11-28 DIAGNOSIS — K649 Unspecified hemorrhoids: Secondary | ICD-10-CM | POA: Diagnosis not present

## 2016-11-28 DIAGNOSIS — N3281 Overactive bladder: Secondary | ICD-10-CM

## 2016-11-28 DIAGNOSIS — N393 Stress incontinence (female) (male): Secondary | ICD-10-CM

## 2016-11-28 DIAGNOSIS — F332 Major depressive disorder, recurrent severe without psychotic features: Secondary | ICD-10-CM | POA: Diagnosis not present

## 2016-11-28 DIAGNOSIS — Z748 Other problems related to care provider dependency: Secondary | ICD-10-CM

## 2016-11-28 MED ORDER — LORAZEPAM 0.5 MG PO TABS: ORAL_TABLET | ORAL | 0 refills | Status: DC

## 2016-11-28 MED ORDER — PREDNISONE 10 MG PO TABS: ORAL_TABLET | ORAL | 0 refills | Status: DC

## 2016-11-28 MED ORDER — HYDROCORTISONE ACETATE 25 MG RE SUPP: 25.0000 mg | Freq: Two times a day (BID) | RECTAL | 0 refills | Status: DC

## 2016-11-28 MED ORDER — LEVOFLOXACIN 750 MG PO TABS: 750.0000 mg | ORAL_TABLET | Freq: Every day | ORAL | 0 refills | Status: DC

## 2016-11-28 NOTE — Progress Notes (Signed)
BP 107/73 (BP Location: Left Arm, Patient Position: Sitting, Cuff Size: Normal)   Pulse 78   Temp 98.6 F (37 C)   Wt 147 lb 3 oz (66.8 kg)   LMP 11/22/2016 (Approximate)   SpO2 93%   BMI 23.76 kg/m    Subjective:    Patient ID: Natalie Taylor, female    DOB: 1960/10/21, 56 y.o.   MRN: [id number]  HPI: Natalie Taylor is a 56 y.o. female  Chief Complaint  Patient presents with  . Depression  . URI    X 2 days, cough, nasal congestion, SOB  . Hemorrhoids   DEPRESSION- has not seen psychiatry. Has not called psychiatry. No local places accept her insurance. Will see [facility] if she can get a ride. She notes that she is feeling terrible. She is no better. Worse if anything.  Mood status: exacerbated Satisfied with current treatment?: no Symptom severity: severe  Duration of current treatment : chronic Side effects: no Medication compliance: good compliance Psychotherapy/counseling: no  Depressed mood: yes Anxious mood: yes Anhedonia: yes Significant weight loss or gain: no Insomnia: no  Fatigue: yes Feelings of worthlessness or guilt: yes Impaired concentration/indecisiveness: yes Suicidal ideations: no Hopelessness: yes Crying spells: yes Depression screen [facility] 2/9 10/28/2016 09/24/2016 09/02/2016 06/13/2016 05/29/2016  Decreased Interest 2 2 2 1  0  Down, Depressed, Hopeless 3 3 3 3 3   PHQ - 2 Score 5 5 5 4 3   Altered sleeping 3 2 3 3 3   Tired, decreased energy 2 3 3 1 1   Change in appetite 2 3 2 1 3   Feeling bad or failure about yourself  2 3 3 3 2   Trouble concentrating 2 3 2 3 2   Moving slowly or fidgety/restless 2 1 2 1 2   Suicidal thoughts 1 1 0 1 3  PHQ-9 Score 19 21 20 17 19   Difficult doing work/chores - - - - -   UPPER RESPIRATORY TRACT INFECTION Duration: couple of days Worst symptom: SOB Fever: yes Cough: yes Shortness of breath: yes Wheezing: yes Chest pain: yes, with cough Chest tightness: yes Chest congestion: yes Nasal congestion: yes Runny nose:  yes Post nasal drip: yes Sneezing: yes Sore throat: yes Swollen glands: no Sinus pressure: yes Headache: yes Face pain: no Toothache: no Ear pain: yes left Ear pressure: yes left Eyes red/itching:yes Eye drainage/crusting: yes  Vomiting: no Rash: no Fatigue: yes Sick contacts: no Strep contacts: no  Context: worse Recurrent sinusitis: no Relief with OTC cold/cough medications: no  Treatments attempted: none   Hemorrhoids x4 days- still bothering her. Using preparation H without benefit.    Relevant past medical, surgical, family and social history reviewed and updated as indicated. Interim medical history since our last visit reviewed. Allergies and medications reviewed and updated.  Review of Systems  Constitutional: Negative.   HENT: Negative.   Respiratory: Positive for cough, chest tightness, shortness of breath and wheezing. Negative for apnea, choking and stridor.   Cardiovascular: Negative.   Neurological: Negative.   Psychiatric/Behavioral: Positive for decreased concentration and dysphoric mood. Negative for agitation, behavioral problems, confusion, hallucinations, self-injury, sleep disturbance and suicidal ideas. The patient is nervous/anxious. The patient is not hyperactive.     Per HPI unless specifically indicated above     Objective:    BP 107/73 (BP Location: Left Arm, Patient Position: Sitting, Cuff Size: Normal)   Pulse 78   Temp 98.6 F (37 C)   Wt 147 lb 3 oz (66.8 kg)  LMP 11/22/2016 (Approximate)   SpO2 93%   BMI 23.76 kg/m   Wt Readings from Last 3 Encounters:  11/28/16 147 lb 3 oz (66.8 kg)  10/28/16 148 lb 8 oz (67.4 kg)  10/20/16 145 lb 14.4 oz (66.2 kg)    Physical Exam  Constitutional: She is oriented to person, place, and time. She appears well-developed and well-nourished. No distress.  HENT:  Head: Normocephalic and atraumatic.  Right Ear: Hearing normal.  Left Ear: Hearing normal.  Nose: Nose normal.  Eyes: Conjunctivae  and lids are normal. Right eye exhibits no discharge. Left eye exhibits no discharge. No scleral icterus.  Cardiovascular: Normal rate, regular rhythm, normal heart sounds and intact distal pulses.  Exam reveals no gallop and no friction rub.   No murmur heard. Pulmonary/Chest: Effort normal. No respiratory distress. She has wheezes. She has no rales. She exhibits no tenderness.  Musculoskeletal: Normal range of motion.  Neurological: She is alert and oriented to person, place, and time.  Skin: Skin is warm, dry and intact. No rash noted. She is not diaphoretic. No erythema. No pallor.  Psychiatric: Her speech is normal and behavior is normal. Judgment and thought content normal. Her mood appears anxious. Cognition and memory are normal. She exhibits a depressed mood.  Nursing note and vitals reviewed.   Results for orders placed or performed during the hospital encounter of 11/03/16  NM Myocar Multi W/Spect W/Wall Motion / EF  Result Value Ref Range   Rest HR 49 bpm   Rest BP 117/64 mmHg   Exercise duration (sec) 4 sec   Percent HR 48 %   Exercise duration (min) 1 min   Estimated workload 1.0 METS   Peak HR 72 BPM   Peak BP  mmHg   MPHR 166 bpm   SSS 15    SRS 9    SDS 6    TID 1.00    LV sys vol 50 mL   LV dias vol 89 46 - 106 mL   Phase 1 name PREINFSN    Stage 1 Name SUPINE    Stage 1 Time 00:00:57    Stage 1 Speed 0.0 mph   Stage 1 Grade 0.0 %   Stage 1 HR 51 bpm   Phase 2 Name Infusion    Stage 2 Name DOSE 1    Stage 2 Attribute Baseline    Stage 2 Time 00:00:01    Stage 2 Speed 0.0 mph   Stage 2 Grade 0.0 %   Stage 2 HR 51 bpm   Phase 3 Name Infusion    Stage 3 Name DOSE 1    Stage 3 Attribute Peak    Stage 3 Time 00:01:05    Stage 3 Speed 0.0 mph   Stage 3 Grade 0.0 %   Stage 3 HR 72 bpm   Phase 4 Name POSTINFSN    Stage 4 Attribute Recovery 1min    Stage 4 Time 00:00:59    Stage 4 Speed 0.0 mph   Stage 4 Grade 0.0 %   Stage 4 HR 79 bpm   Phase 5 Name  POSTINFSN    Stage 5 Time 00:04:00    Stage 5 Speed 0.0 mph   Stage 5 Grade 0.0 %   Stage 5 HR 73 bpm   Stage 5 SBP 122 mmHg   Stage 5 DBP 59 mmHg   Percent of predicted max HR 43 %      Assessment & Plan:   Problem List  Items Addressed This Visit      Respiratory   COPD (chronic obstructive pulmonary disease) (HCC) - Primary    Will treat with prednisone taper and azithromycin. Call with any concerns. Recheck 2 weeks.       Relevant Medications   predniSONE (DELTASONE) 10 MG tablet     Genitourinary   Overactive bladder     Other   Depression    Not under good control. Needs to see psychiatry. Appointment scheduled today. Ride set up. Continue current meds until they see her.       Relevant Medications   LORazepam (ATIVAN) 0.5 MG tablet    Other Visit Diagnoses    Stress incontinence in female       Still acting up. Will continue current regimen. Recheck at follow up, if not better, will get back into urology.   Hemorrhoids, unspecified hemorrhoid type       Rx for suppositories sent to her pharmacy. Let us know if not getting better or getting worse.        Follow up plan: Return in about 2 weeks (around 12/12/2016) for lung recheck.

## 2016-11-28 NOTE — Telephone Encounter (Signed)
Patient in office today seeing provider, she has some transportation concerns. Spoke with patient, she will be transporting home today through [employer]. Willl send referral to [facility] care team for future transportation assistance.

## 2016-12-02 NOTE — Assessment & Plan Note (Signed)
Will treat with prednisone taper and azithromycin. Call with any concerns. Recheck 2 weeks.

## 2016-12-02 NOTE — Assessment & Plan Note (Signed)
Not under good control. Needs to see psychiatry. Appointment scheduled today. Ride set up. Continue current meds until they see her.

## 2016-12-05 ENCOUNTER — Encounter: Payer: Self-pay | Admitting: Family Medicine

## 2016-12-05 ENCOUNTER — Ambulatory Visit (INDEPENDENT_AMBULATORY_CARE_PROVIDER_SITE_OTHER): Payer: Medicaid Other | Admitting: Family Medicine

## 2016-12-05 VITALS — BP 127/77 | HR 58 | Temp 97.5°F | Wt 147.0 lb

## 2016-12-05 DIAGNOSIS — J441 Chronic obstructive pulmonary disease with (acute) exacerbation: Secondary | ICD-10-CM

## 2016-12-05 DIAGNOSIS — B009 Herpesviral infection, unspecified: Secondary | ICD-10-CM

## 2016-12-05 DIAGNOSIS — K219 Gastro-esophageal reflux disease without esophagitis: Secondary | ICD-10-CM

## 2016-12-05 DIAGNOSIS — B370 Candidal stomatitis: Secondary | ICD-10-CM

## 2016-12-05 MED ORDER — MAGIC MOUTHWASH W/LIDOCAINE: 5.0000 mL | Freq: Three times a day (TID) | ORAL | 1 refills | Status: DC | PRN

## 2016-12-05 MED ORDER — PREDNISONE 10 MG PO TABS: ORAL_TABLET | ORAL | 0 refills | Status: DC

## 2016-12-05 MED ORDER — OMEPRAZOLE 20 MG PO CPDR: 20.0000 mg | DELAYED_RELEASE_CAPSULE | Freq: Two times a day (BID) | ORAL | 1 refills | Status: DC

## 2016-12-05 MED ORDER — VALACYCLOVIR HCL 1 G PO TABS: 1000.0000 mg | ORAL_TABLET | Freq: Two times a day (BID) | ORAL | 3 refills | Status: DC

## 2016-12-05 MED ORDER — DOXYCYCLINE HYCLATE 100 MG PO TABS: 100.0000 mg | ORAL_TABLET | Freq: Two times a day (BID) | ORAL | 0 refills | Status: DC

## 2016-12-05 MED ORDER — ALBUTEROL SULFATE (2.5 MG/3ML) 0.083% IN NEBU
2.5000 mg | INHALATION_SOLUTION | Freq: Once | RESPIRATORY_TRACT | Status: AC
  Administered 2016-12-05: 2.5 mg via RESPIRATORY_TRACT

## 2016-12-05 NOTE — Patient Instructions (Addendum)
For your reflux: Bland diet, don't lay flat any sooner than 1 hour after eating a meal Take your prilosec (or nexium sample) 30-1 hour before eating or drinking anything other than water. Can take a second dose with dinner, and can add TUMS or zantac additionally  For your breathing: Start over with your prednisone taper, and tack on the few extras you have leftover to the end of that Use symbicort twice daily, rinse mouth well with water after Use albuterol inhaler as needed Use mouthwash (swish and spit) 3-4 times daily   START PROBIOTIC 1-2 times DAILY!!!  Start valtrex as directed for cold sores

## 2016-12-05 NOTE — Progress Notes (Signed)
BP 127/77   Pulse (!) 58   Temp (!) 97.5 F (36.4 C) (Oral)   Wt 147 lb (66.7 kg)   LMP 11/22/2016 (Approximate)   SpO2 92%   BMI 23.73 kg/m    Subjective:    Patient ID: Natalie Taylor, female    DOB: [birth year], 56 y.o.   MRN: [id number]  HPI: Natalie Taylor is a 56 y.o. female  Chief Complaint  Patient presents with  . Oral Pain    Dry throught, sore throat, bumps in mouth, burining tounge, started Wednesday.    Patient presents with tongue, mouth, and throat pain x 2-3 days with white coating. Sxs started after starting prednisone and levaquin for a COPD exacerbation. Takes her symbicort occasionally, has tried avoiding it d/t risk of thrush. Chest tightness, SOB, wheezing, productive cough no better after completion of levaquin and most of her prednisone taper. No fevers, CP, diaphoresis.   Struggling with worsening reflux sxs the past few weeks as well. Takes 20 mg prilosec daily with breakfast. Has been taking prn TUMS with some relief.   Also having more frequent flares of cold sores. Has never been treated for them in the past.   Past Medical History:  Diagnosis Date  . Arthritis    hands  . COPD (chronic obstructive pulmonary disease) (HCC)   . Coronary artery disease   . GERD (gastroesophageal reflux disease)   . Headache    daily  . Hyperlipidemia   . Hypertension   . MI (myocardial infarction) (HCC)    x 2  . Shortness of breath dyspnea    pt attributes to meds  . Sleep apnea    Social History   Social History  . Marital status: Divorced    Spouse name: N/A  . Number of children: N/A  . Years of education: N/A   Occupational History  . Not on file.   Social History Main Topics  . Smoking status: Current Every Day Smoker    Packs/day: 0.50    Years: 40.00    Types: Cigarettes  . Smokeless tobacco: Never Used  . Alcohol use No  . Drug use: No  . Sexual activity: Yes   Other Topics Concern  . Not on file   Social History Narrative  .  No narrative on file   Relevant past medical, surgical, family and social history reviewed and updated as indicated. Interim medical history since our last visit reviewed. Allergies and medications reviewed and updated.  Review of Systems  Constitutional: Negative.   HENT: Positive for mouth sores.   Respiratory: Positive for cough, chest tightness, shortness of breath and wheezing.   Cardiovascular: Negative.   Gastrointestinal:       Reflux  Genitourinary: Negative.   Musculoskeletal: Negative.   Neurological: Negative.   Psychiatric/Behavioral: Negative.    Per HPI unless specifically indicated above     Objective:    BP 127/77   Pulse (!) 58   Temp (!) 97.5 F (36.4 C) (Oral)   Wt 147 lb (66.7 kg)   LMP 11/22/2016 (Approximate)   SpO2 92%   BMI 23.73 kg/m   Wt Readings from Last 3 Encounters:  12/05/16 147 lb (66.7 kg)  11/28/16 147 lb 3 oz (66.8 kg)  10/28/16 148 lb 8 oz (67.4 kg)    Physical Exam  Constitutional: She is oriented to person, place, and time. She appears well-developed and well-nourished. No distress.  HENT:  Head: Atraumatic.  Right Ear: External ear  normal.  Left Ear: External ear normal.  Nose: Nose normal.  Significant erythema, papules, and white coating diffusely across tongue and oropharynx  Cardiovascular: Normal rate and normal heart sounds.   Pulmonary/Chest: Effort normal. No respiratory distress. She has wheezes (significant, diffuse).  Musculoskeletal: Normal range of motion.  Neurological: She is alert and oriented to person, place, and time.  Skin: Skin is warm and dry.  No active lesions on mouth  Psychiatric: She has a normal mood and affect. Her behavior is normal.  Nursing note and vitals reviewed.     Assessment & Plan:   Problem List Items Addressed This Visit      Respiratory   COPD (chronic obstructive pulmonary disease) (HCC)    No improvement after first course of levaquin and prednisone. Neb treatment today with  some relief. Will start doxycycline and second prednisone taper. Continue albuterol and symbicort use. Return precautions given. F/u next week for lung check      Relevant Medications   magic mouthwash w/lidocaine SOLN   predniSONE (DELTASONE) 10 MG tablet   albuterol (PROVENTIL) (2.5 MG/3ML) 0.083% nebulizer solution 2.5 mg (Completed)     Digestive   GERD (gastroesophageal reflux disease)    Suspect flare from all the prednisone - increase to 20 mg BID, supplement with zantac prn. Discussed taking morning dose on empty stomach       Relevant Medications   magic mouthwash w/lidocaine SOLN   omeprazole (PRILOSEC) 20 MG capsule    Other Visit Diagnoses    Oral thrush    -  Primary   Will treat with magic mouthwash, probiotics. Discussed rinsing mouth well after symbicort use.    Relevant Medications   magic mouthwash w/lidocaine SOLN   valACYclovir (VALTREX) 1000 MG tablet   HSV infection       Will treat with valtrex prn. Discussed dosing BID for several days at first sign of flare. F/u if no improvement or more frequent flares   Relevant Medications   magic mouthwash w/lidocaine SOLN   valACYclovir (VALTREX) 1000 MG tablet       Follow up plan: Return in about 4 days (around 12/09/2016) for Lung recheck.

## 2016-12-06 DIAGNOSIS — K219 Gastro-esophageal reflux disease without esophagitis: Secondary | ICD-10-CM | POA: Insufficient documentation

## 2016-12-06 NOTE — Assessment & Plan Note (Signed)
Suspect flare from all the prednisone - increase to 20 mg BID, supplement with zantac prn. Discussed taking morning dose on empty stomach

## 2016-12-06 NOTE — Assessment & Plan Note (Signed)
No improvement after first course of levaquin and prednisone. Neb treatment today with some relief. Will start doxycycline and second prednisone taper. Continue albuterol and symbicort use. Return precautions given. F/u next week for lung check

## 2016-12-08 ENCOUNTER — Telehealth: Payer: Self-pay

## 2016-12-08 NOTE — Telephone Encounter (Signed)
Error

## 2016-12-09 ENCOUNTER — Telehealth: Payer: Self-pay | Admitting: Family Medicine

## 2016-12-09 ENCOUNTER — Ambulatory Visit: Payer: Medicaid Other | Admitting: Family Medicine

## 2016-12-09 NOTE — Telephone Encounter (Signed)
FYI: Patient's lawyer may call in regards to patient's progress with her depression in regards to her court case.  Patient will also called referred psychiatry facility in regards to her appointment.   Pateint's lawyer information is: Dolphus Jenny [phone]

## 2016-12-09 NOTE — Telephone Encounter (Signed)
Patient will have to come in and sign a records release before we can discuss anything with them.

## 2016-12-10 NOTE — Telephone Encounter (Signed)
Called patient and left a VM to return call.

## 2016-12-11 ENCOUNTER — Ambulatory Visit (INDEPENDENT_AMBULATORY_CARE_PROVIDER_SITE_OTHER): Payer: Medicaid Other | Admitting: Family Medicine

## 2016-12-11 ENCOUNTER — Encounter: Payer: Self-pay | Admitting: Family Medicine

## 2016-12-11 VITALS — BP 114/62 | HR 55 | Temp 97.7°F | Ht 66.0 in | Wt 149.0 lb

## 2016-12-11 DIAGNOSIS — B379 Candidiasis, unspecified: Secondary | ICD-10-CM

## 2016-12-11 DIAGNOSIS — J441 Chronic obstructive pulmonary disease with (acute) exacerbation: Secondary | ICD-10-CM

## 2016-12-11 MED ORDER — FLUCONAZOLE 150 MG PO TABS: 150.0000 mg | ORAL_TABLET | Freq: Once | ORAL | 0 refills | Status: AC

## 2016-12-11 NOTE — Assessment & Plan Note (Signed)
Resolved. Lungs clear. Continue to monitor. Call with any concerns. Continue inhalers.

## 2016-12-11 NOTE — Progress Notes (Signed)
BP 114/62 (BP Location: Left Arm, Patient Position: Sitting, Cuff Size: Normal)   Pulse (!) 55   Temp 97.7 F (36.5 C) (Oral)   Ht 5\' 6"  (1.676 m)   Wt 149 lb (67.6 kg)   LMP 11/22/2016 (Approximate)   SpO2 97%   BMI 24.05 kg/m    Subjective:    Patient ID: Natalie Taylor, female    DOB: 1961-01-18, 56 y.o.   MRN: [id number]  HPI: Natalie Taylor is a 56 y.o. female  Chief Complaint  Patient presents with  . Lung Recheck   Breathing is doing much much better. She feels back to herself. She notes that she got thrush after 1st course of prednisone. She and her husband passed this back and forth through oral sex. They both have genital yeast infections now. She would like to have this treated. She is otherwise feeling well with no other concerns. She notes that she is seeing Psychiatry 9/27 at 11:20  Relevant past medical, surgical, family and social history reviewed and updated as indicated. Interim medical history since our last visit reviewed. Allergies and medications reviewed and updated.  Review of Systems  Constitutional: Negative.   Respiratory: Negative.   Cardiovascular: Negative.   Genitourinary: Positive for vaginal discharge. Negative for decreased urine volume, difficulty urinating, dyspareunia, dysuria, enuresis, flank pain, frequency, genital sores, hematuria, menstrual problem, pelvic pain, urgency, vaginal bleeding and vaginal pain.  Skin: Positive for rash. Negative for color change, pallor and wound.  Psychiatric/Behavioral: Negative.     Per HPI unless specifically indicated above     Objective:    BP 114/62 (BP Location: Left Arm, Patient Position: Sitting, Cuff Size: Normal)   Pulse (!) 55   Temp 97.7 F (36.5 C) (Oral)   Ht 5\' 6"  (1.676 m)   Wt 149 lb (67.6 kg)   LMP 11/22/2016 (Approximate)   SpO2 97%   BMI 24.05 kg/m   Wt Readings from Last 3 Encounters:  12/11/16 149 lb (67.6 kg)  12/05/16 147 lb (66.7 kg)  11/28/16 147 lb 3 oz (66.8 kg)     Physical Exam  Constitutional: She is oriented to person, place, and time. She appears well-developed and well-nourished. No distress.  HENT:  Head: Normocephalic and atraumatic.  Right Ear: Hearing normal.  Left Ear: Hearing normal.  Nose: Nose normal.  Eyes: Conjunctivae and lids are normal. Right eye exhibits no discharge. Left eye exhibits no discharge. No scleral icterus.  Cardiovascular: Normal rate, regular rhythm, normal heart sounds and intact distal pulses.  Exam reveals no gallop and no friction rub.   No murmur heard. Pulmonary/Chest: Effort normal and breath sounds normal. No respiratory distress. She has no wheezes. She has no rales. She exhibits no tenderness.  Musculoskeletal: Normal range of motion.  Neurological: She is alert and oriented to person, place, and time.  Skin: Skin is warm, dry and intact. No rash noted. She is not diaphoretic. No erythema. No pallor.  Psychiatric: She has a normal mood and affect. Her speech is normal and behavior is normal. Judgment and thought content normal. Cognition and memory are normal.  Nursing note and vitals reviewed.   Results for orders placed or performed during the hospital encounter of 11/03/16  NM Myocar Multi W/Spect W/Wall Motion / EF  Result Value Ref Range   Rest HR 49 bpm   Rest BP 117/64 mmHg   Exercise duration (sec) 4 sec   Percent HR 48 %   Exercise duration (min) 1  min   Estimated workload 1.0 METS   Peak HR 72 BPM   Peak BP  mmHg   MPHR 166 bpm   SSS 15    SRS 9    SDS 6    TID 1.00    LV sys vol 50 mL   LV dias vol 89 46 - 106 mL   Phase 1 name PREINFSN    Stage 1 Name SUPINE    Stage 1 Time 00:00:57    Stage 1 Speed 0.0 mph   Stage 1 Grade 0.0 %   Stage 1 HR 51 bpm   Phase 2 Name Infusion    Stage 2 Name DOSE 1    Stage 2 Attribute Baseline    Stage 2 Time 00:00:01    Stage 2 Speed 0.0 mph   Stage 2 Grade 0.0 %   Stage 2 HR 51 bpm   Phase 3 Name Infusion    Stage 3 Name DOSE 1     Stage 3 Attribute Peak    Stage 3 Time 00:01:05    Stage 3 Speed 0.0 mph   Stage 3 Grade 0.0 %   Stage 3 HR 72 bpm   Phase 4 Name POSTINFSN    Stage 4 Attribute Recovery    Stage 4 Time 00:00:59    Stage 4 Speed 0.0 mph   Stage 4 Grade 0.0 %   Stage 4 HR 79 bpm   Phase 5 Name POSTINFSN    Stage 5 Time 00:04:00    Stage 5 Speed 0.0 mph   Stage 5 Grade 0.0 %   Stage 5 HR 73 bpm   Stage 5 SBP 122 mmHg   Stage 5 DBP 59 mmHg   Percent of predicted max HR 43 %      Assessment & Plan:   Problem List Items Addressed This Visit      Respiratory   COPD (chronic obstructive pulmonary disease) (HCC) - Primary    Resolved. Lungs clear. Continue to monitor. Call with any concerns. Continue inhalers.        Other Visit Diagnoses    Yeast infection       Will treat with diflucan. Advised her to have her husband treated before engaging in sexual activity.   Relevant Medications   fluconazole (DIFLUCAN) 150 MG tablet       Follow up plan: Return 2 weeks, for regular mood follow up.

## 2016-12-15 NOTE — Telephone Encounter (Signed)
Saw that patient had came in to fill out paperwork. Patient will need to come in to fill out DPR as well on her appointment 12/25/2016.

## 2016-12-18 ENCOUNTER — Ambulatory Visit: Payer: Medicaid Other | Admitting: Family Medicine

## 2016-12-25 ENCOUNTER — Ambulatory Visit: Payer: Self-pay | Admitting: Family Medicine

## 2017-01-16 ENCOUNTER — Other Ambulatory Visit: Payer: Self-pay | Admitting: Family Medicine

## 2017-01-20 ENCOUNTER — Other Ambulatory Visit: Payer: Self-pay | Admitting: Family Medicine

## 2017-01-20 NOTE — Telephone Encounter (Signed)
Needs appointment

## 2017-01-20 NOTE — Telephone Encounter (Signed)
Called patient, no answer, unable to leave a message.

## 2017-01-23 NOTE — Telephone Encounter (Signed)
Called patient, no answer, left message for patient to return my call.  

## 2017-01-26 NOTE — Telephone Encounter (Signed)
Unable to reach patient.

## 2017-02-23 ENCOUNTER — Ambulatory Visit: Payer: Medicaid Other | Admitting: Urology

## 2017-02-23 ENCOUNTER — Encounter: Payer: Self-pay | Admitting: Urology

## 2017-02-26 ENCOUNTER — Ambulatory Visit: Payer: Medicaid Other | Admitting: Family Medicine

## 2017-03-20 ENCOUNTER — Other Ambulatory Visit: Payer: Self-pay | Admitting: Family Medicine

## 2017-03-23 ENCOUNTER — Other Ambulatory Visit: Payer: Self-pay | Admitting: Family Medicine

## 2017-03-25 ENCOUNTER — Other Ambulatory Visit: Payer: Self-pay | Admitting: Family Medicine

## 2017-03-25 NOTE — Telephone Encounter (Signed)
Controlled substance 

## 2017-04-17 ENCOUNTER — Telehealth: Payer: Self-pay

## 2017-04-17 DIAGNOSIS — Z599 Problem related to housing and economic circumstances, unspecified: Secondary | ICD-10-CM

## 2017-04-17 DIAGNOSIS — Z598 Other problems related to housing and economic circumstances: Secondary | ICD-10-CM

## 2017-04-17 NOTE — Telephone Encounter (Signed)
Patient called stating she hasnt been able to come to her appointments with Dr.Johnson due to financial issues. Patient states she was evicted from her home and is staying in a hotel currently. She cannot afford any of her medications. She does have transportation help now. Informed her to keep her appt with Dr.Johnson and a referral will be placed to connected care for assistance.

## 2017-04-22 ENCOUNTER — Other Ambulatory Visit: Payer: Self-pay | Admitting: Family Medicine

## 2017-04-24 ENCOUNTER — Encounter: Payer: Self-pay | Admitting: Family Medicine

## 2017-04-24 ENCOUNTER — Ambulatory Visit (INDEPENDENT_AMBULATORY_CARE_PROVIDER_SITE_OTHER): Payer: Medicaid Other | Admitting: Family Medicine

## 2017-04-24 VITALS — BP 181/103 | HR 67 | Temp 97.8°F | Wt 150.5 lb

## 2017-04-24 DIAGNOSIS — Z79899 Other long term (current) drug therapy: Secondary | ICD-10-CM | POA: Diagnosis not present

## 2017-04-24 DIAGNOSIS — N390 Urinary tract infection, site not specified: Secondary | ICD-10-CM | POA: Diagnosis not present

## 2017-04-24 DIAGNOSIS — F332 Major depressive disorder, recurrent severe without psychotic features: Secondary | ICD-10-CM | POA: Diagnosis not present

## 2017-04-24 DIAGNOSIS — N3281 Overactive bladder: Secondary | ICD-10-CM | POA: Diagnosis not present

## 2017-04-24 DIAGNOSIS — E782 Mixed hyperlipidemia: Secondary | ICD-10-CM

## 2017-04-24 DIAGNOSIS — M2540 Effusion, unspecified joint: Secondary | ICD-10-CM

## 2017-04-24 DIAGNOSIS — K219 Gastro-esophageal reflux disease without esophagitis: Secondary | ICD-10-CM | POA: Diagnosis not present

## 2017-04-24 DIAGNOSIS — J42 Unspecified chronic bronchitis: Secondary | ICD-10-CM | POA: Diagnosis not present

## 2017-04-24 DIAGNOSIS — R8281 Pyuria: Secondary | ICD-10-CM

## 2017-04-24 DIAGNOSIS — I129 Hypertensive chronic kidney disease with stage 1 through stage 4 chronic kidney disease, or unspecified chronic kidney disease: Secondary | ICD-10-CM

## 2017-04-24 MED ORDER — ATORVASTATIN CALCIUM 80 MG PO TABS: 80.0000 mg | ORAL_TABLET | Freq: Every day | ORAL | 3 refills | Status: DC

## 2017-04-24 MED ORDER — CYCLOBENZAPRINE HCL 10 MG PO TABS: 10.0000 mg | ORAL_TABLET | Freq: Three times a day (TID) | ORAL | 1 refills | Status: DC | PRN

## 2017-04-24 MED ORDER — LISINOPRIL 10 MG PO TABS: 10.0000 mg | ORAL_TABLET | Freq: Every day | ORAL | 3 refills | Status: DC

## 2017-04-24 MED ORDER — FLUOXETINE HCL 60 MG PO TABS: 60.0000 mg | ORAL_TABLET | Freq: Every day | ORAL | 1 refills | Status: DC

## 2017-04-24 MED ORDER — BUPROPION HCL ER (SR) 200 MG PO TB12: ORAL_TABLET | ORAL | 1 refills | Status: DC

## 2017-04-24 MED ORDER — OMEPRAZOLE 20 MG PO CPDR: 20.0000 mg | DELAYED_RELEASE_CAPSULE | Freq: Two times a day (BID) | ORAL | 1 refills | Status: DC

## 2017-04-24 MED ORDER — METOPROLOL TARTRATE 50 MG PO TABS: 25.0000 mg | ORAL_TABLET | Freq: Two times a day (BID) | ORAL | 3 refills | Status: DC

## 2017-04-24 MED ORDER — TICAGRELOR 90 MG PO TABS: 90.0000 mg | ORAL_TABLET | Freq: Two times a day (BID) | ORAL | 3 refills | Status: DC

## 2017-04-24 MED ORDER — TRAZODONE HCL 50 MG PO TABS: 50.0000 mg | ORAL_TABLET | Freq: Every evening | ORAL | 1 refills | Status: DC | PRN

## 2017-04-24 MED ORDER — HYDROCHLOROTHIAZIDE 25 MG PO TABS: 25.0000 mg | ORAL_TABLET | Freq: Every day | ORAL | 1 refills | Status: DC

## 2017-04-24 MED ORDER — VALACYCLOVIR HCL 1 G PO TABS: 1000.0000 mg | ORAL_TABLET | Freq: Two times a day (BID) | ORAL | 3 refills | Status: DC

## 2017-04-24 MED ORDER — MIRABEGRON ER 50 MG PO TB24: 50.0000 mg | ORAL_TABLET | Freq: Every day | ORAL | 1 refills | Status: DC

## 2017-04-24 MED ORDER — BUDESONIDE-FORMOTEROL FUMARATE 160-4.5 MCG/ACT IN AERO: 2.0000 | INHALATION_SPRAY | Freq: Two times a day (BID) | RESPIRATORY_TRACT | 12 refills | Status: DC

## 2017-04-24 NOTE — Progress Notes (Signed)
BP (!) 181/103 (BP Location: Left Arm, Patient Position: Sitting, Cuff Size: Normal)   Pulse 67   Temp 97.8 F (36.6 C)   Wt 150 lb 8 oz (68.3 kg)   SpO2 98%   BMI 24.29 kg/m    Subjective:    Patient ID: Natalie Taylor, female    DOB: 1960-10-22, 57 y.o.   MRN: [id number]  HPI: Natalie Taylor is a 57 y.o. female  Chief Complaint  Patient presents with  . Medication Refill    Patient would like a prescription for ibuprofen  . Hypertension    Patient has been out of all of her medication, she was having a really hard time finacially.   . Depression   Had been homeless for several months since the last time she was seen. She notes that she didn't have money, so she didn't get her prescriptions filled. She has not had any of them in over 3 months, possibly 4. She now has a place to live and is doing better. She is not feeling well. She has been having terrible headaches and notes that her joints have been aching terribly and swelling. She has been taking ibuprofen for this, but it really doesn't seem to help much.   She did not keep her appointment with psychiatry at [facility], and has now been discharged from the practice for non-compliance. She has not had any of her lorazepam in about 4 months. She has been having panic attacks and has not been feeling like herself at all. She has not seen her cardiologist. Has not seen anyone else. She needs to get back on all her medicine. No other concerns at this time.   DEPRESSION Mood status: uncontrolled Satisfied with current treatment?: no Symptom severity: severe  Duration of current treatment : off everything for like 4 months Side effects: no Medication compliance: poor compliance Psychotherapy/counseling: no  Previous psychiatric medications: wellbutrin, prozac, ativan Depressed mood: yes Anxious mood: yes Anhedonia: yes Significant weight loss or gain: no Insomnia: yes hard to fall asleep Fatigue: yes Feelings of worthlessness or  guilt: yes Impaired concentration/indecisiveness: yes Suicidal ideations: no Hopelessness: yes Crying spells: yes Depression screen [facility] 2/9 04/24/2017 10/28/2016 09/24/2016 09/02/2016 06/13/2016  Decreased Interest _0 Down, Depressed, Hopeless _1 PHQ - 2 Score _2 Altered sleeping _3 Tired, decreased energy _4 Change in appetite _5 Feeling bad or failure about yourself  _6 Trouble concentrating _7 Moving slowly or fidgety/restless _8 Suicidal thoughts _9 0 1  PHQ-9 Score _10 Difficult doing work/chores Very difficult - - - -   GAD 7 : Generalized Anxiety Score 04/24/2017 10/28/2016 07/29/2016 11/01/2015  Nervous, Anxious, on Edge _11 Control/stop worrying _12 Worry too much - different things _13 Trouble relaxing _14 Restless _15 Easily annoyed or irritable _16 Afraid - awful might happen _17 Total GAD 7 Score _18 Anxiety Difficulty Extremely difficult - Not difficult at all Very difficult   HYPERTENSION Hypertension status: uncontrolled  Satisfied with current treatment? no Duration of hypertension: chronic BP  monitoring frequency:  not checking BP medication side effects:  Not on anything Medication compliance: poor compliance Aspirin: no Recurrent headaches: yes Visual changes: yes Palpitations: no Dyspnea: no Chest pain: no Lower extremity edema: no Dizzy/lightheaded: no  Relevant past medical, surgical, family and social history reviewed and updated as indicated. Interim medical history since our last visit reviewed. Allergies and medications reviewed and updated.  Review of Systems  Constitutional: Negative.   Respiratory: Negative.   Cardiovascular: Negative.   Musculoskeletal: Positive for arthralgias, back pain, joint swelling and myalgias. Negative for gait problem, neck pain and neck stiffness.  Skin: Negative.   Neurological:  Negative.   Psychiatric/Behavioral: Positive for decreased concentration, dysphoric mood and sleep disturbance. Negative for agitation, behavioral problems, confusion, hallucinations, self-injury and suicidal ideas. The patient is nervous/anxious. The patient is not hyperactive.     Per HPI unless specifically indicated above     Objective:    BP (!) 181/103 (BP Location: Left Arm, Patient Position: Sitting, Cuff Size: Normal)   Pulse 67   Temp 97.8 F (36.6 C)   Wt 150 lb 8 oz (68.3 kg)   SpO2 98%   BMI 24.29 kg/m   Wt Readings from Last 3 Encounters:  04/24/17 150 lb 8 oz (68.3 kg)  12/11/16 149 lb (67.6 kg)  12/05/16 147 lb (66.7 kg)    Physical Exam  Constitutional: She is oriented to person, place, and time. She appears well-developed and well-nourished. No distress.  HENT:  Head: Normocephalic and atraumatic.  Right Ear: Hearing normal.  Left Ear: Hearing normal.  Nose: Nose normal.  Eyes: Conjunctivae and lids are normal. Right eye exhibits no discharge. Left eye exhibits no discharge. No scleral icterus.  Cardiovascular: Normal rate, regular rhythm, normal heart sounds and intact distal pulses. Exam reveals no gallop and no friction rub.  No murmur heard. Pulmonary/Chest: Effort normal and breath sounds normal. No respiratory distress. She has no wheezes. She has no rales. She exhibits no tenderness.  Musculoskeletal: Normal range of motion. She exhibits edema and tenderness. She exhibits no deformity.  Neurological: She is alert and oriented to person, place, and time.  Skin: Skin is warm, dry and intact. No rash noted. She is not diaphoretic. No erythema. No pallor.  Psychiatric: She has a normal mood and affect. Her speech is normal and behavior is normal. Judgment and thought content normal. Cognition and memory are normal.    Results for orders placed or performed in visit on 04/24/17  Microscopic Examination  Result Value Ref Range   WBC, UA 11-30 (A) 0 - 5  /hpf   RBC, UA 0-2 0 - 2 /hpf   Epithelial Cells (non renal) 0-10 0 - 10 /hpf   Renal Epithel, UA 0-10 (A) None seen /hpf   Bacteria, UA Moderate (A) None seen/Few  Urine Culture, Reflex  Result Value Ref Range   Urine Culture, Routine WILL FOLLOW   Microalbumin, Urine Waived  Result Value Ref Range   Microalb, Ur Waived 150 (H) 0 - 19 mg/L   Creatinine, Urine Waived 100 10 - 300 mg/dL   Microalb/Creat Ratio 30-300 (H) <30 mg/g  UA/M w/rflx Culture, Routine  Result Value Ref Range   Specific Gravity, UA 1.020 1.005 - 1.030   pH, UA 7.0 5.0 - 7.5   Color, UA Yellow Yellow   Appearance Ur Turbid (A) Clear   Leukocytes, UA 3+ (A) Negative   Protein, UA 1+ (A) Negative/Trace   Glucose, UA Negative Negative  Ketones, UA Negative Negative   RBC, UA Negative Negative   Bilirubin, UA Negative Negative   Urobilinogen, Ur 0.2 0.2 - 1.0 mg/dL   Nitrite, UA Negative Negative   Microscopic Examination See below:    Urinalysis Reflex Comment       Assessment & Plan:   Problem List Items Addressed This Visit      Respiratory   COPD (chronic obstructive pulmonary disease) ([place])    Will get her restarted on her symbicort. Lungs clear today. Rx given. Recheck 7-10 days.      Relevant Medications   budesonide-formoterol (SYMBICORT) 160-4.5 MCG/ACT inhaler   Other Relevant Orders   CBC with Differential/Platelet   Comprehensive metabolic panel   Lipid Panel w/o Chol/HDL Ratio   TSH     Digestive   GERD (gastroesophageal reflux disease)    Has been off her medicine for 4 months. Will restart. Recheck 7-10 days. Checking labs today. Await results.       Relevant Medications   omeprazole (PRILOSEC) 20 MG capsule   Other Relevant Orders   CBC with Differential/Platelet   Comprehensive metabolic panel   Lipid Panel w/o Chol/HDL Ratio   TSH     Genitourinary   Benign hypertensive renal disease - Primary    Has been off her medicine for 4 months. Will restart. Recheck 7-10 days.  Checking labs today. Await results.       Relevant Orders   CBC with Differential/Platelet   Comprehensive metabolic panel   Lipid Panel w/o Chol/HDL Ratio   Microalbumin, Urine Waived (Completed)   TSH   Overactive bladder    Has been off her medicine for 4 months. Will restart. Recheck 7-10 days. Checking labs today. Await results.       Relevant Orders   CBC with Differential/Platelet   Comprehensive metabolic panel   Lipid Panel w/o Chol/HDL Ratio   TSH   UA/M w/rflx Culture, Routine (Completed)     Other   HLD (hyperlipidemia)    Has been off her medicine for 4 months. Will restart. Recheck 7-10 days. Checking labs today. Await results.       Relevant Medications   atorvastatin (LIPITOR) 80 MG tablet   lisinopril (PRINIVIL,ZESTRIL) 10 MG tablet   metoprolol tartrate (LOPRESSOR) 50 MG tablet   hydrochlorothiazide (HYDRODIURIL) 25 MG tablet   Other Relevant Orders   CBC with Differential/Platelet   Comprehensive metabolic panel   Lipid Panel w/o Chol/HDL Ratio   TSH   Depression    Not under good control. Has been off her medicine for 4 months. Long discussions had with patient previously about her need to see psychiatry. Patient did not keep appointment with psychiatry. Has been off her lorazepam for 4 months. Will not get refill from this office. Will need to see [facility]. Information given today. Will get her back on her wellbutrin and her prozac- dosing information given in patient education. Call with any concerns.       Relevant Medications   buPROPion (WELLBUTRIN SR) 200 MG 12 hr tablet   FLUoxetine HCl 60 MG TABS   traZODone (DESYREL) 50 MG tablet   Other Relevant Orders   CBC with Differential/Platelet   Comprehensive metabolic panel   Lipid Panel w/o Chol/HDL Ratio   TSH   RESOLVED: Controlled substance agreement signed    Patient did not keep appointment with psychiatry. Has been off her lorazepam for 4 months. Will not get refill from this office. Will need  to see [facility]. Information  given today.        Other Visit Diagnoses    Joint swelling       Has never been tested for rheumatologic disease. Will check for them and tick diseases. Await results.    Relevant Orders   RA Qn+CCP(IgG/A)+SjoSSA+SjoSSB   Uric acid   Antinuclear Antib (ANA)   Lyme Ab/Western Blot Reflex   Rocky mtn spotted fvr abs pnl(IgG+IgM)   Babesia microti Antibody Panel   Ehrlichia Antibody Panel   Sed Rate (ESR)   CK (Creatine Kinase)       Follow up plan: Return 7-10 days, for recheck BP.

## 2017-04-24 NOTE — Assessment & Plan Note (Signed)
Has been off her medicine for 4 months. Will restart. Recheck 7-10 days. Checking labs today. Await results.

## 2017-04-24 NOTE — Assessment & Plan Note (Signed)
Has been off her medicine for 4 months. Will restart. Recheck 7-10 days. Checking labs today. Await results.  

## 2017-04-24 NOTE — Assessment & Plan Note (Signed)
Will get her restarted on her symbicort. Lungs clear today. Rx given. Recheck 7-10 days.

## 2017-04-24 NOTE — Assessment & Plan Note (Signed)
Not under good control. Has been off her medicine for 4 months. Long discussions had with patient previously about her need to see psychiatry. Patient did not keep appointment with psychiatry. Has been off her lorazepam for 4 months. Will not get refill from this office. Will need to see [facility]. Information given today. Will get her back on her wellbutrin and her prozac- dosing information given in patient education. Call with any concerns.

## 2017-04-24 NOTE — Assessment & Plan Note (Signed)
Patient did not keep appointment with psychiatry. Has been off her lorazepam for 4 months. Will not get refill from this office. Will need to see [facility]. Information given today.

## 2017-04-24 NOTE — Patient Instructions (Addendum)
Buproprion- take 1 tablet per day for 2 weeks, then go up to 1 tab 2x a day  Fluoxetine- 1/4 for 1 week, then 1/2 tab for 1 week, whole tab for 1 week  [facility]- call them to get an appointment. Try to get in early next week [address], [place], [place] [id number] [phone]

## 2017-04-27 ENCOUNTER — Telehealth: Payer: Self-pay | Admitting: Family Medicine

## 2017-04-27 DIAGNOSIS — R768 Other specified abnormal immunological findings in serum: Secondary | ICD-10-CM

## 2017-04-27 DIAGNOSIS — M2550 Pain in unspecified joint: Secondary | ICD-10-CM

## 2017-04-27 DIAGNOSIS — R7689 Other specified abnormal immunological findings in serum: Secondary | ICD-10-CM

## 2017-04-27 MED ORDER — NITROFURANTOIN MONOHYD MACRO 100 MG PO CAPS: 100.0000 mg | ORAL_CAPSULE | Freq: Two times a day (BID) | ORAL | 0 refills | Status: DC

## 2017-04-27 NOTE — Telephone Encounter (Signed)
Patient notified

## 2017-04-27 NOTE — Telephone Encounter (Signed)
Please let patient know that her urine grew out an infection- so I've sent an antibiotic to her pharmacy. Her electrolytes, blood count and cholesterol look good, but some of the markers of inflammation in her body were elevated, so I'm going to have her see the rheumatologist. Referral generated today.

## 2017-04-28 ENCOUNTER — Telehealth: Payer: Self-pay | Admitting: Family Medicine

## 2017-04-28 LAB — UA/M W/RFLX CULTURE, ROUTINE
Bilirubin, UA: NEGATIVE
Glucose, UA: NEGATIVE
Ketones, UA: NEGATIVE
Nitrite, UA: NEGATIVE
RBC, UA: NEGATIVE
Specific Gravity, UA: 1.02 (ref 1.005–1.030)
Urobilinogen, Ur: 0.2 mg/dL (ref 0.2–1.0)
pH, UA: 7 (ref 5.0–7.5)

## 2017-04-28 LAB — MICROSCOPIC EXAMINATION

## 2017-04-28 LAB — MICROALBUMIN, URINE WAIVED
Creatinine, Urine Waived: 100 mg/dL (ref 10–300)
Microalb, Ur Waived: 150 mg/L — ABNORMAL HIGH (ref 0–19)

## 2017-04-28 LAB — URINE CULTURE, REFLEX

## 2017-04-28 MED ORDER — SULFAMETHOXAZOLE-TRIMETHOPRIM 800-160 MG PO TABS: 1.0000 | ORAL_TABLET | Freq: Two times a day (BID) | ORAL | 0 refills | Status: DC

## 2017-04-28 NOTE — Telephone Encounter (Signed)
Routing this to [place] to see if there is anything she can do to help with this.

## 2017-04-28 NOTE — Telephone Encounter (Signed)
Please let her know that the antibiotic I sent for her previously is not going to work for the bacteria she has in her urine. I've sent a new one over and she should stop the first one. Thanks!

## 2017-04-28 NOTE — Telephone Encounter (Signed)
[facility], [facility] not [facility] that far out. Unsure of any other options.

## 2017-04-28 NOTE — Telephone Encounter (Signed)
Patient notified, patient had not picked up the first prescription, called pharmacy and cancelled the first prescription. Patient states that she can not pick up the prescription until she comes back to the doctor on the 15th.

## 2017-04-28 NOTE — Telephone Encounter (Signed)
She needs to take that before a week- can we see if there's any delivery or anything like that?

## 2017-04-29 LAB — RA QN+CCP(IGG/A)+SJOSSA+SJOSSB
Cyclic Citrullin Peptide Ab: 6 units (ref 0–19)
ENA SSA (RO) Ab: 1.1 AI — ABNORMAL HIGH (ref 0.0–0.9)
ENA SSB (LA) Ab: <0.2 AI (ref 0.0–0.9)
Rhuematoid fact SerPl-aCnc: 14.7 IU/mL — ABNORMAL HIGH (ref 0.0–13.9)

## 2017-04-29 LAB — LIPID PANEL W/O CHOL/HDL RATIO
Cholesterol, Total: 194 mg/dL (ref 100–199)
HDL: 54 mg/dL (ref >39)
LDL Calculated: 127 mg/dL — ABNORMAL HIGH (ref 0–99)
Triglycerides: 67 mg/dL (ref 0–149)
VLDL Cholesterol Cal: 13 mg/dL (ref 5–40)

## 2017-04-29 LAB — CK: Total CK: 34 U/L (ref 24–173)

## 2017-04-29 LAB — LYME, WESTERN BLOT, SERUM (REFLEXED)
IgG P18 Ab.: ABSENT
IgG P28 Ab.: ABSENT
IgG P30 Ab.: ABSENT
IgG P39 Ab.: ABSENT
IgG P41 Ab.: ABSENT
IgG P45 Ab.: ABSENT
IgG P58 Ab.: ABSENT
IgG P66 Ab.: ABSENT
IgM P23 Ab.: ABSENT
IgM P39 Ab.: ABSENT
IgM P41 Ab.: ABSENT
Lyme IgG Wb: NEGATIVE
Lyme IgM Wb: NEGATIVE

## 2017-04-29 LAB — COMPREHENSIVE METABOLIC PANEL
ALT: 4 IU/L (ref 0–32)
AST: 10 IU/L (ref 0–40)
Albumin/Globulin Ratio: 1.2 (ref 1.2–2.2)
Albumin: 3.8 g/dL (ref 3.5–5.5)
Alkaline Phosphatase: 109 IU/L (ref 39–117)
BUN/Creatinine Ratio: 9 (ref 9–23)
BUN: 9 mg/dL (ref 6–24)
Bilirubin Total: 0.3 mg/dL (ref 0.0–1.2)
CO2: 21 mmol/L (ref 20–29)
Calcium: 9.1 mg/dL (ref 8.7–10.2)
Chloride: 104 mmol/L (ref 96–106)
Creatinine, Ser: 0.95 mg/dL (ref 0.57–1.00)
GFR calc Af Amer: 79 mL/min/{1.73_m2} (ref >59)
GFR calc non Af Amer: 68 mL/min/{1.73_m2} (ref >59)
Globulin, Total: 3.1 g/dL (ref 1.5–4.5)
Glucose: 86 mg/dL (ref 65–99)
Potassium: 4.7 mmol/L (ref 3.5–5.2)
Sodium: 141 mmol/L (ref 134–144)
Total Protein: 6.9 g/dL (ref 6.0–8.5)

## 2017-04-29 LAB — LYME AB/WESTERN BLOT REFLEX
LYME DISEASE AB, QUANT, IGM: <0.8 index (ref 0.00–0.79)
Lyme IgG/IgM Ab: 1.14 {ISR} — ABNORMAL HIGH (ref 0.00–0.90)

## 2017-04-29 LAB — CBC WITH DIFFERENTIAL/PLATELET
Basophils Absolute: 0 10*3/uL (ref 0.0–0.2)
Basos: 0 %
EOS (ABSOLUTE): 0.2 10*3/uL (ref 0.0–0.4)
Eos: 3 %
Hematocrit: 38 % (ref 34.0–46.6)
Hemoglobin: 12.5 g/dL (ref 11.1–15.9)
Immature Grans (Abs): 0 10*3/uL (ref 0.0–0.1)
Immature Granulocytes: 0 %
Lymphocytes Absolute: 2.1 10*3/uL (ref 0.7–3.1)
Lymphs: 27 %
MCH: 29.8 pg (ref 26.6–33.0)
MCHC: 32.9 g/dL (ref 31.5–35.7)
MCV: 91 fL (ref 79–97)
Monocytes Absolute: 0.7 10*3/uL (ref 0.1–0.9)
Monocytes: 9 %
Neutrophils Absolute: 4.5 10*3/uL (ref 1.4–7.0)
Neutrophils: 61 %
Platelets: 281 10*3/uL (ref 150–379)
RBC: 4.2 x10E6/uL (ref 3.77–5.28)
RDW: 13.5 % (ref 12.3–15.4)
WBC: 7.5 10*3/uL (ref 3.4–10.8)

## 2017-04-29 LAB — EHRLICHIA ANTIBODY PANEL
E. Chaffeensis (HME) IgM Titer: NEGATIVE
E.Chaffeensis (HME) IgG: NEGATIVE
HGE IgG Titer: NEGATIVE
HGE IgM Titer: NEGATIVE

## 2017-04-29 LAB — ROCKY MTN SPOTTED FVR ABS PNL(IGG+IGM)
RMSF IgG: UNDETERMINED
RMSF IgM: 1.42 index — ABNORMAL HIGH (ref 0.00–0.89)

## 2017-04-29 LAB — TSH: TSH: 2.79 u[IU]/mL (ref 0.450–4.500)

## 2017-04-29 LAB — URIC ACID: Uric Acid: 5.9 mg/dL (ref 2.5–7.1)

## 2017-04-29 LAB — SEDIMENTATION RATE: Sed Rate: 77 mm/hr — ABNORMAL HIGH (ref 0–40)

## 2017-04-29 LAB — ANA: Anti Nuclear Antibody(ANA): POSITIVE — AB

## 2017-04-29 LAB — BABESIA MICROTI ANTIBODY PANEL
Babesia microti IgG: <1:10 {titer}
Babesia microti IgM: <1:10 {titer}

## 2017-04-29 LAB — RMSF, IGG, IFA: RMSF, IGG, IFA: <1:64 {titer}

## 2017-05-05 ENCOUNTER — Ambulatory Visit: Payer: Medicaid Other | Admitting: Family Medicine

## 2017-05-05 ENCOUNTER — Telehealth: Payer: Self-pay | Admitting: Family Medicine

## 2017-05-05 NOTE — Progress Notes (Deleted)
There were no vitals taken for this visit.   Subjective:    Patient ID: Natalie Taylor, female    DOB: 1961/01/03, 57 y.o.   MRN: [id number]  HPI: Natalie Taylor is a 57 y.o. female  No chief complaint on file.  HYPERTENSION Hypertension status: {Blank single:19197::"controlled","uncontrolled","better","worse","exacerbated","stable"}  Satisfied with current treatment? {Blank single:19197::"yes","no"} Duration of hypertension: {Blank single:19197::"chronic","months","years"} BP monitoring frequency:  {Blank single:19197::"not checking","rarely","daily","weekly","monthly","a few times a day","a few times a week","a few times a month"} BP range:  BP medication side effects:  {Blank single:19197::"yes","no"} Medication compliance: {Blank single:19197::"excellent compliance","good compliance","fair compliance","poor compliance"} Previous BP meds:{Blank OVZCHYIF:02774::"JOIN","OMVEHMCNOB","SJGGEZMOQH/UTMLYYTKPT","WSFKCLEX","NTZGYFVCBS","WHQPRFFMBW/GYKZ","LDJTTSVXBL (bystolic)","carvedilol","chlorthalidone","clonidine","diltiazem","exforge HCT","HCTZ","irbesartan (avapro)","labetalol","lisinopril","lisinopril-HCTZ","losartan (cozaar)","methyldopa","nifedipine","olmesartan (benicar)","olmesartan-HCTZ","quinapril","ramipril","spironalactone","tekturna","valsartan","valsartan-HCTZ","verapamil"} Aspirin: {Blank single:19197::"yes","no"} Recurrent headaches: {Blank single:19197::"yes","no"} Visual changes: {Blank single:19197::"yes","no"} Palpitations: {Blank single:19197::"yes","no"} Dyspnea: {Blank single:19197::"yes","no"} Chest pain: {Blank single:19197::"yes","no"} Lower extremity edema: {Blank single:19197::"yes","no"} Dizzy/lightheaded: {Blank single:19197::"yes","no"}  URINARY SYMPTOMS  Dysuria: {Blank single:19197::"yes","no","burning"} Urinary frequency: {Blank single:19197::"yes","no"} Urgency: {Blank single:19197::"yes","no"} Small volume voids: {Blank  single:19197::"yes","no"} Symptom severity: {Blank single:19197::"yes","no"} Urinary incontinence: {Blank single:19197::"yes","no"} Foul odor: {Blank single:19197::"yes","no"} Hematuria: {Blank single:19197::"yes","no"} Abdominal pain: {Blank single:19197::"yes","no"} Back pain: {Blank single:19197::"yes","no"} Suprapubic pain/pressure: {Blank single:19197::"yes","no"} Flank pain: {Blank single:19197::"yes","no"} Fever:  {Blank multiple:19196::"yes","no","subjective","low grade"} Vomiting: {Blank single:19197::"yes","no"} Relief with cranberry juice: {Blank single:19197::"yes","no"} Relief with pyridium: {Blank single:19197::"yes","no"} Status: better/worse/stable Previous urinary tract infection: {Blank single:19197::"yes","no"} Recurrent urinary tract infection: {Blank single:19197::"yes","no"} Sexual activity: No sexually active/monogomous/practicing safe sex History of sexually transmitted disease: {Blank single:19197::"yes","no"} Penile discharge: {Blank single:19197::"yes","no"} Treatments attempted: {Blank multiple:19196::"none","antibiotics","pyridium","cranberry","increasing fluids"}   Relevant past medical, surgical, family and social history reviewed and updated as indicated. Interim medical history since our last visit reviewed. Allergies and medications reviewed and updated.  Review of Systems  Per HPI unless specifically indicated above     Objective:    There were no vitals taken for this visit.  Wt Readings from Last 3 Encounters:  04/24/17 150 lb 8 oz (68.3 kg)  12/11/16 149 lb (67.6 kg)  12/05/16 147 lb (66.7 kg)    Physical Exam  Results for orders placed or performed in visit on 04/24/17  Microscopic Examination  Result Value Ref Range   WBC, UA 11-30 (A) 0 - 5 /hpf   RBC, UA 0-2 0 - 2 /hpf   Epithelial Cells (non renal) 0-10 0 - 10 /hpf   Renal Epithel, UA 0-10 (A) None seen /hpf   Bacteria, UA Moderate (A) None seen/Few  Urine Culture, Reflex   Result Value Ref Range   Urine Culture, Routine Final report (A)    Organism ID, Bacteria Klebsiella pneumoniae (A)    Antimicrobial Susceptibility Comment   CBC with Differential/Platelet  Result Value Ref Range   WBC 7.5 3.4 - 10.8 x10E3/uL   RBC 4.20 3.77 - 5.28 x10E6/uL   Hemoglobin 12.5 11.1 - 15.9 g/dL   Hematocrit 38.0 34.0 - 46.6 %   MCV 91 79 - 97 fL   MCH 29.8 26.6 - 33.0 pg   MCHC 32.9 31.5 - 35.7 g/dL   RDW 13.5 12.3 - 15.4 %   Platelets 281 150 - 379 x10E3/uL   Neutrophils 61 Not Estab. %   Lymphs 27 Not Estab. %   Monocytes 9 Not Estab. %   Eos 3 Not Estab. %   Basos 0 Not Estab. %   Neutrophils Absolute 4.5 1.4 - 7.0 x10E3/uL   Lymphocytes Absolute 2.1 0.7 - 3.1 x10E3/uL   Monocytes Absolute 0.7 0.1 - 0.9 x10E3/uL   EOS (ABSOLUTE) 0.2 0.0 - 0.4 x10E3/uL   Basophils Absolute 0.0 0.0 - 0.2 x10E3/uL  Immature Granulocytes 0 Not Estab. %   Immature Grans (Abs) 0.0 0.0 - 0.1 x10E3/uL  Comprehensive metabolic panel  Result Value Ref Range   Glucose 86 65 - 99 mg/dL   BUN 9 6 - 24 mg/dL   Creatinine, Ser 0.95 0.57 - 1.00 mg/dL   GFR calc non Af Amer 68 >59 mL/min/1.73   GFR calc Af Amer 79 >59 mL/min/1.73   BUN/Creatinine Ratio 9 9 - 23   Sodium 141 134 - 144 mmol/L   Potassium 4.7 3.5 - 5.2 mmol/L   Chloride 104 96 - 106 mmol/L   CO2 21 20 - 29 mmol/L   Calcium 9.1 8.7 - 10.2 mg/dL   Total Protein 6.9 6.0 - 8.5 g/dL   Albumin 3.8 3.5 - 5.5 g/dL   Globulin, Total 3.1 1.5 - 4.5 g/dL   Albumin/Globulin Ratio 1.2 1.2 - 2.2   Bilirubin Total 0.3 0.0 - 1.2 mg/dL   Alkaline Phosphatase 109 39 - 117 IU/L   AST 10 0 - 40 IU/L   ALT 4 0 - 32 IU/L  Lipid Panel w/o Chol/HDL Ratio  Result Value Ref Range   Cholesterol, Total 194 100 - 199 mg/dL   Triglycerides 67 0 - 149 mg/dL   HDL 54 >39 mg/dL   VLDL Cholesterol Cal 13 5 - 40 mg/dL   LDL Calculated 127 (H) 0 - 99 mg/dL  Microalbumin, Urine Waived  Result Value Ref Range   Microalb, Ur Waived 150 (H) 0 - 19  mg/L   Creatinine, Urine Waived 100 10 - 300 mg/dL   Microalb/Creat Ratio 30-300 (H) <30 mg/g  TSH  Result Value Ref Range   TSH 2.790 0.450 - 4.500 uIU/mL  UA/M w/rflx Culture, Routine  Result Value Ref Range   Specific Gravity, UA 1.020 1.005 - 1.030   pH, UA 7.0 5.0 - 7.5   Color, UA Yellow Yellow   Appearance Ur Turbid (A) Clear   Leukocytes, UA 3+ (A) Negative   Protein, UA 1+ (A) Negative/Trace   Glucose, UA Negative Negative   Ketones, UA Negative Negative   RBC, UA Negative Negative   Bilirubin, UA Negative Negative   Urobilinogen, Ur 0.2 0.2 - 1.0 mg/dL   Nitrite, UA Negative Negative   Microscopic Examination See below:    Urinalysis Reflex Comment   RA Qn+CCP(IgG/A)+SjoSSA+SjoSSB  Result Value Ref Range   Rhuematoid fact SerPl-aCnc 14.7 (H) 0.0 - 13.9 IU/mL   ENA SSA (RO) Ab 1.1 (H) 0.0 - 0.9 AI   ENA SSB (LA) Ab <0.2 0.0 - 0.9 AI   Cyclic Citrullin Peptide Ab 6 0 - 19 units  Uric acid  Result Value Ref Range   Uric Acid 5.9 2.5 - 7.1 mg/dL  Antinuclear Antib (ANA)  Result Value Ref Range   Anit Nuclear Antibody(ANA) Positive (A) Negative  Lyme Ab/Western Blot Reflex  Result Value Ref Range   Lyme IgG/IgM Ab 1.14 (H) 0.00 - 0.90 ISR   LYME DISEASE AB, QUANT, IGM <0.80 0.00 - 0.79 index  Rocky mtn spotted fvr abs pnl(IgG+IgM)  Result Value Ref Range   RMSF IgG Equivocal Negative   RMSF IgM 1.42 (H) 0.00 - 0.89 index  Babesia microti Antibody Panel  Result Value Ref Range   Babesia microti IgM <1:10 Neg:<1:10   Babesia microti IgG <5:05 WPV:<9:48  Ehrlichia Antibody Panel  Result Value Ref Range   E.Chaffeensis (HME) IgG Negative Neg:<1:64   E. Chaffeensis (HME) IgM Titer Negative Neg:<1:20   HGE IgG  Titer Negative Neg:<1:64   HGE IgM Titer Negative Neg:<1:20  Sed Rate (ESR)  Result Value Ref Range   Sed Rate 77 (H) 0 - 40 mm/hr  CK (Creatine Kinase)  Result Value Ref Range   Total CK 34 24 - 173 U/L  RMSF, IgG, IFA  Result Value Ref Range   RMSF,  IGG, IFA <1:64 Neg <1:64  Lyme, Western Blot, Serum (reflexed)  Result Value Ref Range     IgG P93 Ab. Present (A)      IgG P66 Ab. Absent      IgG P58 Ab. Absent      IgG P45 Ab. Absent      IgG P41 Ab. Absent      IgG P39 Ab. Absent      IgG P30 Ab. Absent      IgG P28 Ab. Absent      IgG P23 Ab. Present (A)      IgG P18 Ab. Absent    Lyme IgG Wb Negative      IgM P41 Ab. Absent      IgM P39 Ab. Absent      IgM P23 Ab. Absent    Lyme IgM Wb Negative       Assessment & Plan:   Problem List Items Addressed This Visit      Genitourinary   Benign hypertensive renal disease - Primary    Other Visit Diagnoses    Acute cystitis with hematuria           Follow up plan: No Follow-up on file.

## 2017-05-05 NOTE — Telephone Encounter (Signed)
Natalie Taylor no-showed her appointment at 10AM. She has a lot of issues with transportation and was recently homeless and lost to follow up for 4 months and hadn't been taking any of her medication. Thanks!

## 2017-05-07 NOTE — Telephone Encounter (Signed)
Copied from CRM #[id number]. Topic: General - Other >> [phone] 17, 2019  1:56 PM [place], [place] A, LPN wrote: Reason for CRM: Called patient due to missed appointment on 05/06/2017, Please reschedule with Dr.Johnson at next available time slot for follow up.    Patient has had transportation issues in the past. Please contact Roshan Roback,LP[facility] at [facility] if patient needs transportation assistance.

## 2017-05-13 NOTE — Telephone Encounter (Signed)
2nd attempt to reach patient regarding no show on 05/06/17. LM for patient to call back

## 2017-09-28 ENCOUNTER — Ambulatory Visit (INDEPENDENT_AMBULATORY_CARE_PROVIDER_SITE_OTHER): Payer: Medicaid Other | Admitting: Family Medicine

## 2017-09-28 ENCOUNTER — Encounter: Payer: Self-pay | Admitting: Family Medicine

## 2017-09-28 ENCOUNTER — Ambulatory Visit
Admission: RE | Admit: 2017-09-28 | Discharge: 2017-09-28 | Disposition: A | Discharge status: Home / Self Care | Payer: Medicaid Other | Source: Ambulatory Visit | Attending: Family Medicine | Admitting: Family Medicine

## 2017-09-28 VITALS — BP 125/68 | HR 54 | Temp 98.0°F | Ht 66.0 in | Wt 150.8 lb

## 2017-09-28 DIAGNOSIS — R29818 Other symptoms and signs involving the nervous system: Secondary | ICD-10-CM | POA: Diagnosis not present

## 2017-09-28 DIAGNOSIS — F332 Major depressive disorder, recurrent severe without psychotic features: Secondary | ICD-10-CM | POA: Diagnosis not present

## 2017-09-28 DIAGNOSIS — N390 Urinary tract infection, site not specified: Secondary | ICD-10-CM

## 2017-09-28 DIAGNOSIS — I129 Hypertensive chronic kidney disease with stage 1 through stage 4 chronic kidney disease, or unspecified chronic kidney disease: Secondary | ICD-10-CM

## 2017-09-28 DIAGNOSIS — E782 Mixed hyperlipidemia: Secondary | ICD-10-CM

## 2017-09-28 DIAGNOSIS — J441 Chronic obstructive pulmonary disease with (acute) exacerbation: Secondary | ICD-10-CM | POA: Diagnosis not present

## 2017-09-28 DIAGNOSIS — N3281 Overactive bladder: Secondary | ICD-10-CM | POA: Diagnosis not present

## 2017-09-28 DIAGNOSIS — R8281 Pyuria: Secondary | ICD-10-CM

## 2017-09-28 DIAGNOSIS — K219 Gastro-esophageal reflux disease without esophagitis: Secondary | ICD-10-CM

## 2017-09-28 DIAGNOSIS — G319 Degenerative disease of nervous system, unspecified: Secondary | ICD-10-CM | POA: Insufficient documentation

## 2017-09-28 LAB — UA/M W/RFLX CULTURE, ROUTINE
Bilirubin, UA: NEGATIVE
Glucose, UA: NEGATIVE
Ketones, UA: NEGATIVE
Nitrite, UA: POSITIVE — AB
Specific Gravity, UA: 1.015 (ref 1.005–1.030)
Urobilinogen, Ur: 0.2 mg/dL (ref 0.2–1.0)
pH, UA: 7 (ref 5.0–7.5)

## 2017-09-28 LAB — BAYER DCA HB A1C WAIVED: HB A1C (BAYER DCA - WAIVED): 5.4 % (ref <7.0)

## 2017-09-28 LAB — MICROALBUMIN, URINE WAIVED
Creatinine, Urine Waived: 200 mg/dL (ref 10–300)
Microalb, Ur Waived: 80 mg/L — ABNORMAL HIGH (ref 0–19)

## 2017-09-28 LAB — MICROSCOPIC EXAMINATION: RBC, UA: NONE SEEN /hpf (ref 0–2)

## 2017-09-28 MED ORDER — METOPROLOL TARTRATE 50 MG PO TABS: 25.0000 mg | ORAL_TABLET | Freq: Two times a day (BID) | ORAL | 3 refills | Status: DC

## 2017-09-28 MED ORDER — BUPROPION HCL ER (SR) 200 MG PO TB12: ORAL_TABLET | ORAL | 1 refills | Status: DC

## 2017-09-28 MED ORDER — AZITHROMYCIN 250 MG PO TABS: ORAL_TABLET | ORAL | 0 refills | Status: DC

## 2017-09-28 MED ORDER — LISINOPRIL 10 MG PO TABS: 10.0000 mg | ORAL_TABLET | Freq: Every day | ORAL | 3 refills | Status: DC

## 2017-09-28 MED ORDER — TRAZODONE HCL 50 MG PO TABS: 50.0000 mg | ORAL_TABLET | Freq: Every evening | ORAL | 1 refills | Status: DC | PRN

## 2017-09-28 MED ORDER — BUDESONIDE-FORMOTEROL FUMARATE 160-4.5 MCG/ACT IN AERO: 2.0000 | INHALATION_SPRAY | Freq: Two times a day (BID) | RESPIRATORY_TRACT | 12 refills | Status: DC

## 2017-09-28 MED ORDER — HYDROCHLOROTHIAZIDE 25 MG PO TABS: 25.0000 mg | ORAL_TABLET | Freq: Every day | ORAL | 1 refills | Status: DC

## 2017-09-28 MED ORDER — OMEPRAZOLE 20 MG PO CPDR: 20.0000 mg | DELAYED_RELEASE_CAPSULE | Freq: Two times a day (BID) | ORAL | 1 refills | Status: DC

## 2017-09-28 MED ORDER — ATORVASTATIN CALCIUM 80 MG PO TABS: 80.0000 mg | ORAL_TABLET | Freq: Every day | ORAL | 3 refills | Status: DC

## 2017-09-28 MED ORDER — FLUOXETINE HCL 60 MG PO TABS: 60.0000 mg | ORAL_TABLET | Freq: Every day | ORAL | 1 refills | Status: DC

## 2017-09-28 MED ORDER — ALBUTEROL SULFATE (2.5 MG/3ML) 0.083% IN NEBU
2.5000 mg | INHALATION_SOLUTION | Freq: Once | RESPIRATORY_TRACT | Status: AC
  Administered 2017-09-28: 2.5 mg via RESPIRATORY_TRACT

## 2017-09-28 MED ORDER — VALACYCLOVIR HCL 1 G PO TABS: 1000.0000 mg | ORAL_TABLET | Freq: Two times a day (BID) | ORAL | 3 refills | Status: DC

## 2017-09-28 MED ORDER — MIRABEGRON ER 50 MG PO TB24: 50.0000 mg | ORAL_TABLET | Freq: Every day | ORAL | 1 refills | Status: DC

## 2017-09-28 MED ORDER — PREDNISONE 50 MG PO TABS: 50.0000 mg | ORAL_TABLET | Freq: Every day | ORAL | 0 refills | Status: DC

## 2017-09-28 NOTE — Progress Notes (Signed)
BP 125/68 (BP Location: Left Arm, Patient Position: Sitting, Cuff Size: Normal)   Pulse (!) 54   Temp 98 F (36.7 C) (Oral)   Ht 5\' 6"  (1.676 m)   Wt 150 lb 12.8 oz (68.4 kg)   SpO2 98%   BMI 24.34 kg/m    Subjective:    Patient ID: Natalie Taylor, female    DOB: July 24, 1960, 57 y.o.   MRN: [id number]  HPI: [name] is a 57 y.o. female who presents today after being lost to follow up for 5 months.   Chief Complaint  Patient presents with  . Medication Management  . Follow-up   [name] has not been doing well. She has been falling at least 1x a week. She notes that her legs are feeling weak and numb. She has not been taking her medicine. She is anxious about what's going on.   HYPERTENSION / HYPERLIPIDEMIA- was off all her medicine for several months. Only just restarted them about 1 week ago Satisfied with current treatment? yes Duration of hypertension: chronic BP monitoring frequency: not checking BP medication side effects: no Past BP meds: lisinopril, metoprolol, HCTZ Duration of hyperlipidemia: chronic Cholesterol medication side effects: no Cholesterol supplements: none Past cholesterol medications: atorvastatin Medication compliance: poor compliance Aspirin: no Recent stressors: yes Recurrent headaches: yes Visual changes: yes Palpitations: no Dyspnea: yes Chest pain: no Lower extremity edema: no Dizzy/lightheaded: yes  DEPRESSION- had some issues with being able to afford her medicine. Has only been able to get back on her medicine  Mood status: uncontrolled Satisfied with current treatment?: no Symptom severity: moderate  Duration of current treatment : chronic Side effects: no Medication compliance: poor compliance Psychotherapy/counseling: no  Previous psychiatric medications: fluoxetine Depressed mood: yes Anxious mood: yes Anhedonia: no Significant weight loss or gain: no Insomnia: yes hard to fall asleep Fatigue: yes Feelings of  worthlessness or guilt: yes Impaired concentration/indecisiveness: yes Suicidal ideations: no Hopelessness: yes Crying spells: yes Depression screen [facility] 2/9 09/28/2017 04/24/2017 10/28/2016 09/24/2016 09/02/2016  Decreased Interest 3 2 2 2 2   Down, Depressed, Hopeless 2 2 3 3 3   PHQ - 2 Score 5 4 5 5 5   Altered sleeping 3 3 3 2 3   Tired, decreased energy 1 3 2 3 3   Change in appetite 1 2 2 3 2   Feeling bad or failure about yourself  1 2 2 3 3   Trouble concentrating 2 2 2 3 2   Moving slowly or fidgety/restless 3 3 2 1 2   Suicidal thoughts 1 1 1 1  0  PHQ-9 Score 17 20 19 21 20   Difficult doing work/chores - Very difficult - - -  Some recent data might be hidden   COPD COPD status: exacerbated Satisfied with current treatment?: no Oxygen use: no Dyspnea frequency: constant Cough frequency: several times a day Rescue inhaler frequency:  Several times a day Limitation of activity: yes Productive cough: yes Pneumovax: Up to Date Influenza: Up to Date   Relevant past medical, surgical, family and social history reviewed and updated as indicated. Interim medical history since our last visit reviewed. Allergies and medications reviewed and updated.  Review of Systems  Constitutional: Positive for fatigue. Negative for activity change, appetite change, chills, diaphoresis, fever and unexpected weight change.  HENT: Negative.   Eyes: Negative.   Respiratory: Negative.   Cardiovascular: Negative.   Musculoskeletal: Negative.   Skin: Negative.   Neurological: Positive for dizziness, weakness, light-headedness, numbness (hands and legs) and headaches. Negative for  tremors, seizures, syncope, facial asymmetry and speech difficulty.       Falling at least 1x a week,   Psychiatric/Behavioral: Positive for dysphoric mood and sleep disturbance. Negative for agitation, behavioral problems, confusion, decreased concentration, hallucinations, self-injury and suicidal ideas. The patient is  nervous/anxious. The patient is not hyperactive.     Per HPI unless specifically indicated above     Objective:    BP 125/68 (BP Location: Left Arm, Patient Position: Sitting, Cuff Size: Normal)   Pulse (!) 54   Temp 98 F (36.7 C) (Oral)   Ht 5\' 6"  (1.676 m)   Wt 150 lb 12.8 oz (68.4 kg)   SpO2 98%   BMI 24.34 kg/m   Wt Readings from Last 3 Encounters:  09/28/17 150 lb 12.8 oz (68.4 kg)  04/24/17 150 lb 8 oz (68.3 kg)  12/11/16 149 lb (67.6 kg)   No data found.  Physical Exam  Constitutional: She is oriented to person, place, and time. She appears well-developed and well-nourished. No distress.  HENT:  Head: Normocephalic and atraumatic.  Right Ear: Hearing normal.  Left Ear: Hearing normal.  Nose: Nose normal.  Eyes: Conjunctivae and lids are normal. Right eye exhibits no discharge. Left eye exhibits no discharge. No scleral icterus.  Cardiovascular: Normal rate, regular rhythm, normal heart sounds and intact distal pulses. Exam reveals no gallop and no friction rub.  No murmur heard. Pulmonary/Chest: Effort normal and breath sounds normal. No stridor. No respiratory distress. She has no wheezes. She has no rales. She exhibits no tenderness.  Musculoskeletal: Normal range of motion.  Neurological: She is alert and oriented to person, place, and time. She displays normal reflexes. A sensory deficit is present. No cranial nerve deficit. She exhibits normal muscle tone. Coordination abnormal.  Skin: Skin is warm, dry and intact. Capillary refill takes less than 2 seconds. No rash noted. She is not diaphoretic. No erythema. No pallor.  Psychiatric: She has a normal mood and affect. Her speech is normal and behavior is normal. Judgment and thought content normal. Cognition and memory are normal.  Nursing note and vitals reviewed.   Results for orders placed or performed in visit on 09/28/17  Microscopic Examination  Result Value Ref Range   WBC, UA 11-30 (A) 0 - 5 /hpf    RBC, UA None seen 0 - 2 /hpf   Epithelial Cells (non renal) 0-10 0 - 10 /hpf   Renal Epithel, UA 0-10 (A) None seen /hpf   Bacteria, UA Moderate (A) None seen/Few  CBC with Differential/Platelet  Result Value Ref Range   WBC 9.3 3.4 - 10.8 x10E3/uL   RBC 3.78 3.77 - 5.28 x10E6/uL   Hemoglobin 11.2 11.1 - 15.9 g/dL   Hematocrit 16.1 09.6 - 46.6 %   MCV 92 79 - 97 fL   MCH 29.6 26.6 - 33.0 pg   MCHC 32.2 31.5 - 35.7 g/dL   RDW 04.5 40.9 - 81.1 %   Platelets 218 150 - 450 x10E3/uL   Neutrophils 61 Not Estab. %   Lymphs 29 Not Estab. %   Monocytes 7 Not Estab. %   Eos 3 Not Estab. %   Basos 0 Not Estab. %   Neutrophils Absolute 5.7 1.4 - 7.0 x10E3/uL   Lymphocytes Absolute 2.7 0.7 - 3.1 x10E3/uL   Monocytes Absolute 0.7 0.1 - 0.9 x10E3/uL   EOS (ABSOLUTE) 0.3 0.0 - 0.4 x10E3/uL   Basophils Absolute 0.0 0.0 - 0.2 x10E3/uL   Immature Granulocytes 0 Not  Estab. %   Immature Grans (Abs) 0.0 0.0 - 0.1 x10E3/uL  Bayer DCA Hb A1c Waived  Result Value Ref Range   HB A1C (BAYER DCA - WAIVED) 5.4 <7.0 %  Comprehensive metabolic panel  Result Value Ref Range   Glucose 78 65 - 99 mg/dL   BUN 17 6 - 24 mg/dL   Creatinine, Ser 1.61 0.57 - 1.00 mg/dL   GFR calc non Af Amer 66 >59 mL/min/1.73   GFR calc Af Amer 76 >59 mL/min/1.73   BUN/Creatinine Ratio 18 9 - 23   Sodium 141 134 - 144 mmol/L   Potassium 3.7 3.5 - 5.2 mmol/L   Chloride 103 96 - 106 mmol/L   CO2 26 20 - 29 mmol/L   Calcium 8.5 (L) 8.7 - 10.2 mg/dL   Total Protein 6.4 6.0 - 8.5 g/dL   Albumin 3.6 3.5 - 5.5 g/dL   Globulin, Total 2.8 1.5 - 4.5 g/dL   Albumin/Globulin Ratio 1.3 1.2 - 2.2   Bilirubin Total <0.2 0.0 - 1.2 mg/dL   Alkaline Phosphatase 109 39 - 117 IU/L   AST 11 0 - 40 IU/L   ALT 7 0 - 32 IU/L  Lipid Panel w/o Chol/HDL Ratio  Result Value Ref Range   Cholesterol, Total 141 100 - 199 mg/dL   Triglycerides 88 0 - 149 mg/dL   HDL 40 >09 mg/dL   VLDL Cholesterol Cal 18 5 - 40 mg/dL   LDL Calculated 83 0 - 99  mg/dL  Microalbumin, Urine Waived  Result Value Ref Range   Microalb, Ur Waived 80 (H) 0 - 19 mg/L   Creatinine, Urine Waived 200 10 - 300 mg/dL   Microalb/Creat Ratio 30-300 (H) <30 mg/g  TSH  Result Value Ref Range   TSH 4.360 0.450 - 4.500 uIU/mL  UA/M w/rflx Culture, Routine  Result Value Ref Range   Specific Gravity, UA 1.015 1.005 - 1.030   pH, UA 7.0 5.0 - 7.5   Color, UA Yellow Yellow   Appearance Ur Turbid (A) Clear   Leukocytes, UA 1+ (A) Negative   Protein, UA Trace (A) Negative/Trace   Glucose, UA Negative Negative   Ketones, UA Negative Negative   RBC, UA Trace (A) Negative   Bilirubin, UA Negative Negative   Urobilinogen, Ur 0.2 0.2 - 1.0 mg/dL   Nitrite, UA Positive (A) Negative   Microscopic Examination See below:       Assessment & Plan:   Problem List Items Addressed This Visit      Respiratory   COPD (chronic obstructive pulmonary disease) (HCC)    Will treat with prednisone and azithromycin. Recheck 2 weeks.       Relevant Medications   albuterol (PROVENTIL) (2.5 MG/3ML) 0.083% nebulizer solution 2.5 mg (Completed)   predniSONE (DELTASONE) 50 MG tablet   azithromycin (ZITHROMAX) 250 MG tablet   budesonide-formoterol (SYMBICORT) 160-4.5 MCG/ACT inhaler     Digestive   GERD (gastroesophageal reflux disease)    Will restart her medicine. Call with any concerns. Continue to monitor.       Relevant Medications   omeprazole (PRILOSEC) 20 MG capsule   Other Relevant Orders   CBC with Differential/Platelet (Completed)   Bayer DCA Hb A1c Waived (Completed)   Comprehensive metabolic panel (Completed)   TSH (Completed)   UA/M w/rflx Culture, Routine (Completed)     Genitourinary   Benign hypertensive renal disease    Under good control- has been off her medicine for months until 2 weeks ago.  Checking labs. Continue current regimen. Continue to monitor.       Relevant Orders   CBC with Differential/Platelet (Completed)   Bayer DCA Hb A1c Waived  (Completed)   Comprehensive metabolic panel (Completed)   Microalbumin, Urine Waived (Completed)   TSH (Completed)   UA/M w/rflx Culture, Routine (Completed)   Overactive bladder    Restart myrbetric. Call with any concerns. Checking urine.       Relevant Orders   CBC with Differential/Platelet (Completed)   Bayer DCA Hb A1c Waived (Completed)   Comprehensive metabolic panel (Completed)   TSH (Completed)   UA/M w/rflx Culture, Routine (Completed)     Other   HLD (hyperlipidemia)    Rechecking labs today. Await results. Call with any concerns. Continue current regimen.       Relevant Medications   atorvastatin (LIPITOR) 80 MG tablet   hydrochlorothiazide (HYDRODIURIL) 25 MG tablet   lisinopril (PRINIVIL,ZESTRIL) 10 MG tablet   metoprolol tartrate (LOPRESSOR) 50 MG tablet   Other Relevant Orders   CBC with Differential/Platelet (Completed)   Bayer DCA Hb A1c Waived (Completed)   Comprehensive metabolic panel (Completed)   Lipid Panel w/o Chol/HDL Ratio (Completed)   TSH (Completed)   UA/M w/rflx Culture, Routine (Completed)   Depression    Not under good control. Only recently restarted her medicine. Recheck 1 month. Encouraged patient to see psychiatry.      Relevant Medications   buPROPion (WELLBUTRIN SR) 200 MG 12 hr tablet   FLUoxetine HCl 60 MG TABS   traZODone (DESYREL) 50 MG tablet   Other Relevant Orders   CBC with Differential/Platelet (Completed)   Bayer DCA Hb A1c Waived (Completed)   Comprehensive metabolic panel (Completed)   TSH (Completed)   UA/M w/rflx Culture, Routine (Completed)    Other Visit Diagnoses    Other symptoms and signs involving the nervous system    -  Primary   Has been off her medicine. Significant concern for stroke. To go for CT of her head now. If negative, will refer to neurology.   Relevant Orders   CT Head Wo Contrast (Completed)   Pyuria       Will check urine culture. Await results.    Relevant Orders   Urine Culture        Follow up plan: Return in about 2 weeks (around 10/12/2017) for follow up.

## 2017-09-29 ENCOUNTER — Telehealth: Payer: Self-pay | Admitting: Family Medicine

## 2017-09-29 LAB — COMPREHENSIVE METABOLIC PANEL
ALT: 7 IU/L (ref 0–32)
AST: 11 IU/L (ref 0–40)
Albumin/Globulin Ratio: 1.3 (ref 1.2–2.2)
Albumin: 3.6 g/dL (ref 3.5–5.5)
Alkaline Phosphatase: 109 IU/L (ref 39–117)
BUN/Creatinine Ratio: 18 (ref 9–23)
BUN: 17 mg/dL (ref 6–24)
Bilirubin Total: <0.2 mg/dL (ref 0.0–1.2)
CO2: 26 mmol/L (ref 20–29)
Calcium: 8.5 mg/dL — ABNORMAL LOW (ref 8.7–10.2)
Chloride: 103 mmol/L (ref 96–106)
Creatinine, Ser: 0.97 mg/dL (ref 0.57–1.00)
GFR calc Af Amer: 76 mL/min/{1.73_m2} (ref >59)
GFR calc non Af Amer: 66 mL/min/{1.73_m2} (ref >59)
Globulin, Total: 2.8 g/dL (ref 1.5–4.5)
Glucose: 78 mg/dL (ref 65–99)
Potassium: 3.7 mmol/L (ref 3.5–5.2)
Sodium: 141 mmol/L (ref 134–144)
Total Protein: 6.4 g/dL (ref 6.0–8.5)

## 2017-09-29 LAB — CBC WITH DIFFERENTIAL/PLATELET
Basophils Absolute: 0 10*3/uL (ref 0.0–0.2)
Basos: 0 %
EOS (ABSOLUTE): 0.3 10*3/uL (ref 0.0–0.4)
Eos: 3 %
Hematocrit: 34.8 % (ref 34.0–46.6)
Hemoglobin: 11.2 g/dL (ref 11.1–15.9)
Immature Grans (Abs): 0 10*3/uL (ref 0.0–0.1)
Immature Granulocytes: 0 %
Lymphocytes Absolute: 2.7 10*3/uL (ref 0.7–3.1)
Lymphs: 29 %
MCH: 29.6 pg (ref 26.6–33.0)
MCHC: 32.2 g/dL (ref 31.5–35.7)
MCV: 92 fL (ref 79–97)
Monocytes Absolute: 0.7 10*3/uL (ref 0.1–0.9)
Monocytes: 7 %
Neutrophils Absolute: 5.7 10*3/uL (ref 1.4–7.0)
Neutrophils: 61 %
Platelets: 218 10*3/uL (ref 150–450)
RBC: 3.78 x10E6/uL (ref 3.77–5.28)
RDW: 14.2 % (ref 12.3–15.4)
WBC: 9.3 10*3/uL (ref 3.4–10.8)

## 2017-09-29 LAB — LIPID PANEL W/O CHOL/HDL RATIO
Cholesterol, Total: 141 mg/dL (ref 100–199)
HDL: 40 mg/dL (ref >39)
LDL Calculated: 83 mg/dL (ref 0–99)
Triglycerides: 88 mg/dL (ref 0–149)
VLDL Cholesterol Cal: 18 mg/dL (ref 5–40)

## 2017-09-29 LAB — TSH: TSH: 4.36 u[IU]/mL (ref 0.450–4.500)

## 2017-09-29 NOTE — Telephone Encounter (Signed)
Let patient know that her CT was normal as were her labs. I'm going to send her neurology to try to figure out why she's been falling. Thanks!

## 2017-09-29 NOTE — Telephone Encounter (Signed)
See other TE from today.

## 2017-09-29 NOTE — Assessment & Plan Note (Signed)
Restart myrbetric. Call with any concerns. Checking urine.

## 2017-09-29 NOTE — Telephone Encounter (Signed)
Copied from CRM [id number]. Topic: Inquiry >> Sep 29, 2017  4:07 PM Raquel SarnaHayes, Teresa G wrote: Pt needing a call back with results from Monday imaging.

## 2017-09-29 NOTE — Assessment & Plan Note (Signed)
Will treat with prednisone and azithromycin. Recheck 2 weeks.  

## 2017-09-29 NOTE — Assessment & Plan Note (Signed)
Not under good control. Only recently restarted her medicine. Recheck 1 month. Encouraged patient to see psychiatry.

## 2017-09-29 NOTE — Assessment & Plan Note (Signed)
Will restart her medicine. Call with any concerns. Continue to monitor.

## 2017-09-29 NOTE — Assessment & Plan Note (Signed)
Rechecking labs today. Await results. Call with any concerns. Continue current regimen.  

## 2017-09-29 NOTE — Assessment & Plan Note (Signed)
Under good control- has been off her medicine for months until 2 weeks ago. Checking labs. Continue current regimen. Continue to monitor.

## 2017-09-30 LAB — URINE CULTURE

## 2017-10-19 ENCOUNTER — Encounter: Payer: Self-pay | Admitting: Family Medicine

## 2017-10-19 ENCOUNTER — Ambulatory Visit: Payer: Medicaid Other | Admitting: Family Medicine

## 2017-10-19 ENCOUNTER — Telehealth: Payer: Self-pay | Admitting: Family Medicine

## 2017-10-19 DIAGNOSIS — Z5181 Encounter for therapeutic drug level monitoring: Secondary | ICD-10-CM | POA: Diagnosis not present

## 2017-10-19 NOTE — Telephone Encounter (Signed)
Please send warning letter. Has no-showed 2x

## 2017-10-19 NOTE — Telephone Encounter (Signed)
Done

## 2017-10-21 DIAGNOSIS — Z5181 Encounter for therapeutic drug level monitoring: Secondary | ICD-10-CM | POA: Diagnosis not present

## 2017-10-26 DIAGNOSIS — Z5181 Encounter for therapeutic drug level monitoring: Secondary | ICD-10-CM | POA: Diagnosis not present

## 2017-10-28 DIAGNOSIS — Z5181 Encounter for therapeutic drug level monitoring: Secondary | ICD-10-CM | POA: Diagnosis not present

## 2017-11-02 DIAGNOSIS — Z5181 Encounter for therapeutic drug level monitoring: Secondary | ICD-10-CM | POA: Diagnosis not present

## 2017-11-04 DIAGNOSIS — Z5181 Encounter for therapeutic drug level monitoring: Secondary | ICD-10-CM | POA: Diagnosis not present

## 2017-11-09 DIAGNOSIS — Z5181 Encounter for therapeutic drug level monitoring: Secondary | ICD-10-CM | POA: Diagnosis not present

## 2017-11-11 DIAGNOSIS — Z5181 Encounter for therapeutic drug level monitoring: Secondary | ICD-10-CM | POA: Diagnosis not present

## 2017-11-16 ENCOUNTER — Telehealth: Payer: Self-pay

## 2017-11-16 MED ORDER — FLUOXETINE HCL 20 MG PO TABS: 60.0000 mg | ORAL_TABLET | Freq: Every day | ORAL | 1 refills | Status: DC

## 2017-11-16 NOTE — Telephone Encounter (Signed)
Can you send a new prescription for the fluoxetine capsules, tablets are not preferred.

## 2017-11-18 ENCOUNTER — Ambulatory Visit: Payer: Medicaid Other | Admitting: Family Medicine

## 2017-11-23 DIAGNOSIS — Z5181 Encounter for therapeutic drug level monitoring: Secondary | ICD-10-CM | POA: Diagnosis not present

## 2017-11-30 ENCOUNTER — Encounter: Payer: Self-pay | Admitting: Family Medicine

## 2017-11-30 ENCOUNTER — Ambulatory Visit: Payer: Medicaid Other | Admitting: Family Medicine

## 2017-11-30 VITALS — BP 102/66 | HR 54 | Temp 97.5°F | Wt 146.6 lb

## 2017-11-30 DIAGNOSIS — G8929 Other chronic pain: Secondary | ICD-10-CM | POA: Diagnosis not present

## 2017-11-30 DIAGNOSIS — M5441 Lumbago with sciatica, right side: Secondary | ICD-10-CM | POA: Diagnosis not present

## 2017-11-30 DIAGNOSIS — R1110 Vomiting, unspecified: Secondary | ICD-10-CM | POA: Diagnosis not present

## 2017-11-30 DIAGNOSIS — M5442 Lumbago with sciatica, left side: Secondary | ICD-10-CM | POA: Diagnosis not present

## 2017-11-30 MED ORDER — ONDANSETRON 4 MG PO TBDP: 4.0000 mg | ORAL_TABLET | Freq: Three times a day (TID) | ORAL | 3 refills | Status: DC | PRN

## 2017-11-30 NOTE — Patient Instructions (Signed)

## 2017-11-30 NOTE — Progress Notes (Signed)
BP 102/66 (BP Location: Left Arm, Patient Position: Sitting, Cuff Size: Normal)   Pulse (!) 54   Temp (!) 97.5 F (36.4 C)   Wt 146 lb 9 oz (66.5 kg)   SpO2 97%   BMI 23.66 kg/m    Subjective:    Patient ID: Natalie Taylor, female    DOB: March 24, 1961, 57 y.o.   MRN: [id number]  HPI: Natalie Taylor is a 57 y.o. female  Chief Complaint  Patient presents with  . Hip Pain    that travels down her leg    HIP PAIN Duration: couple of months Involved hip: both hips- starts in the L hip  Mechanism of injury: unknown Location: lateral and down into her thigh to her toes on her L, and same spots on the R Onset: sudden  Severity: severe  Quality: aching, gnawing Frequency: intermittent, 3-4x a day or more Radiation: yes Aggravating factors: walking, standing    Alleviating factors: sitting   Status: stable Treatments attempted: heating pad    Relief with NSAIDs?: no Weakness with weight bearing: yes Weakness with walking: yes Paresthesias / decreased sensation: yes Swelling: no Redness:no Fevers: no  Relevant past medical, surgical, family and social history reviewed and updated as indicated. Interim medical history since our last visit reviewed. Allergies and medications reviewed and updated.  Review of Systems  Constitutional: Negative.   Respiratory: Negative.   Cardiovascular: Negative.   Gastrointestinal: Positive for nausea and vomiting. Negative for abdominal distention, abdominal pain, anal bleeding, blood in stool, constipation, diarrhea and rectal pain.  Musculoskeletal: Positive for arthralgias and myalgias. Negative for back pain, gait problem, joint swelling, neck pain and neck stiffness.  Skin: Negative.   Psychiatric/Behavioral: Negative.     Per HPI unless specifically indicated above     Objective:    BP 102/66 (BP Location: Left Arm, Patient Position: Sitting, Cuff Size: Normal)   Pulse (!) 54   Temp (!) 97.5 F (36.4 C)   Wt 146 lb 9 oz (66.5  kg)   SpO2 97%   BMI 23.66 kg/m   Wt Readings from Last 3 Encounters:  11/30/17 146 lb 9 oz (66.5 kg)  09/28/17 150 lb 12.8 oz (68.4 kg)  04/24/17 150 lb 8 oz (68.3 kg)    Physical Exam  Constitutional: She is oriented to person, place, and time. She appears well-developed and well-nourished. No distress.  HENT:  Head: Normocephalic and atraumatic.  Right Ear: Hearing normal.  Left Ear: Hearing normal.  Nose: Nose normal.  Eyes: Conjunctivae and lids are normal. Right eye exhibits no discharge. Left eye exhibits no discharge. No scleral icterus.  Cardiovascular: Normal rate, regular rhythm, normal heart sounds and intact distal pulses. Exam reveals no gallop and no friction rub.  No murmur heard. Pulmonary/Chest: Effort normal and breath sounds normal. No stridor. No respiratory distress. She has no wheezes. She has no rales. She exhibits no tenderness.  Abdominal: Soft. Bowel sounds are normal. She exhibits no distension and no mass. There is no tenderness. There is no rebound and no guarding. No hernia.  Neurological: She is alert and oriented to person, place, and time.  Skin: Skin is warm, dry and intact. Capillary refill takes less than 2 seconds. No rash noted. She is not diaphoretic. No erythema. No pallor.  Psychiatric: She has a normal mood and affect. Her speech is normal and behavior is normal. Judgment and thought content normal. Cognition and memory are normal.  Nursing note and vitals reviewed. Back  Exam:    Inspection:  Normal spinal curvature.  No deformity, ecchymosis, erythema, or lesions     Palpation:     Midline spinal tenderness: no      Paralumbar tenderness: yes L>R     Parathoracic tenderness: no      Buttocks tenderness: no     Range of Motion:      Flexion: Fingers to Knees     Extension:Decreased     Lateral bending:Decreased    Rotation:Decreased    Neuro Exam:Lower extremity DTRs normal & symmetric.  Strength and sensation intact. Decreased sensation  on L leg compared to R.    Special Tests:      Straight leg raise:negative   Results for orders placed or performed in visit on 09/28/17  Urine Culture  Result Value Ref Range   Urine Culture, Routine Final report    Organism ID, Bacteria Comment   Microscopic Examination  Result Value Ref Range   WBC, UA 11-30 (A) 0 - 5 /hpf   RBC, UA None seen 0 - 2 /hpf   Epithelial Cells (non renal) 0-10 0 - 10 /hpf   Renal Epithel, UA 0-10 (A) None seen /hpf   Bacteria, UA Moderate (A) None seen/Few  CBC with Differential/Platelet  Result Value Ref Range   WBC 9.3 3.4 - 10.8 x10E3/uL   RBC 3.78 3.77 - 5.28 x10E6/uL   Hemoglobin 11.2 11.1 - 15.9 g/dL   Hematocrit 16.134.8 09.634.0 - 46.6 %   MCV 92 79 - 97 fL   MCH 29.6 26.6 - 33.0 pg   MCHC 32.2 31.5 - 35.7 g/dL   RDW 04.514.2 40.912.3 - 81.115.4 %   Platelets 218 150 - 450 x10E3/uL   Neutrophils 61 Not Estab. %   Lymphs 29 Not Estab. %   Monocytes 7 Not Estab. %   Eos 3 Not Estab. %   Basos 0 Not Estab. %   Neutrophils Absolute 5.7 1.4 - 7.0 x10E3/uL   Lymphocytes Absolute 2.7 0.7 - 3.1 x10E3/uL   Monocytes Absolute 0.7 0.1 - 0.9 x10E3/uL   EOS (ABSOLUTE) 0.3 0.0 - 0.4 x10E3/uL   Basophils Absolute 0.0 0.0 - 0.2 x10E3/uL   Immature Granulocytes 0 Not Estab. %   Immature Grans (Abs) 0.0 0.0 - 0.1 x10E3/uL  Bayer DCA Hb A1c Waived  Result Value Ref Range   HB A1C (BAYER DCA - WAIVED) 5.4 <7.0 %  Comprehensive metabolic panel  Result Value Ref Range   Glucose 78 65 - 99 mg/dL   BUN 17 6 - 24 mg/dL   Creatinine, Ser 9.140.97 0.57 - 1.00 mg/dL   GFR calc non Af Amer 66 >59 mL/min/1.73   GFR calc Af Amer 76 >59 mL/min/1.73   BUN/Creatinine Ratio 18 9 - 23   Sodium 141 134 - 144 mmol/L   Potassium 3.7 3.5 - 5.2 mmol/L   Chloride 103 96 - 106 mmol/L   CO2 26 20 - 29 mmol/L   Calcium 8.5 (L) 8.7 - 10.2 mg/dL   Total Protein 6.4 6.0 - 8.5 g/dL   Albumin 3.6 3.5 - 5.5 g/dL   Globulin, Total 2.8 1.5 - 4.5 g/dL   Albumin/Globulin Ratio 1.3 1.2 - 2.2    Bilirubin Total <0.2 0.0 - 1.2 mg/dL   Alkaline Phosphatase 109 39 - 117 IU/L   AST 11 0 - 40 IU/L   ALT 7 0 - 32 IU/L  Lipid Panel w/o Chol/HDL Ratio  Result Value Ref Range   Cholesterol, Total  141 100 - 199 mg/dL   Triglycerides 88 0 - 149 mg/dL   HDL 40 >40>39 mg/dL   VLDL Cholesterol Cal 18 5 - 40 mg/dL   LDL Calculated 83 0 - 99 mg/dL  Microalbumin, Urine Waived  Result Value Ref Range   Microalb, Ur Waived 80 (H) 0 - 19 mg/L   Creatinine, Urine Waived 200 10 - 300 mg/dL   Microalb/Creat Ratio 30-300 (H) <30 mg/g  TSH  Result Value Ref Range   TSH 4.360 0.450 - 4.500 uIU/mL  UA/M w/rflx Culture, Routine  Result Value Ref Range   Specific Gravity, UA 1.015 1.005 - 1.030   pH, UA 7.0 5.0 - 7.5   Color, UA Yellow Yellow   Appearance Ur Turbid (A) Clear   Leukocytes, UA 1+ (A) Negative   Protein, UA Trace (A) Negative/Trace   Glucose, UA Negative Negative   Ketones, UA Negative Negative   RBC, UA Trace (A) Negative   Bilirubin, UA Negative Negative   Urobilinogen, Ur 0.2 0.2 - 1.0 mg/dL   Nitrite, UA Positive (A) Negative   Microscopic Examination See below:       Assessment & Plan:   Problem List Items Addressed This Visit    None    Visit Diagnoses    Chronic bilateral low back pain with bilateral sciatica    -  Primary   Will obtain x-ray. Exercises given. Continue flexeril. Call with any concerns. Await results.    Relevant Orders   DG Lumbar Spine Complete   Vomiting, intractability of vomiting not specified, presence of nausea not specified, unspecified vomiting type       Already on BID omeprazole. Will get her back into see GI. Call with any concerns.        Follow up plan: Return in about 2 months (around 01/30/2018).

## 2017-12-01 ENCOUNTER — Telehealth: Payer: Self-pay | Admitting: Family Medicine

## 2017-12-01 DIAGNOSIS — K219 Gastro-esophageal reflux disease without esophagitis: Secondary | ICD-10-CM

## 2017-12-01 DIAGNOSIS — R1110 Vomiting, unspecified: Secondary | ICD-10-CM

## 2017-12-01 DIAGNOSIS — Z5181 Encounter for therapeutic drug level monitoring: Secondary | ICD-10-CM | POA: Diagnosis not present

## 2017-12-01 NOTE — Telephone Encounter (Signed)
Referral generated

## 2017-12-01 NOTE — Telephone Encounter (Signed)
-----   Message from Hyman Bibleiffany L Reel, CMA sent at 12/01/2017 11:30 AM EDT ----- GI not able to see where she has seen Dr.Wohl in the past, please place a referral ----- Message ----- From: Dorcas CarrowJohnson, Jesenya Bowditch P, DO Sent: 11/30/2017  11:04 AM EDT To: Gwenith Spitziffany L Reel, CMA  Can we please see about getting [place] rescheduled with Dr. Servando SnareWohl- she has not seen him in a couple of years- for nausea

## 2017-12-02 ENCOUNTER — Encounter: Payer: Self-pay | Admitting: Gastroenterology

## 2017-12-07 DIAGNOSIS — Z5181 Encounter for therapeutic drug level monitoring: Secondary | ICD-10-CM | POA: Diagnosis not present

## 2017-12-14 DIAGNOSIS — Z5181 Encounter for therapeutic drug level monitoring: Secondary | ICD-10-CM | POA: Diagnosis not present

## 2017-12-23 DIAGNOSIS — Z5181 Encounter for therapeutic drug level monitoring: Secondary | ICD-10-CM | POA: Diagnosis not present

## 2017-12-29 DIAGNOSIS — Z5181 Encounter for therapeutic drug level monitoring: Secondary | ICD-10-CM | POA: Diagnosis not present

## 2018-01-04 DIAGNOSIS — Z5181 Encounter for therapeutic drug level monitoring: Secondary | ICD-10-CM | POA: Diagnosis not present

## 2018-01-12 DIAGNOSIS — Z5181 Encounter for therapeutic drug level monitoring: Secondary | ICD-10-CM | POA: Diagnosis not present

## 2018-01-14 DIAGNOSIS — Z5181 Encounter for therapeutic drug level monitoring: Secondary | ICD-10-CM | POA: Diagnosis not present

## 2018-01-18 DIAGNOSIS — Z5181 Encounter for therapeutic drug level monitoring: Secondary | ICD-10-CM | POA: Diagnosis not present

## 2018-01-26 DIAGNOSIS — Z5181 Encounter for therapeutic drug level monitoring: Secondary | ICD-10-CM | POA: Diagnosis not present

## 2018-01-29 ENCOUNTER — Ambulatory Visit: Payer: Medicaid Other | Admitting: Family Medicine

## 2018-02-02 DIAGNOSIS — Z5181 Encounter for therapeutic drug level monitoring: Secondary | ICD-10-CM | POA: Diagnosis not present

## 2018-02-09 ENCOUNTER — Ambulatory Visit: Payer: Medicaid Other | Admitting: Family Medicine

## 2018-02-09 DIAGNOSIS — Z5181 Encounter for therapeutic drug level monitoring: Secondary | ICD-10-CM | POA: Diagnosis not present

## 2018-02-17 DIAGNOSIS — Z5181 Encounter for therapeutic drug level monitoring: Secondary | ICD-10-CM | POA: Diagnosis not present

## 2018-02-18 ENCOUNTER — Emergency Department: Payer: Medicaid Other

## 2018-02-18 ENCOUNTER — Other Ambulatory Visit: Payer: Self-pay

## 2018-02-18 ENCOUNTER — Inpatient Hospital Stay
Admission: EM | Admit: 2018-02-18 | Discharge: 2018-02-20 | DRG: 683 | Disposition: A | Discharge status: Home / Self Care | Payer: Medicaid Other | Attending: Internal Medicine | Admitting: Internal Medicine

## 2018-02-18 DIAGNOSIS — N390 Urinary tract infection, site not specified: Secondary | ICD-10-CM

## 2018-02-18 DIAGNOSIS — N200 Calculus of kidney: Secondary | ICD-10-CM | POA: Diagnosis not present

## 2018-02-18 DIAGNOSIS — K529 Noninfective gastroenteritis and colitis, unspecified: Secondary | ICD-10-CM | POA: Diagnosis not present

## 2018-02-18 DIAGNOSIS — N179 Acute kidney failure, unspecified: Principal | ICD-10-CM | POA: Diagnosis present

## 2018-02-18 DIAGNOSIS — R1030 Lower abdominal pain, unspecified: Secondary | ICD-10-CM | POA: Diagnosis not present

## 2018-02-18 DIAGNOSIS — Z7951 Long term (current) use of inhaled steroids: Secondary | ICD-10-CM

## 2018-02-18 DIAGNOSIS — Z79899 Other long term (current) drug therapy: Secondary | ICD-10-CM

## 2018-02-18 DIAGNOSIS — I10 Essential (primary) hypertension: Secondary | ICD-10-CM | POA: Diagnosis present

## 2018-02-18 DIAGNOSIS — K5909 Other constipation: Secondary | ICD-10-CM | POA: Diagnosis present

## 2018-02-18 DIAGNOSIS — E785 Hyperlipidemia, unspecified: Secondary | ICD-10-CM | POA: Diagnosis present

## 2018-02-18 DIAGNOSIS — K5900 Constipation, unspecified: Secondary | ICD-10-CM | POA: Diagnosis not present

## 2018-02-18 DIAGNOSIS — Z9049 Acquired absence of other specified parts of digestive tract: Secondary | ICD-10-CM

## 2018-02-18 DIAGNOSIS — E440 Moderate protein-calorie malnutrition: Secondary | ICD-10-CM

## 2018-02-18 DIAGNOSIS — I252 Old myocardial infarction: Secondary | ICD-10-CM

## 2018-02-18 DIAGNOSIS — I2510 Atherosclerotic heart disease of native coronary artery without angina pectoris: Secondary | ICD-10-CM | POA: Diagnosis present

## 2018-02-18 DIAGNOSIS — R1032 Left lower quadrant pain: Secondary | ICD-10-CM | POA: Diagnosis not present

## 2018-02-18 DIAGNOSIS — J449 Chronic obstructive pulmonary disease, unspecified: Secondary | ICD-10-CM | POA: Diagnosis present

## 2018-02-18 DIAGNOSIS — F17210 Nicotine dependence, cigarettes, uncomplicated: Secondary | ICD-10-CM | POA: Diagnosis present

## 2018-02-18 DIAGNOSIS — E860 Dehydration: Secondary | ICD-10-CM | POA: Diagnosis present

## 2018-02-18 DIAGNOSIS — K219 Gastro-esophageal reflux disease without esophagitis: Secondary | ICD-10-CM | POA: Diagnosis present

## 2018-02-18 DIAGNOSIS — G4730 Sleep apnea, unspecified: Secondary | ICD-10-CM | POA: Diagnosis present

## 2018-02-18 DIAGNOSIS — Z7902 Long term (current) use of antithrombotics/antiplatelets: Secondary | ICD-10-CM

## 2018-02-18 HISTORY — DX: Anxiety disorder, unspecified: F41.9

## 2018-02-18 HISTORY — DX: Depression, unspecified: F32.A

## 2018-02-18 HISTORY — DX: Major depressive disorder, single episode, unspecified: F32.9

## 2018-02-18 LAB — COMPREHENSIVE METABOLIC PANEL
ALT: 11 U/L (ref 0–44)
AST: 17 U/L (ref 15–41)
Albumin: 3.9 g/dL (ref 3.5–5.0)
Alkaline Phosphatase: 123 U/L (ref 38–126)
Anion gap: 6 (ref 5–15)
BUN: 36 mg/dL — ABNORMAL HIGH (ref 6–20)
CO2: 23 mmol/L (ref 22–32)
Calcium: 8.8 mg/dL — ABNORMAL LOW (ref 8.9–10.3)
Chloride: 107 mmol/L (ref 98–111)
Creatinine, Ser: 2.27 mg/dL — ABNORMAL HIGH (ref 0.44–1.00)
GFR calc Af Amer: 27 mL/min — ABNORMAL LOW (ref >60)
GFR calc non Af Amer: 23 mL/min — ABNORMAL LOW (ref >60)
Glucose, Bld: 127 mg/dL — ABNORMAL HIGH (ref 70–99)
Potassium: 4.1 mmol/L (ref 3.5–5.1)
Sodium: 136 mmol/L (ref 135–145)
Total Bilirubin: 0.7 mg/dL (ref 0.3–1.2)
Total Protein: 7.5 g/dL (ref 6.5–8.1)

## 2018-02-18 LAB — CBC
HCT: 38.6 % (ref 36.0–46.0)
Hemoglobin: 12.6 g/dL (ref 12.0–15.0)
MCH: 29.6 pg (ref 26.0–34.0)
MCHC: 32.6 g/dL (ref 30.0–36.0)
MCV: 90.8 fL (ref 80.0–100.0)
Platelets: 249 10*3/uL (ref 150–400)
RBC: 4.25 MIL/uL (ref 3.87–5.11)
RDW: 14.2 % (ref 11.5–15.5)
WBC: 12.6 10*3/uL — ABNORMAL HIGH (ref 4.0–10.5)
nRBC: 0 % (ref 0.0–0.2)

## 2018-02-18 LAB — URINALYSIS, COMPLETE (UACMP) WITH MICROSCOPIC
Bilirubin Urine: NEGATIVE
Glucose, UA: NEGATIVE mg/dL
Hgb urine dipstick: NEGATIVE
Ketones, ur: NEGATIVE mg/dL
Nitrite: POSITIVE — AB
Protein, ur: 30 mg/dL — AB
Specific Gravity, Urine: 1.011 (ref 1.005–1.030)
WBC, UA: >50 WBC/hpf — ABNORMAL HIGH (ref 0–5)
pH: 5 (ref 5.0–8.0)

## 2018-02-18 LAB — LIPASE, BLOOD: Lipase: 44 U/L (ref 11–51)

## 2018-02-18 LAB — PREGNANCY, URINE: Preg Test, Ur: NEGATIVE

## 2018-02-18 MED ORDER — MORPHINE SULFATE (PF) 4 MG/ML IV SOLN
4.0000 mg | Freq: Once | INTRAVENOUS | Status: AC
  Administered 2018-02-18: 4 mg via INTRAVENOUS
  Filled 2018-02-18 (×2): qty 1

## 2018-02-18 MED ORDER — LIDOCAINE VISCOUS HCL 2 % MT SOLN
15.0000 mL | Freq: Once | OROMUCOSAL | Status: AC
  Administered 2018-02-18: 15 mL via ORAL

## 2018-02-18 MED ORDER — IOPAMIDOL (ISOVUE-300) INJECTION 61%
15.0000 mL | INTRAVENOUS | Status: AC
  Administered 2018-02-18: 15 mL via ORAL

## 2018-02-18 MED ORDER — LIDOCAINE VISCOUS HCL 2 % MT SOLN
OROMUCOSAL | Status: AC
  Filled 2018-02-18: qty 15

## 2018-02-18 MED ORDER — SODIUM CHLORIDE 0.9 % IV SOLN
Freq: Once | INTRAVENOUS | Status: AC
  Administered 2018-02-18: 20:00:00 via INTRAVENOUS

## 2018-02-18 MED ORDER — ONDANSETRON HCL 4 MG/2ML IJ SOLN
4.0000 mg | Freq: Once | INTRAMUSCULAR | Status: AC
  Administered 2018-02-18: 4 mg via INTRAVENOUS
  Filled 2018-02-18: qty 2

## 2018-02-18 MED ORDER — HYDROMORPHONE HCL 1 MG/ML IJ SOLN
0.5000 mg | Freq: Once | INTRAMUSCULAR | Status: AC
  Administered 2018-02-18: 0.5 mg via INTRAVENOUS
  Filled 2018-02-18: qty 1

## 2018-02-18 MED ORDER — ALUM & MAG HYDROXIDE-SIMETH 200-200-20 MG/5ML PO SUSP
30.0000 mL | Freq: Once | ORAL | Status: AC
  Administered 2018-02-18: 30 mL via ORAL
  Filled 2018-02-18: qty 30

## 2018-02-18 MED ORDER — METRONIDAZOLE IN NACL 5-0.79 MG/ML-% IV SOLN
500.0000 mg | Freq: Once | INTRAVENOUS | Status: AC
  Administered 2018-02-18: 500 mg via INTRAVENOUS
  Filled 2018-02-18: qty 100

## 2018-02-18 MED ORDER — CIPROFLOXACIN IN D5W 400 MG/200ML IV SOLN
400.0000 mg | Freq: Once | INTRAVENOUS | Status: AC
  Administered 2018-02-19: 400 mg via INTRAVENOUS
  Filled 2018-02-18: qty 200

## 2018-02-18 MED ORDER — ONDANSETRON HCL 4 MG/2ML IJ SOLN
INTRAMUSCULAR | Status: AC
  Filled 2018-02-18: qty 2

## 2018-02-18 MED ORDER — ONDANSETRON HCL 4 MG/2ML IJ SOLN
4.0000 mg | Freq: Once | INTRAMUSCULAR | Status: AC
  Administered 2018-02-18: 4 mg via INTRAVENOUS

## 2018-02-18 NOTE — ED Provider Notes (Addendum)
[facility] Emergency Department Provider Note   ____________________________________________   First MD Initiated Contact with Patient 02/18/18 2001     (approximate)  I have reviewed the triage vital signs and the nursing notes.   HISTORY  Chief Complaint Abdominal Pain    HPI Natalie Taylor is a 57 y.o. female patient complains of severe left lower quadrant pain to me.  Is been going on for 2 days getting worse.  Sharp and stabbing at times otherwise dull and achy.  Later on during her stay here to be she begins having epigastric pain which is fairly severe and she has vomits.  Pain is worse with movement worse with percussion hitting the bumps in the road.   Past Medical History:  Diagnosis Date  . Arthritis    hands  . COPD (chronic obstructive pulmonary disease) (HCC)   . Coronary artery disease   . GERD (gastroesophageal reflux disease)   . Headache    daily  . Hyperlipidemia   . Hypertension   . MI (myocardial infarction) (HCC)    x 2  . Shortness of breath dyspnea    pt attributes to meds  . Sleep apnea     Patient Active Problem List   Diagnosis Date Noted  . GERD (gastroesophageal reflux disease) 12/06/2016  . Overactive bladder 09/24/2016  . COPD (chronic obstructive pulmonary disease) (HCC) 09/24/2016  . 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 HLD (hyperlipidemia) 11/01/2015  . Depression 11/01/2015  . History of ST elevation myocardial infarction (STEMI) 09/27/2015    Past Surgical History:  Procedure Laterality Date  . CARDIAC CATHETERIZATION N/A 09/27/2015   Procedure: Left Heart Cath and Coronary Angiography;  Surgeon: Alwyn Pea, MD;  Location: [facility] INVASIVE CV LAB;  Service: Cardiovascular;  Laterality: N/A;  . CARDIAC CATHETERIZATION N/A 09/27/2015   Procedure: Coronary Stent Intervention;  Surgeon: Alwyn Pea, MD;  Location: [facility] INVASIVE CV LAB;  Service: Cardiovascular;  Laterality: N/A;  .  Cardiac stents  09/27/2015  . CHOLECYSTECTOMY    . COLONOSCOPY WITH PROPOFOL N/A 11/19/2015   Procedure: COLONOSCOPY WITH PROPOFOL;  Surgeon: Midge Minium, MD;  Location: [facility];  Service: Endoscopy;  Laterality: N/A;  . INDUCED ABORTION    . MULTIPLE TOOTH EXTRACTIONS    . TUBAL LIGATION      Prior to Admission medications   Medication Sig Start Date End Date Taking? Authorizing Provider  albuterol (PROVENTIL HFA;VENTOLIN HFA) 108 (90 Base) MCG/ACT inhaler Inhale 2 puffs into the lungs 2 (two) times daily. 05/01/16 05/01/17  [provider]  atorvastatin (LIPITOR) 80 MG tablet Take 1 tablet (80 mg total) by mouth daily at 6 PM. 09/28/17   Johnson, Megan P, DO  budesonide-formoterol (SYMBICORT) 160-4.5 MCG/ACT inhaler Inhale 2 puffs into the lungs 2 (two) times daily. 09/28/17   Johnson, Megan P, DO  buPROPion (WELLBUTRIN SR) 200 MG 12 hr tablet 1 tab daily for 1 week, then 1 tab BID 09/28/17   Johnson, Megan P, DO  cyclobenzaprine (FLEXERIL) 10 MG tablet Take 1 tablet (10 mg total) by mouth 3 (three) times daily as needed for muscle spasms. 04/24/17   Johnson, Megan P, DO  FLUoxetine (PROZAC) 20 MG tablet Take 3 tablets (60 mg total) by mouth daily. 11/16/17   Johnson, Megan P, DO  hydrochlorothiazide (HYDRODIURIL) 25 MG tablet Take 1 tablet (25 mg total) by mouth daily. 09/28/17   Johnson, Megan P, DO  hydrocortisone (ANUSOL-HC) 25 MG suppository Place 1 suppository (  25 mg total) rectally 2 (two) times daily. 11/28/16   Johnson, Megan P, DO  lisinopril (PRINIVIL,ZESTRIL) 10 MG tablet Take 1 tablet (10 mg total) by mouth daily. 09/28/17   Johnson, Megan P, DO  metoprolol tartrate (LOPRESSOR) 50 MG tablet Take 0.5 tablets (25 mg total) by mouth 2 (two) times daily. 09/28/17   Johnson, Megan P, DO  mirabegron ER (MYRBETRIQ) 50 MG TB24 tablet Take 1 tablet (50 mg total) by mouth daily. 09/28/17   Johnson, Megan P, DO  omeprazole (PRILOSEC) 20 MG capsule Take 1 capsule (20 mg total) by  mouth 2 (two) times daily before a meal. 09/28/17   Johnson, Megan P, DO  ondansetron (ZOFRAN ODT) 4 MG disintegrating tablet Take 1 tablet (4 mg total) by mouth every 8 (eight) hours as needed for nausea or vomiting. 11/30/17   Laural Benes, Megan P, DO  ticagrelor (BRILINTA) 90 MG TABS tablet Take 1 tablet (90 mg total) by mouth 2 (two) times daily. 04/24/17   Johnson, Megan P, DO  traZODone (DESYREL) 50 MG tablet Take 1-2 tablets (50-100 mg total) by mouth at bedtime as needed for sleep. 09/28/17   Johnson, Megan P, DO  valACYclovir (VALTREX) 1000 MG tablet Take 1 tablet (1,000 mg total) by mouth 2 (two) times daily. 09/28/17   Olevia Perches P, DO    Allergies Cranberry and Tape  Family History  Problem Relation Age of Onset  . Alcohol abuse Mother   . Arthritis Mother   . Asthma Mother   . Cancer Mother   . Hypertension Mother   . Migraines Mother   . Hyperlipidemia Sister   . Hyperlipidemia Brother   . Heart disease Maternal Grandmother   . Emphysema Maternal Grandfather   . Breast cancer Paternal Aunt 30       double mastectomy    Social History Social History   Tobacco Use  . Smoking status: Current Every Day Smoker    Packs/day: 0.50    Years: 40.00    Pack years: 20.00    Types: Cigarettes  . Smokeless tobacco: Never Used  Substance Use Topics  . Alcohol use: No  . Drug use: No    Review of Systems  Constitutional: No fever/chills Eyes: No visual changes. ENT: No sore throat. Cardiovascular: Denies chest pain. Respiratory: Denies shortness of breath. Gastrointestinal:  abdominal pain.  nausea,  vomiting.  No diarrhea.  No constipation. Genitourinary: Negative for dysuria. Musculoskeletal: Negative for back pain. Skin: Negative for rash. Neurological: Negative for headaches, focal weakness   ____________________________________________   PHYSICAL EXAM:  VITAL SIGNS: ED Triage Vitals  Enc Vitals Group     BP 02/18/18 1901 91/64     Pulse Rate 02/18/18 1901  70     Resp 02/18/18 1859 16     Temp 02/18/18 1901 (!) 97.2 F (36.2 C)     Temp Source 02/18/18 1901 Axillary     SpO2 02/18/18 1901 95 %     Weight 02/18/18 1903 138 lb (62.6 kg)     Height 02/18/18 1903 5\' 5"  (1.651 m)     Head Circumference --      Peak Flow --      Pain Score 02/18/18 1901 10     Pain Loc --      Pain Edu? --      Excl. in GC? --     Constitutional: Alert and oriented.  Ill appearing and in no acute distress. Eyes: Conjunctivae are normal.  Head: Atraumatic. Nose:  No congestion/rhinnorhea. Mouth/Throat: Mucous membranes are moist.  Oropharynx non-erythematous. Neck: No stridor.  Cardiovascular: Normal rate, regular rhythm. Grossly normal heart sounds.  Good peripheral circulation. Respiratory: Normal respiratory effort.  No retractions. Lungs CTAB. Gastrointestinal: Soft tender to palpation percussion around the left lower quadrant. No distention. No abdominal bruits. No CVA tenderness. Musculoskeletal: No lower extremity tenderness nor edema.  No joint effusions. Neurologic:  Normal speech and language. No gross focal neurologic deficits are appreciated. No gait instability. Skin:  Skin is warm, dry and intact. No rash noted. Psychiatric: Mood and affect are normal. Speech and behavior are normal.  ____________________________________________   LABS (all labs ordered are listed, but only abnormal results are displayed)  Labs Reviewed  COMPREHENSIVE METABOLIC PANEL - Abnormal; Notable for the following components:      Result Value   Glucose, Bld 127 (*)    BUN 36 (*)    Creatinine, Ser 2.27 (*)    Calcium 8.8 (*)    GFR calc non Af Amer 23 (*)    GFR calc Af Amer 27 (*)    All other components within normal limits  CBC - Abnormal; Notable for the following components:   WBC 12.6 (*)    All other components within normal limits  URINALYSIS, COMPLETE (UACMP) WITH MICROSCOPIC - Abnormal; Notable for the following components:   Color, Urine YELLOW  (*)    APPearance CLOUDY (*)    Protein, ur 30 (*)    Nitrite POSITIVE (*)    Leukocytes, UA LARGE (*)    WBC, UA >50 (*)    Bacteria, UA MANY (*)    All other components within normal limits  GASTROINTESTINAL PANEL BY PCR, STOOL (REPLACES STOOL CULTURE)  C DIFFICILE QUICK SCREEN W PCR REFLEX  LIPASE, BLOOD  PREGNANCY, URINE   ____________________________________________  EKG  EKG read interpreted by me shows normal sinus rhythm rate of 68 normal axis acute ST-T wave changes QTC is 513 ms per computer ____________________________________________  RADIOLOGY  ED MD interpretation: CT is read as likely colitis there is patchy thickening of the wall of the colon  Official radiology report(s): Ct Abdomen Pelvis Wo Contrast  Result Date: 02/18/2018 CLINICAL DATA:  Lower right abdominal pain and vomiting x3. EXAM: CT ABDOMEN AND PELVIS WITHOUT CONTRAST TECHNIQUE: Multidetector CT imaging of the abdomen and pelvis was performed following the standard protocol without IV contrast. COMPARISON:  None. FINDINGS: Lower chest: Lung bases are normal. Calcified plaque over the right coronary artery. Hepatobiliary: Previous cholecystectomy. The liver and biliary tree are otherwise normal. Pancreas: Normal. Spleen: Normal. Adrenals/Urinary Tract: Adrenal glands are normal. Kidneys normal in size with mild bilateral nephrolithiasis. Slight decrease in size of the right kidney. No hydronephrosis. Ureters and bladder are normal. Stomach/Bowel: Stomach and small bowel are normal. Appendix is normal. There are patchy areas of mild wall thickening involving the colon most prominent over the distal descending/sigmoid colon which may be due to an acute colitis of infectious or inflammatory nature. There is no significant adjacent free fluid or inflammatory change. Vascular/Lymphatic: Minimal calcified plaque over the abdominal aorta. No significant adenopathy. Reproductive: Within normal.  Evidence of previous  tubal ligation. Other: None. Musculoskeletal: Minimal degenerate change of the spine. IMPRESSION: Patchy mild wall thickening of the colon worse over the distal descending/sigmoid colon which may be seen with an acute colitis of infectious or inflammatory nature. Mild bilateral nephrolithiasis.  No ureteral stones or obstruction. Aortic Atherosclerosis (ICD10-I70.0). Electronically Signed   By: Elberta Fortis M.D.  On: 02/18/2018 22:32    ____________________________________________   PROCEDURES  Procedure(s) performed:   Procedures  Critical Care performed:   ____________________________________________   INITIAL IMPRESSION / ASSESSMENT AND PLAN / ED COURSE  Patient having fairly severe pain with some peritoneal signs.  She has a minimally elevated white count though I think the best thing to do would be to admit her overnight and see how she is doing in the morning.  Give her some antibiotics.         ____________________________________________   FINAL CLINICAL IMPRESSION(S) / ED DIAGNOSES  Final diagnoses:  Lower abdominal pain  Colitis  Urinary tract infection without hematuria, site unspecified     ED Discharge Orders    None       Note:  This document was prepared using Dragon voice recognition software and may include unintentional dictation errors.    Arnaldo Natal, MD 02/18/18 [id number]    Arnaldo Natal, MD 02/18/18 [phone]

## 2018-02-18 NOTE — ED Notes (Signed)
Pt vomited viscous lidocaine and contrast.

## 2018-02-18 NOTE — ED Notes (Signed)
To CT Scan

## 2018-02-18 NOTE — ED Notes (Signed)
Pt c/o chest pain.  Additional EKG done.  Dr. Darnelle Catalan updated and read EKG.

## 2018-02-18 NOTE — ED Triage Notes (Signed)
Per [facility], pt co severe lower right abd pain.  Vomited X3 earlier.  Unknown last BM.  Received 200cc of NS enroute to [facility].  Hx:  MI x 2 stents, COPD  VS:  83/56  Allergic:  Latex and tape

## 2018-02-19 ENCOUNTER — Other Ambulatory Visit: Payer: Self-pay

## 2018-02-19 DIAGNOSIS — N179 Acute kidney failure, unspecified: Secondary | ICD-10-CM | POA: Diagnosis not present

## 2018-02-19 DIAGNOSIS — I10 Essential (primary) hypertension: Secondary | ICD-10-CM | POA: Diagnosis not present

## 2018-02-19 DIAGNOSIS — I2510 Atherosclerotic heart disease of native coronary artery without angina pectoris: Secondary | ICD-10-CM | POA: Diagnosis not present

## 2018-02-19 DIAGNOSIS — K5909 Other constipation: Secondary | ICD-10-CM | POA: Diagnosis present

## 2018-02-19 DIAGNOSIS — E860 Dehydration: Secondary | ICD-10-CM | POA: Diagnosis present

## 2018-02-19 DIAGNOSIS — E785 Hyperlipidemia, unspecified: Secondary | ICD-10-CM | POA: Diagnosis present

## 2018-02-19 DIAGNOSIS — E440 Moderate protein-calorie malnutrition: Secondary | ICD-10-CM

## 2018-02-19 DIAGNOSIS — Z79899 Other long term (current) drug therapy: Secondary | ICD-10-CM | POA: Diagnosis not present

## 2018-02-19 DIAGNOSIS — K219 Gastro-esophageal reflux disease without esophagitis: Secondary | ICD-10-CM | POA: Diagnosis present

## 2018-02-19 DIAGNOSIS — N200 Calculus of kidney: Secondary | ICD-10-CM | POA: Diagnosis not present

## 2018-02-19 DIAGNOSIS — Z7902 Long term (current) use of antithrombotics/antiplatelets: Secondary | ICD-10-CM | POA: Diagnosis not present

## 2018-02-19 DIAGNOSIS — F17210 Nicotine dependence, cigarettes, uncomplicated: Secondary | ICD-10-CM | POA: Diagnosis present

## 2018-02-19 DIAGNOSIS — Z9049 Acquired absence of other specified parts of digestive tract: Secondary | ICD-10-CM | POA: Diagnosis not present

## 2018-02-19 DIAGNOSIS — J449 Chronic obstructive pulmonary disease, unspecified: Secondary | ICD-10-CM | POA: Diagnosis present

## 2018-02-19 DIAGNOSIS — I252 Old myocardial infarction: Secondary | ICD-10-CM | POA: Diagnosis not present

## 2018-02-19 DIAGNOSIS — G4730 Sleep apnea, unspecified: Secondary | ICD-10-CM | POA: Diagnosis present

## 2018-02-19 DIAGNOSIS — K529 Noninfective gastroenteritis and colitis, unspecified: Secondary | ICD-10-CM | POA: Diagnosis not present

## 2018-02-19 DIAGNOSIS — N390 Urinary tract infection, site not specified: Secondary | ICD-10-CM | POA: Diagnosis not present

## 2018-02-19 DIAGNOSIS — Z7951 Long term (current) use of inhaled steroids: Secondary | ICD-10-CM | POA: Diagnosis not present

## 2018-02-19 DIAGNOSIS — R1032 Left lower quadrant pain: Secondary | ICD-10-CM | POA: Diagnosis not present

## 2018-02-19 LAB — HEMOGLOBIN A1C
Hgb A1c MFr Bld: 5.3 % (ref 4.8–5.6)
Mean Plasma Glucose: 105.41 mg/dL

## 2018-02-19 LAB — GLUCOSE, CAPILLARY: Glucose-Capillary: 83 mg/dL (ref 70–99)

## 2018-02-19 LAB — TSH: TSH: 7.821 u[IU]/mL — ABNORMAL HIGH (ref 0.350–4.500)

## 2018-02-19 MED ORDER — ACETAMINOPHEN 650 MG RE SUPP: 650.0000 mg | Freq: Four times a day (QID) | RECTAL | Status: DC | PRN

## 2018-02-19 MED ORDER — METRONIDAZOLE 500 MG PO TABS
500.0000 mg | ORAL_TABLET | Freq: Three times a day (TID) | ORAL | Status: DC
  Administered 2018-02-19 – 2018-02-20 (×4): 500 mg via ORAL
  Filled 2018-02-19 (×6): qty 1

## 2018-02-19 MED ORDER — NICOTINE 21 MG/24HR TD PT24
21.0000 mg | MEDICATED_PATCH | Freq: Every day | TRANSDERMAL | Status: DC
  Administered 2018-02-19 – 2018-02-20 (×2): 21 mg via TRANSDERMAL
  Filled 2018-02-19 (×2): qty 1

## 2018-02-19 MED ORDER — HYDROMORPHONE HCL 1 MG/ML IJ SOLN
0.5000 mg | Freq: Once | INTRAMUSCULAR | Status: AC
  Administered 2018-02-19: 0.5 mg via INTRAVENOUS
  Filled 2018-02-19: qty 1

## 2018-02-19 MED ORDER — ENSURE ENLIVE PO LIQD
237.0000 mL | Freq: Two times a day (BID) | ORAL | Status: DC
  Administered 2018-02-19 – 2018-02-20 (×2): 237 mL via ORAL

## 2018-02-19 MED ORDER — TRAMADOL HCL 50 MG PO TABS
50.0000 mg | ORAL_TABLET | Freq: Four times a day (QID) | ORAL | Status: DC | PRN
  Administered 2018-02-19 – 2018-02-20 (×3): 50 mg via ORAL
  Filled 2018-02-19 (×3): qty 1

## 2018-02-19 MED ORDER — LISINOPRIL 10 MG PO TABS
10.0000 mg | ORAL_TABLET | Freq: Every day | ORAL | Status: DC
  Administered 2018-02-19 – 2018-02-20 (×2): 10 mg via ORAL
  Filled 2018-02-19 (×2): qty 1

## 2018-02-19 MED ORDER — TICAGRELOR 90 MG PO TABS
90.0000 mg | ORAL_TABLET | Freq: Two times a day (BID) | ORAL | Status: DC
  Administered 2018-02-19 – 2018-02-20 (×3): 90 mg via ORAL
  Filled 2018-02-19 (×3): qty 1

## 2018-02-19 MED ORDER — FLUTICASONE FUROATE-VILANTEROL 200-25 MCG/INH IN AEPB
1.0000 | INHALATION_SPRAY | Freq: Every day | RESPIRATORY_TRACT | Status: DC
  Administered 2018-02-19 – 2018-02-20 (×2): 1 via RESPIRATORY_TRACT
  Filled 2018-02-19: qty 28

## 2018-02-19 MED ORDER — MIRABEGRON ER 50 MG PO TB24
50.0000 mg | ORAL_TABLET | Freq: Every day | ORAL | Status: DC
  Administered 2018-02-19: 50 mg via ORAL
  Filled 2018-02-19: qty 1

## 2018-02-19 MED ORDER — ADULT MULTIVITAMIN W/MINERALS CH
1.0000 | ORAL_TABLET | Freq: Every day | ORAL | Status: DC
  Administered 2018-02-19 – 2018-02-20 (×2): 1 via ORAL
  Filled 2018-02-19 (×2): qty 1

## 2018-02-19 MED ORDER — BUPROPION HCL ER (SR) 100 MG PO TB12
200.0000 mg | ORAL_TABLET | Freq: Two times a day (BID) | ORAL | Status: DC
  Administered 2018-02-19 – 2018-02-20 (×3): 200 mg via ORAL
  Filled 2018-02-19 (×4): qty 2

## 2018-02-19 MED ORDER — TRAZODONE HCL 50 MG PO TABS
50.0000 mg | ORAL_TABLET | Freq: Every evening | ORAL | Status: DC | PRN
  Administered 2018-02-19: 50 mg via ORAL
  Filled 2018-02-19: qty 1

## 2018-02-19 MED ORDER — PANTOPRAZOLE SODIUM 40 MG PO TBEC
40.0000 mg | DELAYED_RELEASE_TABLET | Freq: Every day | ORAL | Status: DC
  Administered 2018-02-19 – 2018-02-20 (×2): 40 mg via ORAL
  Filled 2018-02-19 (×2): qty 1

## 2018-02-19 MED ORDER — ONDANSETRON HCL 4 MG/2ML IJ SOLN
4.0000 mg | Freq: Four times a day (QID) | INTRAMUSCULAR | Status: DC | PRN
  Administered 2018-02-20: 4 mg via INTRAVENOUS
  Filled 2018-02-19: qty 2

## 2018-02-19 MED ORDER — DOCUSATE SODIUM 100 MG PO CAPS
100.0000 mg | ORAL_CAPSULE | Freq: Two times a day (BID) | ORAL | Status: DC
  Administered 2018-02-19 – 2018-02-20 (×3): 100 mg via ORAL
  Filled 2018-02-19 (×3): qty 1

## 2018-02-19 MED ORDER — ALBUTEROL SULFATE (2.5 MG/3ML) 0.083% IN NEBU: 2.5000 mg | INHALATION_SOLUTION | RESPIRATORY_TRACT | Status: DC | PRN

## 2018-02-19 MED ORDER — ALUM & MAG HYDROXIDE-SIMETH 200-200-20 MG/5ML PO SUSP
15.0000 mL | ORAL | Status: DC | PRN
  Administered 2018-02-19: 15 mL via ORAL
  Filled 2018-02-19: qty 30

## 2018-02-19 MED ORDER — ATORVASTATIN CALCIUM 20 MG PO TABS
80.0000 mg | ORAL_TABLET | Freq: Every day | ORAL | Status: DC
  Administered 2018-02-19: 80 mg via ORAL
  Filled 2018-02-19: qty 4

## 2018-02-19 MED ORDER — CIPROFLOXACIN IN D5W 400 MG/200ML IV SOLN
400.0000 mg | INTRAVENOUS | Status: DC
  Administered 2018-02-19: 400 mg via INTRAVENOUS
  Filled 2018-02-19 (×3): qty 200

## 2018-02-19 MED ORDER — ONDANSETRON HCL 4 MG PO TABS: 4.0000 mg | ORAL_TABLET | Freq: Four times a day (QID) | ORAL | Status: DC | PRN

## 2018-02-19 MED ORDER — HEPARIN SODIUM (PORCINE) 5000 UNIT/ML IJ SOLN
5000.0000 [IU] | Freq: Three times a day (TID) | INTRAMUSCULAR | Status: DC
  Administered 2018-02-19 – 2018-02-20 (×4): 5000 [IU] via SUBCUTANEOUS
  Filled 2018-02-19 (×4): qty 1

## 2018-02-19 MED ORDER — FLUOXETINE HCL 20 MG PO CAPS
60.0000 mg | ORAL_CAPSULE | Freq: Every day | ORAL | Status: DC
  Administered 2018-02-19 – 2018-02-20 (×2): 60 mg via ORAL
  Filled 2018-02-19 (×2): qty 3

## 2018-02-19 MED ORDER — VALACYCLOVIR HCL 500 MG PO TABS
1000.0000 mg | ORAL_TABLET | Freq: Two times a day (BID) | ORAL | Status: DC
  Administered 2018-02-19 – 2018-02-20 (×3): 1000 mg via ORAL
  Filled 2018-02-19 (×4): qty 2

## 2018-02-19 MED ORDER — METOPROLOL TARTRATE 25 MG PO TABS
25.0000 mg | ORAL_TABLET | Freq: Two times a day (BID) | ORAL | Status: DC
  Administered 2018-02-19 – 2018-02-20 (×3): 25 mg via ORAL
  Filled 2018-02-19 (×3): qty 1

## 2018-02-19 MED ORDER — HYDROCHLOROTHIAZIDE 25 MG PO TABS
25.0000 mg | ORAL_TABLET | Freq: Every day | ORAL | Status: DC
  Administered 2018-02-19 – 2018-02-20 (×2): 25 mg via ORAL
  Filled 2018-02-19 (×2): qty 1

## 2018-02-19 MED ORDER — LEVOTHYROXINE SODIUM 25 MCG PO TABS
25.0000 ug | ORAL_TABLET | Freq: Every day | ORAL | Status: DC
  Administered 2018-02-20: 25 ug via ORAL
  Filled 2018-02-19: qty 1

## 2018-02-19 MED ORDER — SODIUM CHLORIDE 0.9 % IV SOLN
INTRAVENOUS | Status: DC
  Administered 2018-02-19 – 2018-02-20 (×4): via INTRAVENOUS

## 2018-02-19 MED ORDER — ACETAMINOPHEN 325 MG PO TABS
650.0000 mg | ORAL_TABLET | Freq: Four times a day (QID) | ORAL | Status: DC | PRN
  Administered 2018-02-19 – 2018-02-20 (×2): 650 mg via ORAL
  Filled 2018-02-19 (×2): qty 2

## 2018-02-19 NOTE — Progress Notes (Signed)
Pharmacy Antibiotic Note  Natalie Taylor is a 57 y.o. female admitted on 02/18/2018 with intra-abdominal infection.  Pharmacy has been consulted for ciprofloxacin dosing.  Plan: Ciprofloxacin 400 mg IV q 24 hours ordered.  Height: 5\' 5"  (165.1 cm) Weight: 138 lb (62.6 kg) IBW/kg (Calculated) : 57  Temp (24hrs), Avg:97.5 F (36.4 C), Min:97.2 F (36.2 C), Max:97.8 F (36.6 C)  Recent Labs  Lab 02/18/18 1908  WBC 12.6*  CREATININE 2.27*    Estimated Creatinine Clearance: 25.2 mL/min (A) (by C-G formula based on SCr of 2.27 mg/dL (H)).    Allergies  Allergen Reactions  . Cranberry Hives    Cranberry juice  . Tape Rash    Can use paper tape    Antimicrobials this admission: 11/1 Cipro  >>    >>   Dose adjustments this admission:   Microbiology results:  Thank you for allowing pharmacy to be a part of this patient's care.  Lon Klippel S 02/19/2018 5:31 AM

## 2018-02-19 NOTE — Progress Notes (Signed)
[facility] - [place] [facility]   PATIENT NAME: Natalie Taylor    MR#:  [id number]  DATE OF BIRTH:  October 02, 1960  SUBJECTIVE:   Patient came in with abdominal pain. She is not had a BM for one week. Complains of some nausea. REVIEW OF SYSTEMS:   Review of Systems  Constitutional: Negative for chills, fever and weight loss.  HENT: Negative for ear discharge, ear pain and nosebleeds.   Eyes: Negative for blurred vision, pain and discharge.  Respiratory: Negative for sputum production, shortness of breath, wheezing and stridor.   Cardiovascular: Negative for chest pain, palpitations, orthopnea and PND.  Gastrointestinal: Negative for abdominal pain, diarrhea, nausea and vomiting.  Genitourinary: Negative for frequency and urgency.  Musculoskeletal: Negative for back pain and joint pain.  Neurological: Negative for sensory change, speech change, focal weakness and weakness.  Psychiatric/Behavioral: Negative for depression and hallucinations. The patient is not nervous/anxious.    Tolerating Diet: Clears Tolerating PT: ambulatory  DRUG ALLERGIES:   Allergies  Allergen Reactions  . Cranberry Hives    Cranberry juice  . Tape Rash    Can use paper tape    VITALS:  Blood pressure 133/79, pulse (!) 55, temperature 97.9 F (36.6 C), temperature source Oral, resp. rate 16, height 5\' 5"  (1.651 m), weight 62.6 kg, SpO2 99 %.  PHYSICAL EXAMINATION:   Physical Exam  GENERAL:  57 y.o.-year-old patient lying in the bed with no acute distress.  EYES: Pupils equal, round, reactive to light and accommodation. No scleral icterus. Extraocular muscles intact.  HEENT: Head atraumatic, normocephalic. Oropharynx and nasopharynx clear.  NECK:  Supple, no jugular venous distention. No thyroid enlargement, no tenderness.  LUNGS: Normal breath sounds bilaterally, no wheezing, rales, rhonchi. No use of accessory muscles of respiration.  CARDIOVASCULAR: S1, S2 normal. No  murmurs, rubs, or gallops.  ABDOMEN: Soft, nontender, nondistended. Bowel sounds present. No organomegaly or mass.  EXTREMITIES: No cyanosis, clubbing or edema b/l.    NEUROLOGIC: Cranial nerves II through XII are intact. No focal Motor or sensory deficits b/l.   PSYCHIATRIC:  patient is alert and oriented x 3.  SKIN: No obvious rash, lesion, or ulcer.   LABORATORY PANEL:  CBC Recent Labs  Lab 02/18/18 1908  WBC 12.6*  HGB 12.6  HCT 38.6  PLT 249    Chemistries  Recent Labs  Lab 02/18/18 1908  NA 136  K 4.1  CL 107  CO2 23  GLUCOSE 127*  BUN 36*  CREATININE 2.27*  CALCIUM 8.8*  AST 17  ALT 11  ALKPHOS 123  BILITOT 0.7   Cardiac Enzymes No results for input(s): TROPONINI in the last 168 hours. RADIOLOGY:  Ct Abdomen Pelvis Wo Contrast  Result Date: 02/18/2018 CLINICAL DATA:  Lower right abdominal pain and vomiting x3. EXAM: CT ABDOMEN AND PELVIS WITHOUT CONTRAST TECHNIQUE: Multidetector CT imaging of the abdomen and pelvis was performed following the standard protocol without IV contrast. COMPARISON:  None. FINDINGS: Lower chest: Lung bases are normal. Calcified plaque over the right coronary artery. Hepatobiliary: Previous cholecystectomy. The liver and biliary tree are otherwise normal. Pancreas: Normal. Spleen: Normal. Adrenals/Urinary Tract: Adrenal glands are normal. Kidneys normal in size with mild bilateral nephrolithiasis. Slight decrease in size of the right kidney. No hydronephrosis. Ureters and bladder are normal. Stomach/Bowel: Stomach and small bowel are normal. Appendix is normal. There are patchy areas of mild wall thickening involving the colon most prominent over the distal descending/sigmoid colon which may be due to  an acute colitis of infectious or inflammatory nature. There is no significant adjacent free fluid or inflammatory change. Vascular/Lymphatic: Minimal calcified plaque over the abdominal aorta. No significant adenopathy. Reproductive: Within  normal.  Evidence of previous tubal ligation. Other: None. Musculoskeletal: Minimal degenerate change of the spine. IMPRESSION: Patchy mild wall thickening of the colon worse over the distal descending/sigmoid colon which may be seen with an acute colitis of infectious or inflammatory nature. Mild bilateral nephrolithiasis.  No ureteral stones or obstruction. Aortic Atherosclerosis (ICD10-I70.0). Electronically Signed   By: Elberta Fortis M.D.   On: 02/18/2018 22:32   ASSESSMENT AND PLAN:   Natalie Taylor is a 57 y.o. female patient complains of severe left lower quadrant pain .  Is been going on for 2 days getting worse.  1.  Acute Colitis: Obtain GI panel-- has not has MM for more than 1 week -  Continue antibiotics IV Cipro and Flagyl    2.  Acute kidney injury: Secondary to dehydration due to above.  Hydrate with intravenous fluid.  Avoid nephrotoxic agents. -Came in with creatinine of 2.27 -creatinine 0.9  3.  Coronary artery disease: Stable; continue Brilinta.  4.  Hypertension: Controlled; continue hydrochlorothiazide, lisinopril and metoprolol.  5.  Hyperlipidemia: Continue statin therapy for cardiovascular risk reduction  6.  COPD: Stable; continue inhaled corticosteroid.  Albuterol as needed.  7.  DVT prophylaxis: Heparin  8.  GI prophylaxis: PPI per home regimen  Case discussed with Care Management/Social Worker. Management plans discussed with the patient, family and they are in agreement.  CODE STATUS: full  DVT Prophylaxis: heparin  TOTAL TIME TAKING CARE OF THIS PATIENT: *30* minutes.  >50% time spent on counselling and coordination of care  POSSIBLE D/C IN *1-2* DAYS, DEPENDING ON CLINICAL CONDITION.  Note: This dictation was prepared with Dragon dictation along with smaller phrase technology. Any transcriptional errors that result from this process are unintentional.  Enedina Finner M.D on 02/19/2018 at 3:55 PM  Between 7am to 6pm - Pager - [phone]  After  6pm go to [email or web address] - Social research officer, government  [facility]  Office  [phone]  CC: Primary care physician; Dorcas Carrow, DOPatient ID: Natalie Taylor, female   DOB: April 18, 1961, 57 y.o.   MRN: [id number]

## 2018-02-19 NOTE — Progress Notes (Signed)
   02/19/18 2200  Clinical Encounter Type  Visited With Patient;Other (Comment) (Partner [name])  Visit Type Follow-up;Psychological support  Recommendations Follow-up as requested.  Spiritual Encounters  Spiritual Needs Emotional   Patient, and partner [name], continued their life's narrative. The patient is remembering childhood traumas about powerlessness, guilt, and shame. She is also reflecting on why her three oldest children are estranged. The patient has created and been consumed by multiple layers of family dynamics. Further, homelessness, poverty, and poor decisions are intertwined in her narrative. Chaplain encouraged the patient to use this moment of openness to consider thinking about resolution, forgiveness, and peace. Likewise, Chaplain encouraged [name] to consider the damage and harm that his anger has caused in his life. The patient's illness is a catalyst for deep conversations about faith, family, and forgiveness. Chaplain is unsure whether the patient will use this opportunity to make healing and life-giving decisions. Chaplain provided active listening and emotional support.

## 2018-02-19 NOTE — H&P (Signed)
Natalie Taylor is an 57 y.o. female.   Chief Complaint: Abdominal pain HPI: The patient with past medical history of CAD status post myocardial infarction and PCI, COPD, sleep apnea and hypertension presents the emergency department complaining of abdominal pain.  Her pain began earlier this afternoon and she describes as "the worst cramping in her life".  She admits to feeling feverish and then having chills.  She has had some nausea with one episode of nonbloody nonbilious emesis.  Her stool has been mucousy and she has had multiple episodes of diarrhea.  CT of her abdomen revealed colitis.  The patient was given Flagyl and Cipro emergency department prior to the hospitalist service being called for further management.  Past Medical History:  Diagnosis Date  . Arthritis    hands  . COPD (chronic obstructive pulmonary disease) ([place])   . Coronary artery disease   . GERD (gastroesophageal reflux disease)   . Headache    daily  . Hyperlipidemia   . Hypertension   . MI (myocardial infarction) ([place])    x 2  . Shortness of breath dyspnea    pt attributes to meds  . Sleep apnea     Past Surgical History:  Procedure Laterality Date  . CARDIAC CATHETERIZATION N/A 09/27/2015   Procedure: Left Heart Cath and Coronary Angiography;  Surgeon: Yolonda Kida, MD;  Location: [place] [facility];  Service: Cardiovascular;  Laterality: N/A;  . CARDIAC CATHETERIZATION N/A 09/27/2015   Procedure: Coronary Stent Intervention;  Surgeon: Yolonda Kida, MD;  Location: [place] [facility];  Service: Cardiovascular;  Laterality: N/A;  . Cardiac stents  09/27/2015  . CHOLECYSTECTOMY    . COLONOSCOPY WITH PROPOFOL N/A 11/19/2015   Procedure: COLONOSCOPY WITH PROPOFOL;  Surgeon: Lucilla Lame, MD;  Location: [place];  Service: Endoscopy;  Laterality: N/A;  . INDUCED ABORTION    . MULTIPLE TOOTH EXTRACTIONS    . TUBAL LIGATION      Family History  Problem Relation Age of Onset  . Alcohol  abuse Mother   . Arthritis Mother   . Asthma Mother   . Cancer Mother   . Hypertension Mother   . Migraines Mother   . Hyperlipidemia Sister   . Hyperlipidemia Brother   . Heart disease Maternal Grandmother   . Emphysema Maternal Grandfather   . Breast cancer Paternal Aunt 47       double mastectomy   Social History:  reports that she has been smoking cigarettes. She has a 20.00 pack-year smoking history. She has never used smokeless tobacco. She reports that she does not drink alcohol or use drugs.  Allergies:  Allergies  Allergen Reactions  . Cranberry Hives    Cranberry juice  . Tape Rash    Can use paper tape     (Not in a hospital admission)  Results for orders placed or performed during the hospital encounter of 02/18/18 (from the past 48 hour(s))  Lipase, blood     Status: None   Collection Time: 02/18/18  7:08 PM  Result Value Ref Range   Lipase 44 11 - 51 U/L    Comment: Performed at [facility], [address] [place], [place] [address]  Comprehensive metabolic panel     Status: Abnormal   Collection Time: 02/18/18  7:08 PM  Result Value Ref Range   Sodium 136 135 - 145 mmol/L   Potassium 4.1 3.5 - 5.1 mmol/L   Chloride 107 98 - 111 mmol/L  CO2 23 22 - 32 mmol/L   Glucose, Bld 127 (H) 70 - 99 mg/dL   BUN 36 (H) 6 - 20 mg/dL   Creatinine, Ser 2.27 (H) 0.44 - 1.00 mg/dL   Calcium 8.8 (L) 8.9 - 10.3 mg/dL   Total Protein 7.5 6.5 - 8.1 g/dL   Albumin 3.9 3.5 - 5.0 g/dL   AST 17 15 - 41 U/L   ALT 11 0 - 44 U/L   Alkaline Phosphatase 123 38 - 126 U/L   Total Bilirubin 0.7 0.3 - 1.2 mg/dL   GFR calc non Af Amer 23 (L) >60 mL/min   GFR calc Af Amer 27 (L) >60 mL/min    Comment: (NOTE) The eGFR has been calculated using the CKD EPI equation. This calculation has not been validated in all clinical situations. eGFR's persistently <60 mL/min signify possible Chronic Kidney Disease.    Anion gap 6 5 - 15    Comment: Performed at [facility], [address] [place], [place] [address]  CBC     Status: Abnormal   Collection Time: 02/18/18  7:08 PM  Result Value Ref Range   WBC 12.6 (H) 4.0 - 10.5 K/uL   RBC 4.25 3.87 - 5.11 MIL/uL   Hemoglobin 12.6 12.0 - 15.0 g/dL   HCT 38.6 36.0 - 46.0 %   MCV 90.8 80.0 - 100.0 fL   MCH 29.6 26.0 - 34.0 pg   MCHC 32.6 30.0 - 36.0 g/dL   RDW 14.2 11.5 - 15.5 %   Platelets 249 150 - 400 K/uL   nRBC 0.0 0.0 - 0.2 %    Comment: Performed at [facility], [address] [place], [place] [address]  Urinalysis, Complete w Microscopic     Status: Abnormal   Collection Time: 02/18/18 10:28 PM  Result Value Ref Range   Color, Urine YELLOW (A) YELLOW   APPearance CLOUDY (A) CLEAR   Specific Gravity, Urine 1.011 1.005 - 1.030   pH 5.0 5.0 - 8.0   Glucose, UA NEGATIVE NEGATIVE mg/dL   Hgb urine dipstick NEGATIVE NEGATIVE   Bilirubin Urine NEGATIVE NEGATIVE   Ketones, ur NEGATIVE NEGATIVE mg/dL   Protein, ur 30 (A) NEGATIVE mg/dL   Nitrite POSITIVE (A) NEGATIVE   Leukocytes, UA LARGE (A) NEGATIVE   RBC / HPF 0-5 0 - 5 RBC/hpf   WBC, UA >50 (H) 0 - 5 WBC/hpf   Bacteria, UA MANY (A) NONE SEEN   Squamous Epithelial / LPF 11-20 0 - 5   WBC Clumps PRESENT    Mucus PRESENT    Hyaline Casts, UA PRESENT     Comment: Performed at [facility], [address] [place], [place] [address]  Pregnancy, urine     Status: None   Collection Time: 02/18/18 10:28 PM  Result Value Ref Range   Preg Test, Ur NEGATIVE NEGATIVE    Comment: Performed at [facility], [address] [place], [place] [address]   Ct Abdomen Pelvis Wo Contrast  Result Date: 02/18/2018 CLINICAL DATA:  Lower right abdominal pain and vomiting x3. EXAM: CT ABDOMEN AND PELVIS WITHOUT CONTRAST TECHNIQUE: Multidetector CT imaging of the abdomen and pelvis was performed following the standard protocol without IV contrast. COMPARISON:  None. FINDINGS: Lower chest: Lung bases are normal. Calcified plaque  over the right coronary artery. Hepatobiliary: Previous cholecystectomy. The liver and biliary tree are otherwise normal. Pancreas: Normal. Spleen: Normal. Adrenals/Urinary Tract: Adrenal glands are normal. Kidneys normal in size with  mild bilateral nephrolithiasis. Slight decrease in size of the right kidney. No hydronephrosis. Ureters and bladder are normal. Stomach/Bowel: Stomach and small bowel are normal. Appendix is normal. There are patchy areas of mild wall thickening involving the colon most prominent over the distal descending/sigmoid colon which may be due to an acute colitis of infectious or inflammatory nature. There is no significant adjacent free fluid or inflammatory change. Vascular/Lymphatic: Minimal calcified plaque over the abdominal aorta. No significant adenopathy. Reproductive: Within normal.  Evidence of previous tubal ligation. Other: None. Musculoskeletal: Minimal degenerate change of the spine. IMPRESSION: Patchy mild wall thickening of the colon worse over the distal descending/sigmoid colon which may be seen with an acute colitis of infectious or inflammatory nature. Mild bilateral nephrolithiasis.  No ureteral stones or obstruction. Aortic Atherosclerosis (ICD10-I70.0). Electronically Signed   By: Marin Olp M.D.   On: 02/18/2018 22:32    Review of Systems  Constitutional: Negative for chills and fever.  HENT: Negative for sore throat and tinnitus.   Eyes: Negative for blurred vision and redness.  Respiratory: Negative for cough and shortness of breath.   Cardiovascular: Negative for chest pain, palpitations, orthopnea and PND.  Gastrointestinal: Positive for abdominal pain, diarrhea, nausea and vomiting. Negative for blood in stool.  Genitourinary: Negative for dysuria, frequency and urgency.  Musculoskeletal: Negative for joint pain and myalgias.  Skin: Negative for rash.       No lesions  Neurological: Negative for speech change, focal weakness and weakness.   Endo/Heme/Allergies: Does not bruise/bleed easily.       No temperature intolerance  Psychiatric/Behavioral: Negative for depression and suicidal ideas.    Blood pressure 122/69, pulse (!) 54, temperature 97.8 F (36.6 C), temperature source Oral, resp. rate 19, height _0  (1.651 m), weight 62.6 kg, SpO2 97 %. Physical Exam  Vitals reviewed. Constitutional: She is oriented to person, place, and time. She appears well-developed and well-nourished. No distress.  HENT:  Head: Normocephalic and atraumatic.  Mouth/Throat: Oropharynx is clear and moist.  Eyes: Pupils are equal, round, and reactive to light. Conjunctivae and EOM are normal. No scleral icterus.  Neck: Normal range of motion. Neck supple. No JVD present. No tracheal deviation present. No thyromegaly present.  Cardiovascular: Normal rate, regular rhythm and normal heart sounds. Exam reveals no gallop and no friction rub.  No murmur heard. Respiratory: Effort normal and breath sounds normal.  GI: Soft. Bowel sounds are normal. She exhibits no distension. There is no tenderness.  Genitourinary:  Genitourinary Comments: Deferred  Musculoskeletal: Normal range of motion. She exhibits no edema.  Lymphadenopathy:    She has no cervical adenopathy.  Neurological: She is alert and oriented to person, place, and time. No cranial nerve deficit. She exhibits normal muscle tone.  Skin: Skin is warm and dry. No rash noted. No erythema.  Psychiatric: She has a normal mood and affect. Her behavior is normal. Judgment and thought content normal.     Assessment/Plan This is a 57 year old female admitted for colitis. 1.  Colitis: Obtain GI panel.  Continue antibiotics.  Differential diagnosis includes viral origin. 2.  Acute kidney injury: Secondary to dehydration due to above.  Hydrate with intravenous fluid.  Avoid nephrotoxic agents. 3.  Coronary artery disease: Stable; continue Brilinta. 4.  Hypertension: Controlled; continue  hydrochlorothiazide, lisinopril and metoprolol. 5.  Hyperlipidemia: Continue statin therapy for cardiovascular risk reduction 6.  COPD: Stable; continue inhaled corticosteroid.  Albuterol as needed. 7.  DVT prophylaxis: Heparin 8.  GI prophylaxis: PPI per  home regimen The patient is a full code.  Time spent on admission orders and patient care approximately 45 minutes  Harrie Foreman, MD 02/19/2018, 6:45 AM

## 2018-02-19 NOTE — Plan of Care (Signed)

## 2018-02-19 NOTE — Progress Notes (Signed)
Pt admitted to room 254 via stretcher from emergency department. Complains of 7/10 abdominal pain.   Admission navigator completed.

## 2018-02-19 NOTE — Progress Notes (Signed)
Initial Nutrition Assessment  DOCUMENTATION CODES:   Non-severe (moderate) malnutrition in context of chronic illness  INTERVENTION:   - Ensure Enlive po BID, each supplement provides 350 kcal and 20 grams of protein (chocolate or strawberry flavor)  - MVI with minerals daily  NUTRITION DIAGNOSIS:   Moderate Malnutrition related to chronic illness (COPD) as evidenced by moderate fat depletion, moderate muscle depletion.  GOAL:   Patient will meet greater than or equal to 90% of their needs  MONITOR:   PO intake, Supplement acceptance, Labs, Weight trends  REASON FOR ASSESSMENT:   Malnutrition Screening Tool    ASSESSMENT:   57 year old female who presented to the ED on 10/31 with severe lower right abdominal pain. PMH significant for COPD, GERD, CAD, hyperlipidemia, hypertension, and MI x 2. CT of abdomen revealed colitis.  Spoke with pt and partner at bedside. Pt states that she does not eat well because "I don't feel hungry." Pt indicates that her significant other strongly encourages her to eat and that she typically eats 1 meal daily after he gets home from work. This meal includes fried chicken and sides. Pt drinks Sprite throughout the day.  Pt believes that her poor appetite is related to medications that she is taking since this is an issue that has developed more recently.  Pt shares that she has difficulty chewing as she has no teeth and occasionally has difficulty swallowing. Pt also shares that she does not have enough money to eat 3 full meals daily and has transportation issues. Noted Case Manager saw pt today.  Pt endorses weight loss over time and reports her UBW as 148 lbs. Per weight history in chart, pt with 8.6 lb weight loss over the past 3 months. This is a 5.9% weight loss which is not significant for timeframe.  Pt amenable to trying Ensure Enlive oral nutrition supplements during admission. RD encouraged pt to continue supplement consumption after  discharge and educated regarding the importance of adequate kcal and protein intake in maintaining lean muscle mass.  Medications reviewed and include: Colace 100 mg BID, Protonix 40 mg daily, IV antibiotics  Labs reviewed: BUN 36 (H), creatinine 2.27 (H)  NUTRITION - FOCUSED PHYSICAL EXAM:    Most Recent Value  Orbital Region  Mild depletion  Upper Arm Region  Moderate depletion  Thoracic and Lumbar Region  Moderate depletion  Buccal Region  Mild depletion  Temple Region  Moderate depletion  Clavicle Bone Region  Moderate depletion  Clavicle and Acromion Bone Region  Moderate depletion  Scapular Bone Region  Unable to assess  Dorsal Hand  Mild depletion  Patellar Region  Moderate depletion  Anterior Thigh Region  Moderate depletion  Posterior Calf Region  Moderate depletion  Edema (RD Assessment)  None  Hair  Reviewed  Eyes  Reviewed  Mouth  Reviewed  Skin  Reviewed  Nails  Reviewed       Diet Order:   Diet Order            Diet Heart Room service appropriate? Yes; Fluid consistency: Thin  Diet effective now              EDUCATION NEEDS:   Education needs have been addressed  Skin:  Skin Assessment: Reviewed RN Assessment  Last BM:  unknown/PTA  Height:   Ht Readings from Last 1 Encounters:  02/18/18 5\' 5"  (1.651 m)    Weight:   Wt Readings from Last 1 Encounters:  02/18/18 62.6 kg    Ideal  Body Weight:  56.82 kg  BMI:  Body mass index is 22.96 kg/m.  Estimated Nutritional Needs:   Kcal:  1650-1850  Protein:  80-90 grams  Fluid:  1.6-1.8 L    Earma Reading, MS, RD, LDN Inpatient Clinical Dietitian Pager: [phone] Weekend/After Hours: [phone]

## 2018-02-19 NOTE — Clinical Social Work Note (Signed)
CSW received consult that patient needs assistance with medications, case manager can assist with this, CSW signing off.  Ervin Knack. Temara Lanum, MSW, Theresia Majors [phone]  02/19/2018 9:31 AM

## 2018-02-19 NOTE — Care Management Note (Signed)
Case Management Note  Patient Details  Name: Natalie Taylor MRN: [id number] Date of Birth: 01/02/1961  Subjective/Objective:    Patient is from home with partner, [name] with a UTI.  She is on disability for COPD, arthritis and "back issues".   Currently has Medicaid and pays $3 for medications and for co-payment's for doctor's visits.  She recently was accepted into the transportation program through [place].  She state's she receives disability but has difficulty affording medications as she only gets enough money to cover her rent.  Left financial counseling a voicemail to ask if there are any further Medicaid medication financial assistance available.  She receives food stamps.  No current services in the home; on room air.  Current with PCP.                  Action/Plan:   Expected Discharge Date:                  Expected Discharge Plan:  Home/Self Care  In-House Referral:     Discharge planning Services  CM Consult  Post Acute Care Choice:    Choice offered to:     DME Arranged:    DME Agency:     HH Arranged:    HH Agency:     Status of Service:  In process, will continue to follow  If discussed at Long Length of Stay Meetings, dates discussed:    Additional Comments:  Sherren Kerns, RN 02/19/2018, 10:56 AM

## 2018-02-19 NOTE — Progress Notes (Signed)
Chaplain responded to request for AD. Chaplain explained the concept and options for completion. The patient became emotive and discussed issues of God's judgement, condemnation, and her fears of hell. Chaplain remained present for the patient and provided her with outside contacts, since you does not have a community of faith.   02/19/18 1100  Clinical Encounter Type  Visited With Patient;Other (Comment) (Partner)  Visit Type Initial (Request for AD)  Referral From Nurse  Recommendations Follow-up with resources.  Spiritual Encounters  Spiritual Needs Emotional;Prayer  Stress Factors  Patient Stress Factors Health changes

## 2018-02-20 LAB — BASIC METABOLIC PANEL WITH GFR
Anion gap: 5 (ref 5–15)
BUN: 21 mg/dL — ABNORMAL HIGH (ref 6–20)
CO2: 22 mmol/L (ref 22–32)
Calcium: 8.1 mg/dL — ABNORMAL LOW (ref 8.9–10.3)
Chloride: 112 mmol/L — ABNORMAL HIGH (ref 98–111)
Creatinine, Ser: 1.2 mg/dL — ABNORMAL HIGH (ref 0.44–1.00)
GFR calc Af Amer: 58 mL/min — ABNORMAL LOW
GFR calc non Af Amer: 50 mL/min — ABNORMAL LOW
Glucose, Bld: 93 mg/dL (ref 70–99)
Potassium: 3.9 mmol/L (ref 3.5–5.1)
Sodium: 139 mmol/L (ref 135–145)

## 2018-02-20 MED ORDER — IBUPROFEN 400 MG PO TABS: 400.0000 mg | ORAL_TABLET | Freq: Once | ORAL | Status: DC

## 2018-02-20 MED ORDER — METRONIDAZOLE 500 MG PO TABS: 500.0000 mg | ORAL_TABLET | Freq: Three times a day (TID) | ORAL | 0 refills | Status: AC

## 2018-02-20 MED ORDER — ONDANSETRON 4 MG PO TBDP: 4.0000 mg | ORAL_TABLET | Freq: Three times a day (TID) | ORAL | 0 refills | Status: DC | PRN

## 2018-02-20 MED ORDER — LEVOTHYROXINE SODIUM 25 MCG PO TABS: 25.0000 ug | ORAL_TABLET | Freq: Every day | ORAL | 0 refills | Status: DC

## 2018-02-20 MED ORDER — CIPROFLOXACIN HCL 500 MG PO TABS
500.0000 mg | ORAL_TABLET | Freq: Two times a day (BID) | ORAL | Status: DC
  Filled 2018-02-20 (×2): qty 1

## 2018-02-20 MED ORDER — CIPROFLOXACIN IN D5W 400 MG/200ML IV SOLN
400.0000 mg | Freq: Two times a day (BID) | INTRAVENOUS | Status: DC
  Filled 2018-02-20 (×2): qty 200

## 2018-02-20 MED ORDER — CIPROFLOXACIN HCL 500 MG PO TABS: 500.0000 mg | ORAL_TABLET | Freq: Two times a day (BID) | ORAL | 0 refills | Status: DC

## 2018-02-20 NOTE — Progress Notes (Signed)
Pharmacy Antibiotic Note  Natalie Taylor is a 57 y.o. female admitted on 02/18/2018 with intra-abdominal infection.  Pharmacy has been consulted for ciprofloxacin dosing.  Plan: Renal function improved. Ciprofloxacin 400 mg IV every 12 hours  ordered.  Height: 5\' 5"  (165.1 cm) Weight: 141 lb 4.8 oz (64.1 kg) IBW/kg (Calculated) : 57  Temp (24hrs), Avg:98 F (36.7 C), Min:97.9 F (36.6 C), Max:98.1 F (36.7 C)  Recent Labs  Lab 02/18/18 1908 02/20/18 0541  WBC 12.6*  --   CREATININE 2.27* 1.20*    Estimated Creatinine Clearance: 47.7 mL/min (A) (by C-G formula based on SCr of 1.2 mg/dL (H)).    Allergies  Allergen Reactions  . Cranberry Hives    Cranberry juice  . Tape Rash    Can use paper tape    Antimicrobials this admission: 11/1 Cipro  >>   Dose adjustments this admission:   Microbiology results:  Thank you for allowing pharmacy to be a part of this patient's care.  Gardner Candle, PharmD, BCPS Clinical Pharmacist 02/20/2018 11:08 AM

## 2018-02-20 NOTE — Discharge Summary (Signed)
[facility] - [place] [facility]   PATIENT NAME: Natalie Taylor    MR#:  [id number]  DATE OF BIRTH:  [birth year]  DATE OF ADMISSION:  02/18/2018 ADMITTING PHYSICIAN: Natalie Natal, MD  DATE OF DISCHARGE: 02/20/2018  PRIMARY CARE PHYSICIAN: Natalie Carrow, DO    ADMISSION DIAGNOSIS:  Colitis [K52.9] Lower abdominal pain [R10.30] Urinary tract infection without hematuria, site unspecified [N39.0]  DISCHARGE DIAGNOSIS:  Acute Sigmoid colitis H/ constipation  SECONDARY DIAGNOSIS:   Past Medical History:  Diagnosis Date  . Anxiety   . Arthritis    hands  . COPD (chronic obstructive pulmonary disease) (HCC)   . Coronary artery disease   . Depression   . GERD (gastroesophageal reflux disease)   . Headache    daily  . Hyperlipidemia   . Hypertension   . MI (myocardial infarction) (HCC)    x 2  . Shortness of breath dyspnea    pt attributes to meds  . Sleep apnea     HOSPITAL COURSE:   Natalie Taylor a 57 y.o.femalepatient complains of severe left lower quadrant pain . Is been going on for 2 days getting worse.  1.Acute Sigmoid Colitis: unable to Obtain GI panel-- has not has BM for more than 1 week - Continue antibiotics IV Cipro and Flagyl -- change to oral antibiotic. Patient able to tolerate soft diet.  2. Acute kidney injury: Secondary to dehydration due to above. Hydrate with intravenous fluid. Avoid nephrotoxic agents. -Came in with creatinine of 2.27-- 1.2 -baseline creatinine 0.9 -advised to continue oral hydration  3. Coronary artery disease: Stable; continue Brilinta. As any chest pain  4. Hypertension: Controlled; continue hydrochlorothiazide, lisinopril and metoprolol.  5.Hyperlipidemia: Continue statin therapy for cardiovascular risk reduction  6. COPD: Stable; continue inhaled corticosteroid. Albuterol as needed.  7. DVT prophylaxis: Heparin  8. GI prophylaxis: PPI per home  regimen  Patient continues to tolerate diet at lunch will discharge her to home finish up antibiotic as outpatient. She is for advised to follow-up with [facility]. regarding her chronic constipation. Did voice understanding.  CONSULTS OBTAINED:    DRUG ALLERGIES:   Allergies  Allergen Reactions  . Cranberry Hives    Cranberry juice  . Tape Rash    Can use paper tape    DISCHARGE MEDICATIONS:   Allergies as of 02/20/2018      Reactions   Cranberry Hives   Cranberry juice   Tape Rash   Can use paper tape      Medication List    STOP taking these medications   cyclobenzaprine 10 MG tablet Commonly known as:  FLEXERIL   hydrocortisone 25 MG suppository Commonly known as:  ANUSOL-HC     TAKE these medications   albuterol 108 (90 Base) MCG/ACT inhaler Commonly known as:  PROVENTIL HFA;VENTOLIN HFA Inhale 2 puffs into the lungs 2 (two) times daily.   albuterol 108 (90 Base) MCG/ACT inhaler Commonly known as:  PROVENTIL HFA;VENTOLIN HFA Inhale 2 puffs into the lungs 2 (two) times daily.   atorvastatin 80 MG tablet Commonly known as:  LIPITOR Take 1 tablet (80 mg total) by mouth daily at 6 PM.   budesonide-formoterol 160-4.5 MCG/ACT inhaler Commonly known as:  SYMBICORT Inhale 2 puffs into the lungs 2 (two) times daily.   buPROPion 200 MG 12 hr tablet Commonly known as:  WELLBUTRIN SR 1 tab daily for 1 week, then 1 tab BID What changed:    how much to take  how  to take this  when to take this  additional instructions   ciprofloxacin 500 MG tablet Commonly known as:  CIPRO Take 1 tablet (500 mg total) by mouth 2 (two) times daily.   FLUoxetine 20 MG tablet Commonly known as:  PROZAC Take 3 tablets (60 mg total) by mouth daily.   hydrochlorothiazide 25 MG tablet Commonly known as:  HYDRODIURIL Take 1 tablet (25 mg total) by mouth daily.   levothyroxine 25 MCG tablet Commonly known as:  SYNTHROID, LEVOTHROID Take 1 tablet (25 mcg total) by mouth daily at  6 (six) AM. Start taking on:  02/21/2018   lisinopril 10 MG tablet Commonly known as:  PRINIVIL,ZESTRIL Take 1 tablet (10 mg total) by mouth daily.   metoprolol tartrate 50 MG tablet Commonly known as:  LOPRESSOR Take 0.5 tablets (25 mg total) by mouth 2 (two) times daily.   metroNIDAZOLE 500 MG tablet Commonly known as:  FLAGYL Take 1 tablet (500 mg total) by mouth every 8 (eight) hours for 8 days.   mirabegron ER 50 MG Tb24 tablet Commonly known as:  MYRBETRIQ Take 1 tablet (50 mg total) by mouth daily.   omeprazole 20 MG capsule Commonly known as:  PRILOSEC Take 1 capsule (20 mg total) by mouth 2 (two) times daily before a meal.   ondansetron 4 MG disintegrating tablet Commonly known as:  ZOFRAN-ODT Take 1 tablet (4 mg total) by mouth every 8 (eight) hours as needed for nausea or vomiting.   ticagrelor 90 MG Tabs tablet Commonly known as:  BRILINTA Take 1 tablet (90 mg total) by mouth 2 (two) times daily.   traZODone 50 MG tablet Commonly known as:  DESYREL Take 1-2 tablets (50-100 mg total) by mouth at bedtime as needed for sleep.   valACYclovir 1000 MG tablet Commonly known as:  VALTREX Take 1 tablet (1,000 mg total) by mouth 2 (two) times daily.       If you experience worsening of your admission symptoms, develop shortness of breath, life threatening emergency, suicidal or homicidal thoughts you must seek medical attention immediately by calling 911 or calling your MD immediately  if symptoms less severe.  You Must read complete instructions/literature along with all the possible adverse reactions/side effects for all the Medicines you take and that have been prescribed to you. Take any new Medicines after you have completely understood and accept all the possible adverse reactions/side effects.   Please note  You were cared for by a hospitalist during your hospital stay. If you have any questions about your discharge medications or the care you received while  you were in the hospital after you are discharged, you can call the unit and asked to speak with the hospitalist on call if the hospitalist that took care of you is not available. Once you are discharged, your primary care physician will handle any further medical issues. Please note that NO REFILLS for any discharge medications will be authorized once you are discharged, as it is imperative that you return to your primary care physician (or establish a relationship with a primary care physician if you do not have one) for your aftercare needs so that they can reassess your need for medications and monitor your lab values. Today   SUBJECTIVE   Headache this morning. No vomiting. Chronic nausea  VITAL SIGNS:  Blood pressure 125/66, pulse 64, temperature 98.1 F (36.7 C), temperature source Oral, resp. rate 19, height 5\' 5"  (1.651 m), weight 64.1 kg, SpO2 97 %.  I/O:  Intake/Output Summary (Last 24 hours) at 02/20/2018 1206 Last data filed at 02/20/2018 0352 Gross per 24 hour  Intake 2388.48 ml  Output 1600 ml  Net 788.48 ml    PHYSICAL EXAMINATION:  GENERAL:  57 y.o.-year-old patient lying in the bed with no acute distress.  EYES: Pupils equal, round, reactive to light and accommodation. No scleral icterus. Extraocular muscles intact.  HEENT: Head atraumatic, normocephalic. Oropharynx and nasopharynx clear.  NECK:  Supple, no jugular venous distention. No thyroid enlargement, no tenderness.  LUNGS: Normal breath sounds bilaterally, no wheezing, rales,rhonchi or crepitation. No use of accessory muscles of respiration.  CARDIOVASCULAR: S1, S2 normal. No murmurs, rubs, or gallops.  ABDOMEN: Soft, non-tender, non-distended. Bowel sounds present. No organomegaly or mass.  EXTREMITIES: No pedal edema, cyanosis, or clubbing.  NEUROLOGIC: Cranial nerves II through XII are intact. Muscle strength 5/5 in all extremities. Sensation intact. Gait not checked.  PSYCHIATRIC: The patient is alert and  oriented x 3.  SKIN: No obvious rash, lesion, or ulcer.   DATA REVIEW:   CBC  Recent Labs  Lab 02/18/18 1908  WBC 12.6*  HGB 12.6  HCT 38.6  PLT 249    Chemistries  Recent Labs  Lab 02/18/18 1908 02/20/18 0541  NA 136 139  K 4.1 3.9  CL 107 112*  CO2 23 22  GLUCOSE 127* 93  BUN 36* 21*  CREATININE 2.27* 1.20*  CALCIUM 8.8* 8.1*  AST 17  --   ALT 11  --   ALKPHOS 123  --   BILITOT 0.7  --     Microbiology Results   No results found for this or any previous visit (from the past 240 hour(s)).  RADIOLOGY:  Ct Abdomen Pelvis Wo Contrast  Result Date: 02/18/2018 CLINICAL DATA:  Lower right abdominal pain and vomiting x3. EXAM: CT ABDOMEN AND PELVIS WITHOUT CONTRAST TECHNIQUE: Multidetector CT imaging of the abdomen and pelvis was performed following the standard protocol without IV contrast. COMPARISON:  None. FINDINGS: Lower chest: Lung bases are normal. Calcified plaque over the right coronary artery. Hepatobiliary: Previous cholecystectomy. The liver and biliary tree are otherwise normal. Pancreas: Normal. Spleen: Normal. Adrenals/Urinary Tract: Adrenal glands are normal. Kidneys normal in size with mild bilateral nephrolithiasis. Slight decrease in size of the right kidney. No hydronephrosis. Ureters and bladder are normal. Stomach/Bowel: Stomach and small bowel are normal. Appendix is normal. There are patchy areas of mild wall thickening involving the colon most prominent over the distal descending/sigmoid colon which may be due to an acute colitis of infectious or inflammatory nature. There is no significant adjacent free fluid or inflammatory change. Vascular/Lymphatic: Minimal calcified plaque over the abdominal aorta. No significant adenopathy. Reproductive: Within normal.  Evidence of previous tubal ligation. Other: None. Musculoskeletal: Minimal degenerate change of the spine. IMPRESSION: Patchy mild wall thickening of the colon worse over the distal descending/sigmoid  colon which may be seen with an acute colitis of infectious or inflammatory nature. Mild bilateral nephrolithiasis.  No ureteral stones or obstruction. Aortic Atherosclerosis (ICD10-I70.0). Electronically Signed   By: Elberta Fortis M.D.   On: 02/18/2018 22:32     Management plans discussed with the patient, family and they are in agreement.  CODE STATUS:     Code Status Orders  (From admission, onward)         Start     Ordered   02/19/18 0812  Full code  Continuous     02/19/18 0812        Code Status  History    Date Active Date Inactive Code Status Order ID Comments User Context   09/27/2015 1646 09/29/2015 1552 Full Code [id number]  Alwyn Pea, MD Inpatient      TOTAL TIME TAKING CARE OF THIS PATIENT: *40* minutes.    Enedina Finner M.D on 02/20/2018 at 12:06 PM  Between 7am to 6pm - Pager - [phone] After 6pm go to [email or web address] - Social research officer, government  [facility]  Office  [phone]  CC: Primary care physician; Natalie Carrow, DO

## 2018-02-24 ENCOUNTER — Telehealth: Payer: Self-pay | Admitting: Family Medicine

## 2018-02-24 ENCOUNTER — Telehealth: Payer: Self-pay

## 2018-02-24 DIAGNOSIS — Z5181 Encounter for therapeutic drug level monitoring: Secondary | ICD-10-CM | POA: Diagnosis not present

## 2018-02-24 NOTE — Telephone Encounter (Signed)
Copied from CRM [id number]. Topic: Quick Communication - See Telephone Encounter >> Feb 24, 2018  9:19 AM Herby Abraham C wrote: CRM for notification. See Telephone encounter for: 02/24/18.  [facility] is calling in to request a copy of pt's medication list.   Fax: [phone] Phone: [phone] -Synetta Fail

## 2018-02-24 NOTE — Telephone Encounter (Signed)
Flagged on EMMI report for having unfilled RXs, not being able to fill today or tomorrow, and having other questions or problems.  First attempt to reach patient made, however unable to reach patient.  Left voicemail encouraging callback. Will attempt at later time.

## 2018-02-24 NOTE — Telephone Encounter (Signed)
Med list faxed and records release for other records has been placed in the folder for [facility].

## 2018-03-02 DIAGNOSIS — Z5181 Encounter for therapeutic drug level monitoring: Secondary | ICD-10-CM | POA: Diagnosis not present

## 2018-03-02 NOTE — Telephone Encounter (Signed)
Reached upon second attempt.  She was able to get her prescriptions filled.  Having some issues still with going to the bathroom, however took two dulcolax pills which seemed to help.  Encouraged her to talk with her PCP about her issue if dulcolax didn't resolve things.  No other questions or concerns.  I thanked her for her time.

## 2018-03-04 ENCOUNTER — Encounter: Payer: Self-pay | Admitting: Family Medicine

## 2018-03-04 ENCOUNTER — Ambulatory Visit (INDEPENDENT_AMBULATORY_CARE_PROVIDER_SITE_OTHER): Payer: Medicaid Other | Admitting: Family Medicine

## 2018-03-04 VITALS — BP 102/68 | HR 68 | Temp 97.5°F | Wt 140.8 lb

## 2018-03-04 DIAGNOSIS — K529 Noninfective gastroenteritis and colitis, unspecified: Secondary | ICD-10-CM | POA: Diagnosis not present

## 2018-03-04 DIAGNOSIS — N3000 Acute cystitis without hematuria: Secondary | ICD-10-CM | POA: Diagnosis not present

## 2018-03-04 DIAGNOSIS — N179 Acute kidney failure, unspecified: Secondary | ICD-10-CM | POA: Diagnosis not present

## 2018-03-04 DIAGNOSIS — R8281 Pyuria: Secondary | ICD-10-CM | POA: Diagnosis not present

## 2018-03-04 DIAGNOSIS — Z23 Encounter for immunization: Secondary | ICD-10-CM | POA: Diagnosis not present

## 2018-03-04 DIAGNOSIS — K5909 Other constipation: Secondary | ICD-10-CM | POA: Diagnosis not present

## 2018-03-04 DIAGNOSIS — Z596 Low income: Secondary | ICD-10-CM | POA: Diagnosis not present

## 2018-03-04 LAB — MICROSCOPIC EXAMINATION

## 2018-03-04 LAB — UA/M W/RFLX CULTURE, ROUTINE
Bilirubin, UA: NEGATIVE
Glucose, UA: NEGATIVE
Ketones, UA: NEGATIVE
Nitrite, UA: NEGATIVE
RBC, UA: NEGATIVE
Specific Gravity, UA: 1.02 (ref 1.005–1.030)
Urobilinogen, Ur: 0.2 mg/dL (ref 0.2–1.0)
pH, UA: 5.5 (ref 5.0–7.5)

## 2018-03-04 MED ORDER — LINACLOTIDE 145 MCG PO CAPS: 145.0000 ug | ORAL_CAPSULE | Freq: Every day | ORAL | 3 refills | Status: DC

## 2018-03-04 NOTE — Assessment & Plan Note (Signed)
Has failed ducolax and miralax. Only going 1x every 6 days, usually going 1x every 2 weeks- start linzess and recheck 1 month. Call with any concerns.

## 2018-03-04 NOTE — Assessment & Plan Note (Signed)
Rechecking levels today. Await results.  

## 2018-03-04 NOTE — Patient Instructions (Signed)

## 2018-03-04 NOTE — Progress Notes (Signed)
BP 102/68   Pulse 68   Temp (!) 97.5 F (36.4 C) (Oral)   Wt 140 lb 12.8 oz (63.9 kg)   SpO2 99%   BMI 23.43 kg/m    Subjective:    Patient ID: Natalie Taylor, female    DOB: 06/09/1960, 57 y.o.   MRN: [id number]  HPI: Natalie Taylor is a 57 y.o. female  Chief Complaint  Patient presents with  . Hospitalization Follow-up   Transition of Care Hospital Follow up.   Hospital/[facility] Physician: Dr. Allena KatzPatel D/C Date: 02/20/18  Records Requested: 03/04/18 Records Received: 03/04/18 Records Reviewed: 03/04/18  Diagnoses on Discharge: Colitis, UTI  Date of interactive Contact within 48 hours of discharge: N/A Contact was through: Call not made  Date of 7 day or 14 day face-to-face visit: 03/04/18   within 14 days  Outpatient Encounter Medications as of 03/04/2018  Medication Sig  . albuterol (PROVENTIL HFA;VENTOLIN HFA) 108 (90 Base) MCG/ACT inhaler Inhale 2 puffs into the lungs 2 (two) times daily.  Marland Kitchen. atorvastatin (LIPITOR) 80 MG tablet Take 1 tablet (80 mg total) by mouth daily at 6 PM.  . budesonide-formoterol (SYMBICORT) 160-4.5 MCG/ACT inhaler Inhale 2 puffs into the lungs 2 (two) times daily.  Marland Kitchen. buPROPion (WELLBUTRIN SR) 200 MG 12 hr tablet 1 tab daily for 1 week, then 1 tab BID (Patient taking differently: Take 200 mg by mouth 2 (two) times daily. )  . FLUoxetine (PROZAC) 20 MG tablet Take 3 tablets (60 mg total) by mouth daily.  . hydrochlorothiazide (HYDRODIURIL) 25 MG tablet Take 1 tablet (25 mg total) by mouth daily.  Marland Kitchen. levothyroxine (SYNTHROID, LEVOTHROID) 25 MCG tablet Take 1 tablet (25 mcg total) by mouth daily at 6 (six) AM.  . lisinopril (PRINIVIL,ZESTRIL) 10 MG tablet Take 1 tablet (10 mg total) by mouth daily.  . metoprolol tartrate (LOPRESSOR) 50 MG tablet Take 0.5 tablets (25 mg total) by mouth 2 (two) times daily.  . mirabegron ER (MYRBETRIQ) 50 MG TB24 tablet Take 1 tablet (50 mg total) by mouth daily.  Marland Kitchen. omeprazole (PRILOSEC) 20 MG capsule  Take 1 capsule (20 mg total) by mouth 2 (two) times daily before a meal.  . ondansetron (ZOFRAN ODT) 4 MG disintegrating tablet Take 1 tablet (4 mg total) by mouth every 8 (eight) hours as needed for nausea or vomiting.  . ticagrelor (BRILINTA) 90 MG TABS tablet Take 1 tablet (90 mg total) by mouth 2 (two) times daily.  . traZODone (DESYREL) 50 MG tablet Take 1-2 tablets (50-100 mg total) by mouth at bedtime as needed for sleep.  . valACYclovir (VALTREX) 1000 MG tablet Take 1 tablet (1,000 mg total) by mouth 2 (two) times daily.  Marland Kitchen. linaclotide (LINZESS) 145 MCG CAPS capsule Take 1 capsule (145 mcg total) by mouth daily before breakfast.  . [DISCONTINUED] albuterol (PROVENTIL HFA;VENTOLIN HFA) 108 (90 Base) MCG/ACT inhaler Inhale 2 puffs into the lungs 2 (two) times daily.  . [DISCONTINUED] ciprofloxacin (CIPRO) 500 MG tablet Take 1 tablet (500 mg total) by mouth 2 (two) times daily.   No facility-administered encounter medications on file as of 03/04/2018.    Hospital Course per hospitalist:  "HOSPITAL COURSE:   Natalie Christeneggy C Wallaceis a 57 y.o.femalepatient complains of severe left lower quadrant pain . Is been going on for 2 days getting worse.  1.AcuteSigmoid Colitis: unable to Obtain GI panel-- has not has BM for more than 1 week -Continue antibioticsIV Cipro and Flagyl-- change to oral antibiotic. Patient able to  tolerate soft diet.  2. Acute kidney injury: Secondary to dehydration due to above. Hydrate with intravenous fluid. Avoid nephrotoxic agents. -Came in with creatinine of 2.27-- 1.2 -baseline creatinine0.9 -advised to continue oral hydration  3. Coronary artery disease: Stable; continue Brilinta. As any chest pain  4. Hypertension: Controlled; continue hydrochlorothiazide, lisinopril and metoprolol.  5.Hyperlipidemia: Continue statin therapy for cardiovascular risk reduction  6. COPD: Stable; continue inhaled corticosteroid. Albuterol as needed.  7.  DVT prophylaxis: Heparin  8. GI prophylaxis: PPI per home regimen"   Diagnostic Tests Reviewed:  CLINICAL DATA:  Lower right abdominal pain and vomiting x3.  EXAM: CT ABDOMEN AND PELVIS WITHOUT CONTRAST  TECHNIQUE: Multidetector CT imaging of the abdomen and pelvis was performed following the standard protocol without IV contrast.  COMPARISON:  None.  FINDINGS: Lower chest: Lung bases are normal. Calcified plaque over the right coronary artery.  Hepatobiliary: Previous cholecystectomy. The liver and biliary tree are otherwise normal.  Pancreas: Normal.  Spleen: Normal.  Adrenals/Urinary Tract: Adrenal glands are normal. Kidneys normal in size with mild bilateral nephrolithiasis. Slight decrease in size of the right kidney. No hydronephrosis. Ureters and bladder are normal.  Stomach/Bowel: Stomach and small bowel are normal. Appendix is normal. There are patchy areas of mild wall thickening involving the colon most prominent over the distal descending/sigmoid colon which may be due to an acute colitis of infectious or inflammatory nature. There is no significant adjacent free fluid or inflammatory change.  Vascular/Lymphatic: Minimal calcified plaque over the abdominal aorta. No significant adenopathy.  Reproductive: Within normal.  Evidence of previous tubal ligation.  Other: None.  Musculoskeletal: Minimal degenerate change of the spine.  IMPRESSION: Patchy mild wall thickening of the colon worse over the distal descending/sigmoid colon which may be seen with an acute colitis of infectious or inflammatory nature.  Mild bilateral nephrolithiasis.  No ureteral stones or obstruction.  Aortic Atherosclerosis (ICD10-I70.0).  Disposition: Home  Consults: None  Discharge Instructions: Follow up here and with GI for chronic constipation  Disease/illness Education: Given in writing  Home Health/Community Services Discussions/Referrals:  N/A  Establishment or re-establishment of referral orders for community resources: N/A  Discussion with other health care providers: None  Assessment and Support of treatment regimen adherence: Fair  Appointments Coordinated with: Patient  Natalie Taylor notes that she has been very constipated. She has been taking miralax and ducolax without benefit. Has gone 2x since she got out of the hospital. She has been having a BM 1x every 6 days, has been going 1x every 2 weeks  Relevant past medical, surgical, family and social history reviewed and updated as indicated. Interim medical history since our last visit reviewed. Allergies and medications reviewed and updated.  Review of Systems  Constitutional: Negative.   Respiratory: Negative.   Cardiovascular: Negative.   Gastrointestinal: Positive for abdominal distention, abdominal pain and constipation. Negative for anal bleeding, blood in stool, diarrhea, nausea, rectal pain and vomiting.  Musculoskeletal: Positive for back pain and myalgias. Negative for arthralgias, gait problem, joint swelling, neck pain and neck stiffness.  Skin: Negative.   Neurological: Negative.   Psychiatric/Behavioral: Positive for dysphoric mood. Negative for agitation, behavioral problems, confusion, decreased concentration, hallucinations, self-injury, sleep disturbance and suicidal ideas. The patient is nervous/anxious. The patient is not hyperactive.     Per HPI unless specifically indicated above     Objective:    BP 102/68   Pulse 68   Temp (!) 97.5 F (36.4 C) (Oral)   Wt 140 lb 12.8 oz (  63.9 kg)   SpO2 99%   BMI 23.43 kg/m   Wt Readings from Last 3 Encounters:  03/04/18 140 lb 12.8 oz (63.9 kg)  02/20/18 141 lb 4.8 oz (64.1 kg)  11/30/17 146 lb 9 oz (66.5 kg)    Physical Exam  Constitutional: She is oriented to person, place, and time. She appears well-developed and well-nourished. No distress.  HENT:  Head: Normocephalic and atraumatic.  Right  Ear: Hearing normal.  Left Ear: Hearing normal.  Nose: Nose normal.  Eyes: Conjunctivae and lids are normal. Right eye exhibits no discharge. Left eye exhibits no discharge. No scleral icterus.  Cardiovascular: Normal rate, regular rhythm and intact distal pulses. Exam reveals no gallop and no friction rub.  Murmur heard. Pulmonary/Chest: Effort normal and breath sounds normal. No stridor. No respiratory distress. She has no wheezes. She has no rales. She exhibits no tenderness.  Abdominal: Soft. Bowel sounds are normal. She exhibits no distension and no mass. There is tenderness in the left lower quadrant. There is no rebound and no guarding. No hernia.  Musculoskeletal: Normal range of motion. She exhibits no edema, tenderness or deformity.  Neurological: She is alert and oriented to person, place, and time.  Skin: Skin is warm, dry and intact. Capillary refill takes less than 2 seconds. No rash noted. She is not diaphoretic. No erythema. No pallor.  Psychiatric: She has a normal mood and affect. Her speech is normal and behavior is normal. Judgment and thought content normal. Cognition and memory are normal.  Nursing note and vitals reviewed.   Results for orders placed or performed during the hospital encounter of 02/18/18  Lipase, blood  Result Value Ref Range   Lipase 44 11 - 51 U/L  Comprehensive metabolic panel  Result Value Ref Range   Sodium 136 135 - 145 mmol/L   Potassium 4.1 3.5 - 5.1 mmol/L   Chloride 107 98 - 111 mmol/L   CO2 23 22 - 32 mmol/L   Glucose, Bld 127 (H) 70 - 99 mg/dL   BUN 36 (H) 6 - 20 mg/dL   Creatinine, Ser 6.57 (H) 0.44 - 1.00 mg/dL   Calcium 8.8 (L) 8.9 - 10.3 mg/dL   Total Protein 7.5 6.5 - 8.1 g/dL   Albumin 3.9 3.5 - 5.0 g/dL   AST 17 15 - 41 U/L   ALT 11 0 - 44 U/L   Alkaline Phosphatase 123 38 - 126 U/L   Total Bilirubin 0.7 0.3 - 1.2 mg/dL   GFR calc non Af Amer 23 (L) >60 mL/min   GFR calc Af Amer 27 (L) >60 mL/min   Anion gap 6 5 - 15   CBC  Result Value Ref Range   WBC 12.6 (H) 4.0 - 10.5 K/uL   RBC 4.25 3.87 - 5.11 MIL/uL   Hemoglobin 12.6 12.0 - 15.0 g/dL   HCT 84.6 96.2 - 95.2 %   MCV 90.8 80.0 - 100.0 fL   MCH 29.6 26.0 - 34.0 pg   MCHC 32.6 30.0 - 36.0 g/dL   RDW 84.1 32.4 - 40.1 %   Platelets 249 150 - 400 K/uL   nRBC 0.0 0.0 - 0.2 %  Urinalysis, Complete w Microscopic  Result Value Ref Range   Color, Urine YELLOW (A) YELLOW   APPearance CLOUDY (A) CLEAR   Specific Gravity, Urine 1.011 1.005 - 1.030   pH 5.0 5.0 - 8.0   Glucose, UA NEGATIVE NEGATIVE mg/dL   Hgb urine dipstick NEGATIVE NEGATIVE  Bilirubin Urine NEGATIVE NEGATIVE   Ketones, ur NEGATIVE NEGATIVE mg/dL   Protein, ur 30 (A) NEGATIVE mg/dL   Nitrite POSITIVE (A) NEGATIVE   Leukocytes, UA LARGE (A) NEGATIVE   RBC / HPF 0-5 0 - 5 RBC/hpf   WBC, UA >50 (H) 0 - 5 WBC/hpf   Bacteria, UA MANY (A) NONE SEEN   Squamous Epithelial / LPF 11-20 0 - 5   WBC Clumps PRESENT    Mucus PRESENT    Hyaline Casts, UA PRESENT   Pregnancy, urine  Result Value Ref Range   Preg Test, Ur NEGATIVE NEGATIVE  TSH  Result Value Ref Range   TSH 7.821 (H) 0.350 - 4.500 uIU/mL  Hemoglobin A1c  Result Value Ref Range   Hgb A1c MFr Bld 5.3 4.8 - 5.6 %   Mean Plasma Glucose 105.41 mg/dL  Basic metabolic panel  Result Value Ref Range   Sodium 139 135 - 145 mmol/L   Potassium 3.9 3.5 - 5.1 mmol/L   Chloride 112 (H) 98 - 111 mmol/L   CO2 22 22 - 32 mmol/L   Glucose, Bld 93 70 - 99 mg/dL   BUN 21 (H) 6 - 20 mg/dL   Creatinine, Ser 1.61 (H) 0.44 - 1.00 mg/dL   Calcium 8.1 (L) 8.9 - 10.3 mg/dL   GFR calc non Af Amer 50 (L) >60 mL/min   GFR calc Af Amer 58 (L) >60 mL/min   Anion gap 5 5 - 15  Glucose, capillary  Result Value Ref Range   Glucose-Capillary 83 70 - 99 mg/dL      Assessment & Plan:   Problem List Items Addressed This Visit      Digestive   Chronic constipation    Has failed ducolax and miralax. Only going 1x every 6 days, usually going 1x  every 2 weeks- start linzess and recheck 1 month. Call with any concerns.       Relevant Orders   Ambulatory referral to Gastroenterology     Genitourinary   AKI (acute kidney injury) (HCC)    Rechecking levels today. Await results.       Relevant Orders   Basic metabolic panel    Other Visit Diagnoses    Acute colitis    -  Primary   Belly feeling better except for constipation. CBC pending. Will treat constipation and get her into see GI. Start Linzess. Call with any concerns.    Relevant Orders   CBC with Differential/Platelet   Ambulatory referral to Gastroenterology   Need for influenza vaccination       Flu shot given today.   Relevant Orders   Flu Vaccine QUAD 36+ mos IM (Completed)   Acute cystitis without hematuria       Rechecking urine today. 1+ leuks- will check culture, await results. Call with any concerns.    Relevant Orders   UA/M w/rflx Culture, Routine   Pyuria       Relevant Orders   Urine Culture       Follow up plan: Return in about 4 weeks (around 04/01/2018).  40 minute visit today with >50% spent in counseling and coordination of care.

## 2018-03-05 ENCOUNTER — Telehealth: Payer: Self-pay | Admitting: Family Medicine

## 2018-03-05 DIAGNOSIS — N289 Disorder of kidney and ureter, unspecified: Secondary | ICD-10-CM

## 2018-03-05 LAB — BASIC METABOLIC PANEL
BUN/Creatinine Ratio: 17 (ref 9–23)
BUN: 22 mg/dL (ref 6–24)
CO2: 20 mmol/L (ref 20–29)
Calcium: 9.2 mg/dL (ref 8.7–10.2)
Chloride: 104 mmol/L (ref 96–106)
Creatinine, Ser: 1.32 mg/dL — ABNORMAL HIGH (ref 0.57–1.00)
GFR calc Af Amer: 52 mL/min/{1.73_m2} — ABNORMAL LOW (ref >59)
GFR calc non Af Amer: 45 mL/min/{1.73_m2} — ABNORMAL LOW (ref >59)
Glucose: 114 mg/dL — ABNORMAL HIGH (ref 65–99)
Potassium: 4.7 mmol/L (ref 3.5–5.2)
Sodium: 141 mmol/L (ref 134–144)

## 2018-03-05 LAB — CBC WITH DIFFERENTIAL/PLATELET
Basophils Absolute: 0.1 10*3/uL (ref 0.0–0.2)
Basos: 1 %
EOS (ABSOLUTE): 0.3 10*3/uL (ref 0.0–0.4)
Eos: 3 %
Hematocrit: 40.2 % (ref 34.0–46.6)
Hemoglobin: 13.4 g/dL (ref 11.1–15.9)
Immature Grans (Abs): 0 10*3/uL (ref 0.0–0.1)
Immature Granulocytes: 0 %
Lymphocytes Absolute: 2.5 10*3/uL (ref 0.7–3.1)
Lymphs: 32 %
MCH: 30.8 pg (ref 26.6–33.0)
MCHC: 33.3 g/dL (ref 31.5–35.7)
MCV: 92 fL (ref 79–97)
Monocytes Absolute: 0.5 10*3/uL (ref 0.1–0.9)
Monocytes: 7 %
Neutrophils Absolute: 4.4 10*3/uL (ref 1.4–7.0)
Neutrophils: 57 %
Platelets: 280 10*3/uL (ref 150–450)
RBC: 4.35 x10E6/uL (ref 3.77–5.28)
RDW: 15.1 % (ref 12.3–15.4)
WBC: 7.7 10*3/uL (ref 3.4–10.8)

## 2018-03-05 NOTE — Telephone Encounter (Signed)
Patient notified and verbalized understanding. 

## 2018-03-05 NOTE — Telephone Encounter (Signed)
Please let Natalie Taylor know that her labs look OK, but her kidney function has gotten a little worse. I'd like her to really push her fluids and come back in next week to recheck her kidney function with a lab visit. Orders in. Call with any concerns.

## 2018-03-06 LAB — URINE CULTURE

## 2018-03-11 ENCOUNTER — Other Ambulatory Visit: Payer: Self-pay | Admitting: Family Medicine

## 2018-03-11 NOTE — Telephone Encounter (Signed)
Requested medication (s) are due for refill today: yes  Requested medication (s) are on the active medication list: yes  Last refill:  02/20/18#20  Future visit scheduled: yes  Notes to clinic:  Unable to refill per protocol    Requested Prescriptions  Pending Prescriptions Disp Refills   ondansetron (ZOFRAN-ODT) 4 MG disintegrating tablet [Pharmacy Med Name: ONDANSETRON ODT 4 MG TABLET] 30 tablet 0    Sig: Take 1 tablet (4 mg total) by mouth every 8 (eight) hours as needed for nausea or vomiting.     Not Delegated - Gastroenterology: Antiemetics Failed - 03/11/2018 12:29 PM      Failed - This refill cannot be delegated      Passed - Valid encounter within last 6 months    Recent Outpatient Visits          1 week ago Acute colitis   [facility] [place] [place], [place]   3 months ago Chronic bilateral low back pain with bilateral sciatica   [facility] [place] [place] P, [facility]   5 months ago Other symptoms and signs involving the nervous system   [facility] [place] [place]   10 months ago Benign hypertensive renal disease   [facility] [place], [place]   1 year ago Chronic obstructive pulmonary disease with acute exacerbation (HCC)   [facility] [place], Megan P, DO      Future Appointments            In 3 weeks Laural BenesJohnson, Oralia RudMegan P, DO [facility]

## 2018-03-29 ENCOUNTER — Other Ambulatory Visit: Payer: Self-pay

## 2018-03-29 NOTE — Telephone Encounter (Signed)
Needs to come in for thyroid labs 

## 2018-03-29 NOTE — Telephone Encounter (Signed)
Left message on machine for pt to return call to the office. CRM created.  

## 2018-03-30 ENCOUNTER — Other Ambulatory Visit: Payer: Self-pay | Admitting: Family Medicine

## 2018-03-30 NOTE — Telephone Encounter (Signed)
Called and left patient a VM asking for her to please return my call.  

## 2018-03-31 NOTE — Telephone Encounter (Signed)
Multiple attempts to contact patient.  Send letter to come in for labs?

## 2018-03-31 NOTE — Telephone Encounter (Signed)
Called and left patient a VM asking for her to please return my call.  

## 2018-04-01 NOTE — Telephone Encounter (Signed)
Requested medication (s) are due for refill today: Yes  Requested medication (s) are on the active medication list: Yes  Last refill:  02/21/18  Future visit scheduled: No  Notes to clinic:  Unable to refill per protocol, elevated TSH not addressed.     Requested Prescriptions  Pending Prescriptions Disp Refills   levothyroxine (SYNTHROID, LEVOTHROID) 25 MCG tablet [Pharmacy Med Name: LEVOTHYROXINE 25 MCG TABLET] 30 tablet 0    Sig: Take 1 tablet (25 mcg total) by mouth daily at 6 (six) AM.     Endocrinology:  Hypothyroid Agents Failed - 03/30/2018  3:01 PM      Failed - TSH needs to be rechecked within 3 months after an abnormal result. Refill until TSH is due.      Failed - TSH in normal range and within 360 days    TSH  Date Value Ref Range Status  02/18/2018 7.821 (H) 0.350 - 4.500 uIU/mL Final    Comment:    Performed by a 3rd Generation assay with a functional sensitivity of <=0.01 uIU/mL. Performed at [facility], [address] [place], [place] [address]   09/28/2017 4.360 0.450 - 4.500 uIU/mL Final         Passed - Valid encounter within last 12 months    Recent Outpatient Visits          4 weeks ago Acute colitis   [facility] [place] [place]   4 months ago Chronic bilateral low back pain with bilateral sciatica   [facility] [place], Megan P, DO   6 months ago Other symptoms and signs involving the nervous system   [facility] [place] [place]   11 months ago Benign hypertensive renal disease   [facility] [place], [place]   1 year ago Chronic obstructive pulmonary disease with acute exacerbation (HCC)   [facility] [place] [place] P, [place]

## 2018-04-02 ENCOUNTER — Ambulatory Visit: Payer: Medicaid Other | Admitting: Family Medicine

## 2018-04-05 NOTE — Telephone Encounter (Signed)
Letter sent to patient in regards to schedule an appt asap.

## 2018-04-29 ENCOUNTER — Telehealth: Payer: Self-pay | Admitting: Family Medicine

## 2018-04-29 ENCOUNTER — Encounter: Payer: Self-pay | Admitting: Family Medicine

## 2018-04-29 ENCOUNTER — Ambulatory Visit (INDEPENDENT_AMBULATORY_CARE_PROVIDER_SITE_OTHER): Payer: Medicaid Other | Admitting: Family Medicine

## 2018-04-29 VITALS — BP 92/57 | HR 72 | Temp 98.0°F | Ht 65.0 in | Wt 135.5 lb

## 2018-04-29 DIAGNOSIS — J441 Chronic obstructive pulmonary disease with (acute) exacerbation: Secondary | ICD-10-CM | POA: Diagnosis not present

## 2018-04-29 DIAGNOSIS — Z596 Low income: Secondary | ICD-10-CM | POA: Diagnosis not present

## 2018-04-29 DIAGNOSIS — K5909 Other constipation: Secondary | ICD-10-CM

## 2018-04-29 DIAGNOSIS — I959 Hypotension, unspecified: Secondary | ICD-10-CM

## 2018-04-29 MED ORDER — AMOXICILLIN-POT CLAVULANATE 875-125 MG PO TABS: 1.0000 | ORAL_TABLET | Freq: Two times a day (BID) | ORAL | 0 refills | Status: DC

## 2018-04-29 MED ORDER — TRIAMCINOLONE ACETONIDE 0.5 % EX OINT: 1.0000 "application " | TOPICAL_OINTMENT | Freq: Two times a day (BID) | CUTANEOUS | 3 refills | Status: AC

## 2018-04-29 MED ORDER — PREDNISONE 10 MG PO TABS: ORAL_TABLET | ORAL | 0 refills | Status: DC

## 2018-04-29 NOTE — Progress Notes (Signed)
BP (!) 92/57 (BP Location: Right Arm, Patient Position: Sitting, Cuff Size: Normal)   Pulse 72   Temp 98 F (36.7 C) (Oral)   Ht 5\' 5"  (1.651 m)   Wt 135 lb 8 oz (61.5 kg)   SpO2 99%   BMI 22.55 kg/m    Subjective:    Patient ID: Natalie Taylor, female    DOB: [birth year], 58 y.o.   MRN: [id number]  HPI: Natalie Taylor is a 58 y.o. female  Chief Complaint  Patient presents with  . Acute Colitis  . Constipation    Unable to afford Linzess. Still constipated.   Marland Kitchen Headache    Presistant.   . Cough    Productive, ongoing 1 week.    Couldn't afford her linzess for refills- did try it and it really helped at first. Due to see GI next week.  UPPER RESPIRATORY TRACT INFECTION Duration: 1+ weeks Worst symptom: cough Fever: yes Cough: yes Shortness of breath: yes Wheezing: yes Chest pain: yes, with cough Chest tightness: yes Chest congestion: yes Nasal congestion: no Runny nose: yes Post nasal drip: yes Sneezing: yes Sore throat: yes Swollen glands: no Sinus pressure: yes Headache: yes Face pain: yes Toothache: yes Ear pain: no  Ear pressure: no  Eyes red/itching:yes Eye drainage/crusting: no  Vomiting: yes Rash: yes Fatigue: yes Sick contacts: yes Strep contacts: no  Context: better Recurrent sinusitis: no Relief with OTC cold/cough medications: no  Treatments attempted: none   Relevant past medical, surgical, family and social history reviewed and updated as indicated. Interim medical history since our last visit reviewed. Allergies and medications reviewed and updated.  Review of Systems  Constitutional: Positive for fatigue and fever. Negative for activity change, appetite change, chills, diaphoresis and unexpected weight change.  HENT: Negative.   Respiratory: Positive for cough, shortness of breath and wheezing. Negative for apnea, choking, chest tightness and stridor.   Cardiovascular: Negative.   Gastrointestinal: Positive for abdominal pain,  constipation and nausea. Negative for abdominal distention, anal bleeding, blood in stool, diarrhea, rectal pain and vomiting.  Musculoskeletal: Negative.   Skin: Negative.   Psychiatric/Behavioral: Negative.     Per HPI unless specifically indicated above     Objective:    BP (!) 92/57 (BP Location: Right Arm, Patient Position: Sitting, Cuff Size: Normal)   Pulse 72   Temp 98 F (36.7 C) (Oral)   Ht 5\' 5"  (1.651 m)   Wt 135 lb 8 oz (61.5 kg)   SpO2 99%   BMI 22.55 kg/m   Wt Readings from Last 3 Encounters:  04/29/18 135 lb 8 oz (61.5 kg)  03/04/18 140 lb 12.8 oz (63.9 kg)  02/20/18 141 lb 4.8 oz (64.1 kg)    Physical Exam Vitals signs and nursing note reviewed.  Constitutional:      General: She is not in acute distress.    Appearance: Normal appearance. She is not ill-appearing, toxic-appearing or diaphoretic.  HENT:     Head: Normocephalic and atraumatic.     Right Ear: External ear normal.     Left Ear: External ear normal.     Nose: Nose normal.     Mouth/Throat:     Mouth: Mucous membranes are moist.     Pharynx: Oropharynx is clear.  Eyes:     General: No scleral icterus.       Right eye: No discharge.        Left eye: No discharge.     Extraocular Movements: Extraocular  movements intact.     Conjunctiva/sclera: Conjunctivae normal.     Pupils: Pupils are equal, round, and reactive to light.  Neck:     Musculoskeletal: Normal range of motion and neck supple.  Cardiovascular:     Rate and Rhythm: Normal rate and regular rhythm.     Pulses: Normal pulses.     Heart sounds: Normal heart sounds. No murmur. No friction rub. No gallop.   Pulmonary:     Effort: Pulmonary effort is normal. No respiratory distress.     Breath sounds: No stridor. Wheezing present. No rhonchi or rales.  Chest:     Chest wall: No tenderness.  Musculoskeletal: Normal range of motion.  Skin:    General: Skin is warm and dry.     Capillary Refill: Capillary refill takes less than 2  seconds.     Coloration: Skin is not jaundiced or pale.     Findings: No bruising, erythema, lesion or rash.  Neurological:     General: No focal deficit present.     Mental Status: She is alert and oriented to person, place, and time. Mental status is at baseline.  Psychiatric:        Mood and Affect: Mood normal.        Behavior: Behavior normal.        Thought Content: Thought content normal.        Judgment: Judgment normal.     Results for orders placed or performed in visit on 03/04/18  Urine Culture  Result Value Ref Range   Urine Culture, Routine Final report    Organism ID, Bacteria Comment   Microscopic Examination  Result Value Ref Range   WBC, UA 11-30 (A) 0 - 5 /hpf   RBC, UA 0-2 0 - 2 /hpf   Epithelial Cells (non renal) 0-10 0 - 10 /hpf   Renal Epithel, UA 0-10 (A) None seen /hpf   Bacteria, UA Few None seen/Few  CBC with Differential/Platelet  Result Value Ref Range   WBC 7.7 3.4 - 10.8 x10E3/uL   RBC 4.35 3.77 - 5.28 x10E6/uL   Hemoglobin 13.4 11.1 - 15.9 g/dL   Hematocrit 74.2 59.5 - 46.6 %   MCV 92 79 - 97 fL   MCH 30.8 26.6 - 33.0 pg   MCHC 33.3 31.5 - 35.7 g/dL   RDW 63.8 75.6 - 43.3 %   Platelets 280 150 - 450 x10E3/uL   Neutrophils 57 Not Estab. %   Lymphs 32 Not Estab. %   Monocytes 7 Not Estab. %   Eos 3 Not Estab. %   Basos 1 Not Estab. %   Neutrophils Absolute 4.4 1.4 - 7.0 x10E3/uL   Lymphocytes Absolute 2.5 0.7 - 3.1 x10E3/uL   Monocytes Absolute 0.5 0.1 - 0.9 x10E3/uL   EOS (ABSOLUTE) 0.3 0.0 - 0.4 x10E3/uL   Basophils Absolute 0.1 0.0 - 0.2 x10E3/uL   Immature Granulocytes 0 Not Estab. %   Immature Grans (Abs) 0.0 0.0 - 0.1 x10E3/uL  Basic metabolic panel  Result Value Ref Range   Glucose 114 (H) 65 - 99 mg/dL   BUN 22 6 - 24 mg/dL   Creatinine, Ser 2.95 (H) 0.57 - 1.00 mg/dL   GFR calc non Af Amer 45 (L) >59 mL/min/1.73   GFR calc Af Amer 52 (L) >59 mL/min/1.73   BUN/Creatinine Ratio 17 9 - 23   Sodium 141 134 - 144 mmol/L    Potassium 4.7 3.5 - 5.2 mmol/L   Chloride 104  96 - 106 mmol/L   CO2 20 20 - 29 mmol/L   Calcium 9.2 8.7 - 10.2 mg/dL  UA/M w/rflx Culture, Routine  Result Value Ref Range   Specific Gravity, UA 1.020 1.005 - 1.030   pH, UA 5.5 5.0 - 7.5   Color, UA Yellow Yellow   Appearance Ur Turbid (A) Clear   Leukocytes, UA 1+ (A) Negative   Protein, UA Trace (A) Negative/Trace   Glucose, UA Negative Negative   Ketones, UA Negative Negative   RBC, UA Negative Negative   Bilirubin, UA Negative Negative   Urobilinogen, Ur 0.2 0.2 - 1.0 mg/dL   Nitrite, UA Negative Negative   Microscopic Examination See below:       Assessment & Plan:   Problem List Items Addressed This Visit      Respiratory   COPD (chronic obstructive pulmonary disease) (HCC)    Will treat with augmentin and prednisone. Recheck 2 weeks. Call with any concerns.       Relevant Medications   predniSONE (DELTASONE) 10 MG tablet     Digestive   Chronic constipation - Primary    Did well on her linzess, but having trouble affording it. Will restart as able. See GI as needed. Call with any concerns.        Other Visit Diagnoses    Hypotension, unspecified hypotension type       Over treated BP- will stop HCTZ and recheck in 2 weeks. Call with any concerns.        Follow up plan: Return in about 2 weeks (around 05/13/2018) for Lung and BP recheck.

## 2018-04-29 NOTE — Assessment & Plan Note (Signed)
Did well on her linzess, but having trouble affording it. Will restart as able. See GI as needed. Call with any concerns.

## 2018-04-29 NOTE — Telephone Encounter (Signed)
Copied from CRM [id number]. Topic: Medicare AWV >> Apr 29, 2018  2:12 PM Waldemar Dickens wrote: Called Pt and LVM regarding [employer] Referral for transportation.  c/b # [phone]  Manuela Schwartz Care Guide

## 2018-04-29 NOTE — Assessment & Plan Note (Signed)
Will treat with augmentin and prednisone. Recheck 2 weeks. Call with any concerns.

## 2018-04-29 NOTE — Patient Instructions (Signed)
STOP THE HYDROCHLORITHIAZIDE 

## 2018-05-06 ENCOUNTER — Telehealth: Payer: Self-pay | Admitting: Family Medicine

## 2018-05-06 ENCOUNTER — Ambulatory Visit (INDEPENDENT_AMBULATORY_CARE_PROVIDER_SITE_OTHER): Payer: Medicaid Other | Admitting: Gastroenterology

## 2018-05-06 ENCOUNTER — Encounter: Payer: Self-pay | Admitting: Gastroenterology

## 2018-05-06 VITALS — BP 130/80 | HR 75 | Resp 17 | Ht 65.0 in | Wt 144.4 lb

## 2018-05-06 DIAGNOSIS — I2510 Atherosclerotic heart disease of native coronary artery without angina pectoris: Secondary | ICD-10-CM | POA: Insufficient documentation

## 2018-05-06 DIAGNOSIS — K5909 Other constipation: Secondary | ICD-10-CM | POA: Diagnosis not present

## 2018-05-06 DIAGNOSIS — Z596 Low income: Secondary | ICD-10-CM | POA: Diagnosis not present

## 2018-05-06 DIAGNOSIS — I10 Essential (primary) hypertension: Secondary | ICD-10-CM | POA: Insufficient documentation

## 2018-05-06 MED ORDER — LINACLOTIDE 145 MCG PO CAPS: 145.0000 ug | ORAL_CAPSULE | Freq: Every day | ORAL | 2 refills | Status: DC

## 2018-05-06 NOTE — Telephone Encounter (Signed)
Messaged by GI- concern about her thyroid, will discuss it with her at follow up next week.

## 2018-05-06 NOTE — Progress Notes (Signed)
Natalie Repress, MD [address]  [address]  [place], [place] [address]  Main: [phone]  Fax: [phone]    Gastroenterology Consultation  Referring Provider:     Dorcas Carrow, DO Primary Care Physician:  Dorcas Carrow, DO Primary Gastroenterologist:  Dr. Lannette Donath Reason for Consultation: Lower abdominal pain, chronic constipation        HPI:   Natalie Taylor is a 58 y.o. female referred by Dr. Laural Benes, Oralia Rud, DO  for consultation & management left lower quadrant pain and chronic constipation.  Patient was admitted to [facility] in [facility]/2018 secondary to left-sided abdominal pain, underwent CT scan which revealed thickening of the sigmoid colon as well as UTI.  She was started on Cipro and Flagyl discharged home on 10-day course.  She was seen by Olevia Perches as a hospital follow-up and due to persistent left lower quadrant pain and chronic constipation, she was started on linaclotide daily.  This significantly helped with her symptoms.  Her constipation resolved.  She is out of Linzess and now with severe constipation and left lower quadrant pain She reports having bowel movement once a week associated with significant straining and hard lumpy stools.  She denies rectal bleeding She is therefore referred here for further evaluation Patient smokes cigarettes Patient has history of hypothyroidism, on Synthroid 25 MCG daily.  Her most recent TSH is elevated 7.8 in 01/2018.  Unknown T3 and T4 levels.  Her Synthroid dose has not been adjusted since then  NSAIDs: Takes ibuprofen 400 mg 2-3 times daily for chronic headaches  Antiplts/Anticoagulants/Anti thrombotics: None  GI Procedures: Colonoscopy by Dr. Servando Snare in 10/2015 - Preparation of the colon was fair. - Non-bleeding internal hemorrhoids. - No specimens collected.  Past Medical History:  Diagnosis Date  . Anxiety   . Arthritis    hands  . COPD (chronic obstructive pulmonary disease) (HCC)   . Coronary  artery disease   . Depression   . GERD (gastroesophageal reflux disease)   . Headache    daily  . Hyperlipidemia   . Hypertension   . MI (myocardial infarction) (HCC)    x 2  . Shortness of breath dyspnea    pt attributes to meds  . Sleep apnea     Past Surgical History:  Procedure Laterality Date  . CARDIAC CATHETERIZATION N/A 09/27/2015   Procedure: Left Heart Cath and Coronary Angiography;  Surgeon: Alwyn Pea, MD;  Location: [facility] INVASIVE CV LAB;  Service: Cardiovascular;  Laterality: N/A;  . CARDIAC CATHETERIZATION N/A 09/27/2015   Procedure: Coronary Stent Intervention;  Surgeon: Alwyn Pea, MD;  Location: [facility] INVASIVE CV LAB;  Service: Cardiovascular;  Laterality: N/A;  . Cardiac stents  09/27/2015  . CHOLECYSTECTOMY    . COLONOSCOPY WITH PROPOFOL N/A 11/19/2015   Procedure: COLONOSCOPY WITH PROPOFOL;  Surgeon: Midge Minium, MD;  Location: [facility];  Service: Endoscopy;  Laterality: N/A;  . INDUCED ABORTION    . MULTIPLE TOOTH EXTRACTIONS    . TUBAL LIGATION     Current Outpatient Medications:  .  albuterol (PROVENTIL HFA;VENTOLIN HFA) 108 (90 Base) MCG/ACT inhaler, Inhale 2 puffs into the lungs 2 (two) times daily., Disp: , Rfl:  .  amoxicillin-clavulanate (AUGMENTIN) 875-125 MG tablet, Take 1 tablet by mouth 2 (two) times daily., Disp: 20 tablet, Rfl: 0 .  atorvastatin (LIPITOR) 80 MG tablet, Take 1 tablet (80 mg total) by mouth daily at 6 PM., Disp: 90 tablet, Rfl: 3 .  budesonide-formoterol (SYMBICORT) 160-4.5 MCG/ACT inhaler, Inhale 2 puffs into the lungs 2 (two) times daily., Disp: 2 Inhaler, Rfl: 12 .  buPROPion (WELLBUTRIN SR) 200 MG 12 hr tablet, 1 tab daily for 1 week, then 1 tab BID (Patient taking differently: Take 200 mg by mouth 2 (two) times daily. ), Disp: 180 tablet, Rfl: 1 .  FLUoxetine (PROZAC) 20 MG tablet, Take 3 tablets (60 mg total) by mouth daily., Disp: 90 tablet, Rfl: 1 .  levothyroxine (SYNTHROID, LEVOTHROID) 25 MCG tablet, Take  1 tablet (25 mcg total) by mouth daily at 6 (six) AM., Disp: 30 tablet, Rfl: 0 .  linaclotide (LINZESS) 145 MCG CAPS capsule, Take 1 capsule (145 mcg total) by mouth daily before breakfast., Disp: 90 capsule, Rfl: 2 .  lisinopril (PRINIVIL,ZESTRIL) 10 MG tablet, Take 1 tablet (10 mg total) by mouth daily., Disp: 90 tablet, Rfl: 3 .  metoprolol tartrate (LOPRESSOR) 50 MG tablet, Take 0.5 tablets (25 mg total) by mouth 2 (two) times daily., Disp: 90 tablet, Rfl: 3 .  mirabegron ER (MYRBETRIQ) 50 MG TB24 tablet, Take 1 tablet (50 mg total) by mouth daily., Disp: 90 tablet, Rfl: 1 .  omeprazole (PRILOSEC) 20 MG capsule, Take 1 capsule (20 mg total) by mouth 2 (two) times daily before a meal., Disp: 180 capsule, Rfl: 1 .  ondansetron (ZOFRAN-ODT) 4 MG disintegrating tablet, Take 1 tablet (4 mg total) by mouth every 8 (eight) hours as needed for nausea or vomiting., Disp: 30 tablet, Rfl: 0 .  predniSONE (DELTASONE) 10 MG tablet, 6 tabs today, 5 tabs tomorrow, decrease by 1 every day until gone., Disp: 21 tablet, Rfl: 0 .  ticagrelor (BRILINTA) 90 MG TABS tablet, Take 1 tablet (90 mg total) by mouth 2 (two) times daily., Disp: 180 tablet, Rfl: 3 .  traZODone (DESYREL) 50 MG tablet, Take 1-2 tablets (50-100 mg total) by mouth at bedtime as needed for sleep., Disp: 180 tablet, Rfl: 1 .  triamcinolone ointment (KENALOG) 0.5 %, Apply 1 application topically 2 (two) times daily., Disp: 30 g, Rfl: 3 .  valACYclovir (VALTREX) 1000 MG tablet, Take 1 tablet (1,000 mg total) by mouth 2 (two) times daily., Disp: 20 tablet, Rfl: 3    Family History  Problem Relation Age of Onset  . Alcohol abuse Mother   . Arthritis Mother   . Asthma Mother   . Cancer Mother   . Hypertension Mother   . Migraines Mother   . Hyperlipidemia Sister   . Hyperlipidemia Brother   . Heart disease Maternal Grandmother   . Emphysema Maternal Grandfather   . Breast cancer Paternal Aunt 30       double mastectomy     Social History     Tobacco Use  . Smoking status: Current Every Day Smoker    Packs/day: 0.50    Years: 40.00    Pack years: 20.00    Types: Cigarettes  . Smokeless tobacco: Never Used  Substance Use Topics  . Alcohol use: No  . Drug use: No    Allergies as of 05/06/2018 - Review Complete 05/06/2018  Allergen Reaction Noted  . Cranberry Hives 11/15/2015  . Tape Rash 11/15/2015    Review of Systems:    All systems reviewed and negative except where noted in HPI.   Physical Exam:  BP 130/80 (BP Location: Left Arm, Patient Position: Sitting, Cuff Size: Normal)   Pulse 75   Resp 17   Ht 5\' 5"  (1.651 m)   Wt 144 lb 6.4  oz (65.5 kg)   BMI 24.03 kg/m  No LMP recorded. (Menstrual status: Perimenopausal).  General:   Alert,  Well-developed, well-nourished, pleasant and cooperative in NAD Head:  Normocephalic and atraumatic. Eyes:  Sclera clear, no icterus.   Conjunctiva pink. Ears:  Normal auditory acuity. Nose:  No deformity, discharge, or lesions. Mouth:  No deformity or lesions,oropharynx pink & moist. Neck:  Supple; no masses or thyromegaly. Lungs:  Respirations even and unlabored.  Clear throughout to auscultation.   No wheezes, crackles, or rhonchi. No acute distress. Heart:  Regular rate and rhythm; no murmurs, clicks, rubs, or gallops. Abdomen:  Normal bowel sounds. Soft, mild left lower quadrant tenderness and non-distended without masses, hepatosplenomegaly or hernias noted.  No guarding or rebound tenderness.   Rectal: Not performed Msk:  Symmetrical without gross deformities. Good, equal movement & strength bilaterally. Pulses:  Normal pulses noted. Extremities:  No clubbing or edema.  No cyanosis. Neurologic:  Alert and oriented x3;  grossly normal neurologically. Skin:  Intact without significant lesions or rashes. No jaundice. Psych:  Alert and cooperative. Normal mood and affect.  Imaging Studies: Reviewed  Assessment and Plan:   Natalie Taylor is a 58 y.o. Caucasian  female with chronic tobacco use, coronary disease, history of MI, COPD, hypothyroidism on Synthroid, chronic constipation, chronic left lower quadrant pain  Chronic constipation with left lower quadrant pain Poorly controlled hypothyroidism Messaged patient's PCP for dose adjustment of Synthroid since her TSH levels her elevated recently Restart linaclotide 145 MCG daily Defer antibiotics at this time  Screening colonoscopy Patient had a colonoscopy with fair prep in 2017 Recommend screening colonoscopy in 2022  Follow up in 3 months   Natalie Repressohini R Doranne Schmutz, MD

## 2018-05-06 NOTE — Patient Instructions (Signed)

## 2018-05-14 ENCOUNTER — Ambulatory Visit: Payer: Medicaid Other | Admitting: Family Medicine

## 2018-07-09 ENCOUNTER — Other Ambulatory Visit: Payer: Self-pay | Admitting: Family Medicine

## 2018-07-09 NOTE — Telephone Encounter (Signed)
Requested Prescriptions  Pending Prescriptions Disp Refills  . FLUoxetine (PROZAC) 20 MG capsule [Pharmacy Med Name: FLUOXETINE HCL 20 MG CAPSULE] 90 capsule 1    Sig: TAKE 3 CAPSULES DAILY.     Psychiatry:  Antidepressants - SSRI Passed - 07/09/2018  1:41 PM      Passed - Completed PHQ-2 or PHQ-9 in the last 360 days.      Passed - Valid encounter within last 6 months    Recent Outpatient Visits          2 months ago Chronic constipation   [facility] [place], Megan P, DO   4 months ago Acute colitis   [facility] [place], Megan P, DO   7 months ago Chronic bilateral low back pain with bilateral sciatica   [facility], Megan P, DO   9 months ago Other symptoms and signs involving the nervous system   [facility] [place], [place], DO   1 year ago Benign hypertensive renal disease   [facility] Dorcas Carrow, DO      Future Appointments            In 3 weeks Vanga, Loel Dubonnet, MD [facility]

## 2018-07-15 ENCOUNTER — Other Ambulatory Visit: Payer: Self-pay | Admitting: Family Medicine

## 2018-07-20 ENCOUNTER — Other Ambulatory Visit: Payer: Self-pay | Admitting: Family Medicine

## 2018-07-20 NOTE — Telephone Encounter (Signed)
Requested medication (s) are due for refill today: Yes  Requested medication (s) are on the active medication list: Yes  Last refill:  03/12/18  Future visit scheduled: No  Notes to clinic:  See request    Requested Prescriptions  Pending Prescriptions Disp Refills   ondansetron (ZOFRAN-ODT) 4 MG disintegrating tablet [Pharmacy Med Name: ONDANSETRON ODT 4 MG TABLET] 30 tablet 0    Sig: Take 1 tablet (4 mg total) by mouth every 8 (eight) hours as needed for nausea or vomiting.     Not Delegated - Gastroenterology: Antiemetics Failed - 07/20/2018 10:44 AM      Failed - This refill cannot be delegated      Passed - Valid encounter within last 6 months    Recent Outpatient Visits          2 months ago Chronic constipation   [facility] [place], Megan P, DO   4 months ago Acute colitis   [facility] [place], Megan P, DO   7 months ago Chronic bilateral low back pain with bilateral sciatica   [facility], Megan P, DO   9 months ago Other symptoms and signs involving the nervous system   [facility] [place], [place], DO   1 year ago Benign hypertensive renal disease   [facility] Dorcas Carrow, DO      Future Appointments            In 2 weeks Vanga, Loel Dubonnet, MD [facility]

## 2018-08-05 ENCOUNTER — Ambulatory Visit: Payer: Medicaid Other | Admitting: Gastroenterology

## 2018-08-24 ENCOUNTER — Other Ambulatory Visit: Payer: Self-pay | Admitting: Family Medicine

## 2018-08-24 NOTE — Telephone Encounter (Signed)
Requested medication (s) are due for refill today: yes  Requested medication (s) are on the active medication list: yes  Last refill:  03/31/2018  Future visit scheduled: no  Notes to clinic: not delegated    Requested Prescriptions  Pending Prescriptions Disp Refills   BRILINTA 90 MG TABS tablet [Pharmacy Med Name: BRILINTA 90 MG TABLET] 60 tablet 0    Sig: Take 1 tablet (90 mg total) by mouth 2 (two) times daily.     Not Delegated - Hematology: Antiplatelets - ticagrelor Failed - 08/24/2018 11:00 AM      Failed - This refill cannot be delegated      Failed - Cr in normal range and within 180 days    Creatinine, Ser  Date Value Ref Range Status  03/04/2018 1.32 (H) 0.57 - 1.00 mg/dL Final         Passed - HCT in normal range and within 180 days    Hematocrit  Date Value Ref Range Status  03/04/2018 40.2 34.0 - 46.6 % Final         Passed - HGB in normal range and within 180 days    Hemoglobin  Date Value Ref Range Status  03/04/2018 13.4 11.1 - 15.9 g/dL Final         Passed - PLT in normal range and within 180 days    Platelets  Date Value Ref Range Status  03/04/2018 280 150 - 450 x10E3/uL Final         Passed - Valid encounter within last 6 months    Recent Outpatient Visits          3 months ago Chronic constipation   [facility] [place], Megan P, DO   5 months ago Acute colitis   [facility] [place], Megan P, DO   8 months ago Chronic bilateral low back pain with bilateral sciatica   [facility] [place], Megan P, DO   11 months ago Other symptoms and signs involving the nervous system   [facility] [place], Megan P, DO   1 year ago Benign hypertensive renal disease   [facility] [place], [place], DO

## 2018-08-30 ENCOUNTER — Other Ambulatory Visit: Payer: Self-pay | Admitting: Family Medicine

## 2018-08-30 NOTE — Telephone Encounter (Signed)
Needs follow up appointment.  

## 2018-08-30 NOTE — Telephone Encounter (Signed)
Requested medication (s) are due for refill today -yes  Requested medication (s) are on the active medication list -yes  Future visit scheduled -no  Last refill: 09/28/17  Notes to clinic: Patient is requesting refill of medication she uses for sleep aid- meets criteria for refill- because of specific use- sent for review and refill per PCP  Requested Prescriptions  Pending Prescriptions Disp Refills   traZODone (DESYREL) 50 MG tablet [Pharmacy Med Name: TRAZODONE 50 MG TABLET] 180 tablet 0    Sig: Take 1-2 tablets (50-100 mg total) by mouth at bedtime as needed for sleep.     Psychiatry: Antidepressants - Serotonin Modulator Passed - 08/30/2018  3:29 PM      Passed - Completed PHQ-2 or PHQ-9 in the last 360 days.      Passed - Valid encounter within last 6 months    Recent Outpatient Visits          4 months ago Chronic constipation   [facility] [place], Megan P, DO   5 months ago Acute colitis   [facility] [place], Megan P, DO   9 months ago Chronic bilateral low back pain with bilateral sciatica   [facility], Megan P, DO   11 months ago Other symptoms and signs involving the nervous system   [facility] [place], [place], DO   1 year ago Benign hypertensive renal disease   [facility] [place], Megan P, DO              Requested Prescriptions  Pending Prescriptions Disp Refills   traZODone (DESYREL) 50 MG tablet [Pharmacy Med Name: TRAZODONE 50 MG TABLET] 180 tablet 0    Sig: Take 1-2 tablets (50-100 mg total) by mouth at bedtime as needed for sleep.     Psychiatry: Antidepressants - Serotonin Modulator Passed - 08/30/2018  3:29 PM      Passed - Completed PHQ-2 or PHQ-9 in the last 360 days.      Passed - Valid encounter within last 6 months    Recent Outpatient Visits          4 months ago Chronic constipation   [facility] [place], Megan P, DO   5 months ago Acute colitis   [facility] [place], Megan P, DO   9 months ago Chronic bilateral low back pain with bilateral sciatica   [facility], Megan P, DO   11 months ago Other symptoms and signs involving the nervous system   [facility] [place], Nikolai, DO   1 year ago Benign hypertensive renal disease   [facility] [place], [place], DO

## 2018-08-31 ENCOUNTER — Encounter: Payer: Self-pay | Admitting: Family Medicine

## 2018-08-31 ENCOUNTER — Other Ambulatory Visit: Payer: Self-pay | Admitting: Family Medicine

## 2018-08-31 NOTE — Telephone Encounter (Signed)
Called pt, home number was incorrect and the mobile number had another name on voicemail. Will mail letter for follow up.

## 2018-10-11 ENCOUNTER — Other Ambulatory Visit: Payer: Self-pay | Admitting: Family Medicine

## 2018-10-15 ENCOUNTER — Other Ambulatory Visit: Payer: Self-pay | Admitting: Family Medicine

## 2018-10-15 NOTE — Telephone Encounter (Signed)
Requested medication (s) are due for refill today: yes  Requested medication (s) are on the active medication list: yes  Last refill: 5/15/020  Future visit scheduled: no  Notes to clinic: not delegated    Requested Prescriptions  Pending Prescriptions Disp Refills   ondansetron (ZOFRAN-ODT) 4 MG disintegrating tablet [Pharmacy Med Name: ONDANSETRON ODT 4 MG TABLET] 30 tablet 0    Sig: Take 1 tablet (4 mg total) by mouth every 8 (eight) hours as needed for nausea or vomiting.     Not Delegated - Gastroenterology: Antiemetics Failed - 10/15/2018  1:37 PM      Failed - This refill cannot be delegated      Passed - Valid encounter within last 6 months    Recent Outpatient Visits          5 months ago Chronic constipation   [place], Megan P, DO   7 months ago Acute colitis   [facility] [place], Megan P, DO   10 months ago Chronic bilateral low back pain with bilateral sciatica   [place], Megan P, DO   1 year ago Other symptoms and signs involving the nervous system   [place], Megan P, DO   1 year ago Benign hypertensive renal disease   [facility] [place], [place], DO

## 2018-11-29 ENCOUNTER — Other Ambulatory Visit: Payer: Self-pay | Admitting: Family Medicine

## 2018-11-29 NOTE — Telephone Encounter (Signed)
Requested Prescriptions  Pending Prescriptions Disp Refills  . atorvastatin (LIPITOR) 80 MG tablet [Pharmacy Med Name: ATORVASTATIN 80 MG TABLET] 90 tablet 0    Sig: Take 1 tablet (80 mg total) by mouth daily at 6 PM.     Cardiovascular:  Antilipid - Statins Failed - 11/29/2018  2:36 PM      Failed - Total Cholesterol in normal range and within 360 days    Cholesterol, Total  Date Value Ref Range Status  09/28/2017 141 100 - 199 mg/dL Final         Failed - LDL in normal range and within 360 days    LDL Calculated  Date Value Ref Range Status  09/28/2017 83 0 - 99 mg/dL Final         Failed - HDL in normal range and within 360 days    HDL  Date Value Ref Range Status  09/28/2017 40 >39 mg/dL Final         Failed - Triglycerides in normal range and within 360 days    Triglycerides  Date Value Ref Range Status  09/28/2017 88 0 - 149 mg/dL Final         Passed - Patient is not pregnant      Passed - Valid encounter within last 12 months    Recent Outpatient Visits          7 months ago Chronic constipation   [place], Megan P, DO   9 months ago Acute colitis   [facility] [place], Megan P, DO   12 months ago Chronic bilateral low back pain with bilateral sciatica   [place], Megan P, DO   1 year ago Other symptoms and signs involving the nervous system   [employer], Megan P, DO   1 year ago Benign hypertensive renal disease   [facility] [place], Barb Merino, DO      Future Appointments            In 2 weeks Wynetta Emery, Barb Merino, DO [employer], [facility]           . lisinopril (ZESTRIL) 10 MG tablet [Pharmacy Med Name: LISINOPRIL 10 MG TABLET] 90 tablet 0    Sig: Take 1 tablet (10 mg total) by mouth daily.     Cardiovascular:  ACE Inhibitors Failed - 11/29/2018  2:36 PM      Failed - Cr in normal range and within 180 days    Creatinine, Ser  Date Value Ref Range  Status  03/04/2018 1.32 (H) 0.57 - 1.00 mg/dL Final         Failed - K in normal range and within 180 days    Potassium  Date Value Ref Range Status  03/04/2018 4.7 3.5 - 5.2 mmol/L Final         Failed - Valid encounter within last 6 months    Recent Outpatient Visits          7 months ago Chronic constipation   [place], Megan P, DO   9 months ago Acute colitis   [place], Megan P, DO   12 months ago Chronic bilateral low back pain with bilateral sciatica   [place], Megan P, DO   1 year ago Other symptoms and signs involving the nervous system   [facility] [place], Megan P, DO   1 year ago Benign hypertensive renal disease  [facility] [place], [place], DO      Future Appointments            In 2 weeks Laural BenesJohnson, Megan P, DO [facility], [facility]           Passed - Patient is not pregnant      Passed - Last BP in normal range    BP Readings from Last 1 Encounters:  05/06/18 130/80         . metoprolol tartrate (LOPRESSOR) 50 MG tablet [Pharmacy Med Name: METOPROLOL TARTRATE 50 MG TAB] 90 tablet 0    Sig: Take 0.5 tablets (25 mg total) by mouth 2 (two) times daily.     Cardiovascular:  Beta Blockers Failed - 11/29/2018  2:36 PM      Failed - Valid encounter within last 6 months    Recent Outpatient Visits          7 months ago [facility] const[facility]   [facility] [place] [place] [facility]   9 months ago Acute colitis   [facility] [place] [place] [place]   12 months ago Chronic bilateral low back pain with bilateral sciatica   [facility] [place] [place], [place]   1 year ago Other symptoms and signs involving the nervous system   [facility] [place], [place], DO   1 year ago Benign hypertensive renal disease   [facility] [place], [place]      Future Appointments            In 2 weeks [place],  Megan P, DO [facility], [facility]           Passed - Last BP in normal range    BP Readings from Last 1 Encounters:  05/06/18 130/80         Passed - Last Heart Rate in normal range    Pulse Readings from Last 1 Encounters:  05/06/18 75

## 2018-12-13 ENCOUNTER — Other Ambulatory Visit: Payer: Self-pay

## 2018-12-13 ENCOUNTER — Encounter: Payer: Self-pay | Admitting: Family Medicine

## 2018-12-13 ENCOUNTER — Ambulatory Visit: Payer: Medicaid Other | Admitting: Family Medicine

## 2018-12-13 VITALS — BP 133/74 | HR 64 | Temp 98.7°F | Ht 65.0 in | Wt 147.0 lb

## 2018-12-13 DIAGNOSIS — N3000 Acute cystitis without hematuria: Secondary | ICD-10-CM | POA: Diagnosis not present

## 2018-12-13 DIAGNOSIS — I129 Hypertensive chronic kidney disease with stage 1 through stage 4 chronic kidney disease, or unspecified chronic kidney disease: Secondary | ICD-10-CM

## 2018-12-13 DIAGNOSIS — Z748 Other problems related to care provider dependency: Secondary | ICD-10-CM | POA: Diagnosis not present

## 2018-12-13 DIAGNOSIS — E782 Mixed hyperlipidemia: Secondary | ICD-10-CM | POA: Diagnosis not present

## 2018-12-13 DIAGNOSIS — F332 Major depressive disorder, recurrent severe without psychotic features: Secondary | ICD-10-CM | POA: Diagnosis not present

## 2018-12-13 DIAGNOSIS — Z1239 Encounter for other screening for malignant neoplasm of breast: Secondary | ICD-10-CM

## 2018-12-13 DIAGNOSIS — H538 Other visual disturbances: Secondary | ICD-10-CM

## 2018-12-13 DIAGNOSIS — I2510 Atherosclerotic heart disease of native coronary artery without angina pectoris: Secondary | ICD-10-CM | POA: Diagnosis not present

## 2018-12-13 DIAGNOSIS — M25552 Pain in left hip: Secondary | ICD-10-CM

## 2018-12-13 DIAGNOSIS — M25551 Pain in right hip: Secondary | ICD-10-CM | POA: Diagnosis not present

## 2018-12-13 DIAGNOSIS — Z596 Low income: Secondary | ICD-10-CM | POA: Diagnosis not present

## 2018-12-13 DIAGNOSIS — J441 Chronic obstructive pulmonary disease with (acute) exacerbation: Secondary | ICD-10-CM | POA: Diagnosis not present

## 2018-12-13 MED ORDER — VALACYCLOVIR HCL 1 G PO TABS: 1000.0000 mg | ORAL_TABLET | Freq: Two times a day (BID) | ORAL | 3 refills | Status: DC

## 2018-12-13 MED ORDER — BUDESONIDE-FORMOTEROL FUMARATE 160-4.5 MCG/ACT IN AERO: 2.0000 | INHALATION_SPRAY | Freq: Two times a day (BID) | RESPIRATORY_TRACT | 12 refills | Status: DC

## 2018-12-13 MED ORDER — METOPROLOL TARTRATE 50 MG PO TABS: 25.0000 mg | ORAL_TABLET | Freq: Two times a day (BID) | ORAL | 1 refills | Status: DC

## 2018-12-13 MED ORDER — OMEPRAZOLE 20 MG PO CPDR: 20.0000 mg | DELAYED_RELEASE_CAPSULE | Freq: Two times a day (BID) | ORAL | 1 refills | Status: DC

## 2018-12-13 MED ORDER — TRAZODONE HCL 50 MG PO TABS: 50.0000 mg | ORAL_TABLET | Freq: Every evening | ORAL | 3 refills | Status: DC | PRN

## 2018-12-13 MED ORDER — ATORVASTATIN CALCIUM 80 MG PO TABS: 80.0000 mg | ORAL_TABLET | Freq: Every day | ORAL | 1 refills | Status: DC

## 2018-12-13 MED ORDER — LISINOPRIL 10 MG PO TABS: 10.0000 mg | ORAL_TABLET | Freq: Every day | ORAL | 1 refills | Status: DC

## 2018-12-13 MED ORDER — BUPROPION HCL ER (SR) 200 MG PO TB12: ORAL_TABLET | ORAL | 1 refills | Status: DC

## 2018-12-13 NOTE — Progress Notes (Signed)
BP 133/74   Pulse 64   Temp 98.7 F (37.1 C) (Oral)   Ht 5\' 5"  (1.651 m)   Wt 147 lb (66.7 kg)   SpO2 93%   BMI 24.46 kg/m    Subjective:    Patient ID: Natalie Taylor, female    DOB: 10/01/1960, 58 y.o.   MRN: [id number]  HPI: Natalie Taylor is a 58 y.o. female  Chief Complaint  Patient presents with  . Hypertension  . Depression    wellbutrin, prozac refill  . Anxiety  . Hyperlipidemia  . Back Pain    cyclobenzaprine refill   HYPERTENSION / HYPERLIPIDEMIA Satisfied with current treatment? yes Duration of hypertension: chronic BP monitoring frequency: not checking BP medication side effects: no Past BP meds: lisinopril, metoprolol Duration of hyperlipidemia: chronic Cholesterol medication side effects: no Cholesterol supplements: none Past cholesterol medications: atorvastatin Medication compliance: unclear compliance Aspirin: no Recent stressors: yes Recurrent headaches: no Visual changes: yes- blurred vision Palpitations: yes Dyspnea: yes Chest pain: yes Lower extremity edema: yes Dizzy/lightheaded: yes  HYPOTHYROIDISM Thyroid control status:unsure Satisfied with current treatment? yes Medication side effects: no Medication compliance: unclear Recent dose adjustment:no Fatigue: yes Cold intolerance: yes Heat intolerance: yes Weight gain: yes Weight loss: no Constipation: no Diarrhea/loose stools: yes Palpitations: yes Lower extremity edema: yes Anxiety/depressed mood: yes  ANXIETY/DEPRESSION Duration:uncontrolled Anxious mood: yes  Excessive worrying: yes Irritability: yes  Sweating: yes Nausea: yes Palpitations:yes Hyperventilation: yes Panic attacks: yes Agoraphobia: yes  Obscessions/compulsions: yes Depressed mood: yes [facility] 12/13/2018 03/04/2018 09/28/2017 04/24/2017 10/28/2016  Decreased Interest 3 3 3 2 2   Down, Depressed, Hopeless 3 3 2 2 3   PHQ - 2 Score 6 6 5 4 5   Altered sleeping 2 3 3 3 3   Tired,  decreased energy 3 3 1 3 2   Change in appetite 2 3 1 2 2   Feeling bad or failure about yourself  3 - 1 2 2   Trouble concentrating 3 2 2 2 2   Moving slowly or fidgety/restless 3 2 3 3 2   Suicidal thoughts 3 2 1 1 1   PHQ-9 Score 25 21 17 20 19   Difficult doing work/chores Extremely dIfficult Very difficult - Very difficult -  Some recent data might be hidden   Anhedonia: yes Weight changes: no Insomnia: yes hard to fall asleep  Hypersomnia: yes Fatigue/loss of energy: yes Feelings of worthlessness: yes Feelings of guilt: yes Impaired concentration/indecisiveness: yes Suicidal ideations: yes  Crying spells: yes Recent Stressors/Life Changes: yes   Relationship problems: yes   Family stress: yes     Financial stress: yes    Job stress: yes    Recent death/loss: no   Relevant past medical, surgical, family and social history reviewed and updated as indicated. Interim medical history since our last visit reviewed. Allergies and medications reviewed and updated.  Review of Systems  Constitutional: Positive for fatigue. Negative for activity change, appetite change, chills, diaphoresis, fever and unexpected weight change.  HENT: Negative.   Respiratory: Positive for chest tightness and shortness of breath. Negative for apnea, cough, choking, wheezing and stridor.   Cardiovascular: Positive for chest pain, palpitations and leg swelling.  Gastrointestinal: Negative.   Musculoskeletal: Negative.   Skin: Negative.   Psychiatric/Behavioral: Positive for decreased concentration and dysphoric mood. Negative for agitation, behavioral problems, confusion, hallucinations, self-injury, sleep disturbance and suicidal ideas. The patient is nervous/anxious. The patient is not hyperactive.     Per HPI unless specifically indicated above  Objective:    BP 133/74   Pulse 64   Temp 98.7 F (37.1 C) (Oral)   Ht 5\' 5"  (1.651 m)   Wt 147 lb (66.7 kg)   SpO2 93%   BMI 24.46 kg/m   Wt  Readings from Last 3 Encounters:  12/13/18 147 lb (66.7 kg)  05/06/18 144 lb 6.4 oz (65.5 kg)  04/29/18 135 lb 8 oz (61.5 kg)    Physical Exam Vitals signs and nursing note reviewed.  Constitutional:      General: She is not in acute distress.    Appearance: Normal appearance. She is not ill-appearing, toxic-appearing or diaphoretic.  HENT:     Head: Normocephalic and atraumatic.     Right Ear: External ear normal.     Left Ear: External ear normal.     Nose: Nose normal.     Mouth/Throat:     Mouth: Mucous membranes are moist.     Pharynx: Oropharynx is clear.  Eyes:     General: No scleral icterus.       Right eye: No discharge.        Left eye: No discharge.     Extraocular Movements: Extraocular movements intact.     Conjunctiva/sclera: Conjunctivae normal.     Pupils: Pupils are equal, round, and reactive to light.  Neck:     Musculoskeletal: Normal range of motion and neck supple.  Cardiovascular:     Rate and Rhythm: Normal rate and regular rhythm.     Pulses: Normal pulses.     Heart sounds: Normal heart sounds. No murmur. No friction rub. No gallop.   Pulmonary:     Effort: Pulmonary effort is normal. No respiratory distress.     Breath sounds: Normal breath sounds. No stridor. No wheezing, rhonchi or rales.  Chest:     Chest wall: No tenderness.  Musculoskeletal: Normal range of motion.  Skin:    General: Skin is warm and dry.     Capillary Refill: Capillary refill takes less than 2 seconds.     Coloration: Skin is not jaundiced or pale.     Findings: No bruising, erythema, lesion or rash.  Neurological:     General: No focal deficit present.     Mental Status: She is alert and oriented to person, place, and time. Mental status is at baseline.  Psychiatric:        Mood and Affect: Mood normal.        Behavior: Behavior normal.        Thought Content: Thought content normal.        Judgment: Judgment normal.     Results for orders placed or performed in  visit on 12/13/18  Microscopic Examination   BLD  Result Value Ref Range   WBC, UA 11-30 (A) 0 - 5 /hpf   RBC None seen 0 - 2 /hpf   Epithelial Cells (non renal) 0-10 0 - 10 /hpf   Bacteria, UA Many (A) None seen/Few  Urine Culture, Reflex   BLD  Result Value Ref Range   Urine Culture, Routine WILL FOLLOW   Comprehensive metabolic panel  Result Value Ref Range   Glucose 76 65 - 99 mg/dL   BUN 25 (H) 6 - 24 mg/dL   Creatinine, Ser 1.611.40 (H) 0.57 - 1.00 mg/dL   GFR calc non Af Amer 42 (L) >59 mL/min/1.73   GFR calc Af Amer 48 (L) >59 mL/min/1.73   BUN/Creatinine Ratio 18 9 - 23  Sodium 142 134 - 144 mmol/L   Potassium 4.4 3.5 - 5.2 mmol/L   Chloride 103 96 - 106 mmol/L   CO2 24 20 - 29 mmol/L   Calcium 9.2 8.7 - 10.2 mg/dL   Total Protein 7.4 6.0 - 8.5 g/dL   Albumin 4.1 3.8 - 4.9 g/dL   Globulin, Total 3.3 1.5 - 4.5 g/dL   Albumin/Globulin Ratio 1.2 1.2 - 2.2   Bilirubin Total 0.5 0.0 - 1.2 mg/dL   Alkaline Phosphatase 130 (H) 39 - 117 IU/L   AST 12 0 - 40 IU/L   ALT 10 0 - 32 IU/L  Lipid Panel w/o Chol/HDL Ratio  Result Value Ref Range   Cholesterol, Total 132 100 - 199 mg/dL   Triglycerides 69 0 - 149 mg/dL   HDL 40 >16>39 mg/dL   VLDL Cholesterol Cal 14 5 - 40 mg/dL   LDL Calculated 78 0 - 99 mg/dL  Microalbumin, Urine Waived  Result Value Ref Range   Microalb, Ur Waived 150 (H) 0 - 19 mg/L   Creatinine, Urine Waived 200 10 - 300 mg/dL   Microalb/Creat Ratio 30-300 (H) <30 mg/g  TSH  Result Value Ref Range   TSH 1.120 0.450 - 4.500 uIU/mL  UA/M w/rflx Culture, Routine   Specimen: Blood   BLD  Result Value Ref Range   Specific Gravity, UA 1.015 1.005 - 1.030   pH, UA 7.0 5.0 - 7.5   Color, UA Yellow Yellow   Appearance Ur Cloudy (A) Clear   Leukocytes,UA 1+ (A) Negative   Protein,UA 1+ (A) Negative/Trace   Glucose, UA Negative Negative   Ketones, UA Negative Negative   RBC, UA Negative Negative   Bilirubin, UA Negative Negative   Urobilinogen, Ur 0.2 0.2 -  1.0 mg/dL   Nitrite, UA Positive (A) Negative   Microscopic Examination See below:    Urinalysis Reflex Comment   CBC with Differential/Platelet  Result Value Ref Range   WBC 7.2 3.4 - 10.8 x10E3/uL   RBC 4.55 3.77 - 5.28 x10E6/uL   Hemoglobin 13.6 11.1 - 15.9 g/dL   Hematocrit 10.940.1 60.434.0 - 46.6 %   MCV 88 79 - 97 fL   MCH 29.9 26.6 - 33.0 pg   MCHC 33.9 31.5 - 35.7 g/dL   RDW 54.013.9 98.111.7 - 19.115.4 %   Platelets 239 150 - 450 x10E3/uL   Neutrophils 55 Not Estab. %   Lymphs 29 Not Estab. %   Monocytes 9 Not Estab. %   Eos 6 Not Estab. %   Basos 1 Not Estab. %   Neutrophils Absolute 4.0 1.4 - 7.0 x10E3/uL   Lymphocytes Absolute 2.1 0.7 - 3.1 x10E3/uL   Monocytes Absolute 0.6 0.1 - 0.9 x10E3/uL   EOS (ABSOLUTE) 0.4 0.0 - 0.4 x10E3/uL   Basophils Absolute 0.1 0.0 - 0.2 x10E3/uL   Immature Granulocytes 0 Not Estab. %   Immature Grans (Abs) 0.0 0.0 - 0.1 x10E3/uL      Assessment & Plan:   Problem List Items Addressed This Visit      Cardiovascular and Mediastinum   Coronary artery disease    Unclear what medication she is taking. Will get her into see the pharmacist to see if we can find out exactly what she's taking. Will get her back in to see her cardiologist- appointment scheduled for next week. Checking labs today. Await results. Call with any concerns.       Relevant Medications   atorvastatin (LIPITOR) 80 MG tablet  lisinopril (ZESTRIL) 10 MG tablet   metoprolol tartrate (LOPRESSOR) 50 MG tablet   Other Relevant Orders   CBC with Differential/Platelet   Comprehensive metabolic panel (Completed)   TSH (Completed)   UA/M w/rflx Culture, Routine (Completed)   Referral to Chronic Care Management Services     Respiratory   COPD (chronic obstructive pulmonary disease) ([place])    Under good control on current regimen. Continue current regimen. Continue to monitor. Call with any concerns. Refills given.        Relevant Medications   budesonide-formoterol (SYMBICORT) 160-4.5  MCG/ACT inhaler   Other Relevant Orders   CBC with Differential/Platelet   Comprehensive metabolic panel (Completed)   TSH (Completed)   UA/M w/rflx Culture, Routine (Completed)     Genitourinary   Benign hypertensive renal disease - Primary    Under good control on current regimen. Continue current regimen. Continue to monitor. Call with any concerns. Unclear what medicine she is taking- will get her into see [facility] pharmacy and see what she is taking. Refills given. Labs drawn today       Relevant Orders   CBC with Differential/Platelet   Comprehensive metabolic panel (Completed)   Microalbumin, Urine Waived (Completed)   TSH (Completed)   UA/M w/rflx Culture, Routine (Completed)   Referral to Chronic Care Management Services     Other   HLD (hyperlipidemia)    Rechecking levels today. Await results. Treat as needed. Refills given.       Relevant Medications   atorvastatin (LIPITOR) 80 MG tablet   lisinopril (ZESTRIL) 10 MG tablet   metoprolol tartrate (LOPRESSOR) 50 MG tablet   Other Relevant Orders   CBC with Differential/Platelet   Comprehensive metabolic panel (Completed)   Lipid Panel w/o Chol/HDL Ratio (Completed)   TSH (Completed)   UA/M w/rflx Culture, Routine (Completed)   Depression    Not under good control. Unclear what she's been taking. We will get her into see the [facility] pharmacist to see what she has been taking and adjust medicine as needed.       Relevant Medications   buPROPion (WELLBUTRIN SR) 200 MG 12 hr tablet   traZODone (DESYREL) 50 MG tablet   Other Relevant Orders   CBC with Differential/Platelet   Comprehensive metabolic panel (Completed)   TSH (Completed)   UA/M w/rflx Culture, Routine (Completed)   Referral to Chronic Care Management Services    Other Visit Diagnoses    Breast cancer screening       Mammogram ordered today.   Relevant Orders   MM DIGITAL SCREENING BILATERAL   Blurred vision       Referral to opthalmology made today.    Relevant Orders   Ambulatory referral to Ophthalmology   Bilateral hip pain       Encouraged patient to get x-rays. Await results.    Relevant Orders   DG Hip Unilat W OR W/O Pelvis 2-3 Views Right   DG Hip Unilat W OR W/O Pelvis 2-3 Views Left   Assistance needed with transportation       Referral to [facility]3 made today.   Relevant Orders   Ambulatory referral to Connected Care   Acute cystitis without hematuria       Will treat with cipro. Await results.        Follow up plan: Return in about 1 week (around 12/20/2018).

## 2018-12-13 NOTE — Patient Instructions (Addendum)
Dr. [facility]- Cardiology Aug 31 11AM [address] [place], [place], [place] [address] [phone]   Please bring ALL of your medicines to your appointment with Catie

## 2018-12-14 ENCOUNTER — Encounter: Payer: Self-pay | Admitting: Family Medicine

## 2018-12-14 ENCOUNTER — Telehealth: Payer: Self-pay | Admitting: Family Medicine

## 2018-12-14 LAB — COMPREHENSIVE METABOLIC PANEL
ALT: 10 IU/L (ref 0–32)
AST: 12 IU/L (ref 0–40)
Albumin/Globulin Ratio: 1.2 (ref 1.2–2.2)
Albumin: 4.1 g/dL (ref 3.8–4.9)
Alkaline Phosphatase: 130 IU/L — ABNORMAL HIGH (ref 39–117)
BUN/Creatinine Ratio: 18 (ref 9–23)
BUN: 25 mg/dL — ABNORMAL HIGH (ref 6–24)
Bilirubin Total: 0.5 mg/dL (ref 0.0–1.2)
CO2: 24 mmol/L (ref 20–29)
Calcium: 9.2 mg/dL (ref 8.7–10.2)
Chloride: 103 mmol/L (ref 96–106)
Creatinine, Ser: 1.4 mg/dL — ABNORMAL HIGH (ref 0.57–1.00)
GFR calc Af Amer: 48 mL/min/{1.73_m2} — ABNORMAL LOW (ref >59)
GFR calc non Af Amer: 42 mL/min/{1.73_m2} — ABNORMAL LOW (ref >59)
Globulin, Total: 3.3 g/dL (ref 1.5–4.5)
Glucose: 76 mg/dL (ref 65–99)
Potassium: 4.4 mmol/L (ref 3.5–5.2)
Sodium: 142 mmol/L (ref 134–144)
Total Protein: 7.4 g/dL (ref 6.0–8.5)

## 2018-12-14 LAB — LIPID PANEL W/O CHOL/HDL RATIO
Cholesterol, Total: 132 mg/dL (ref 100–199)
HDL: 40 mg/dL (ref >39)
LDL Calculated: 78 mg/dL (ref 0–99)
Triglycerides: 69 mg/dL (ref 0–149)
VLDL Cholesterol Cal: 14 mg/dL (ref 5–40)

## 2018-12-14 LAB — CBC WITH DIFFERENTIAL/PLATELET
Basophils Absolute: 0.1 10*3/uL (ref 0.0–0.2)
Basos: 1 %
EOS (ABSOLUTE): 0.4 10*3/uL (ref 0.0–0.4)
Eos: 6 %
Hematocrit: 40.1 % (ref 34.0–46.6)
Hemoglobin: 13.6 g/dL (ref 11.1–15.9)
Immature Grans (Abs): 0 10*3/uL (ref 0.0–0.1)
Immature Granulocytes: 0 %
Lymphocytes Absolute: 2.1 10*3/uL (ref 0.7–3.1)
Lymphs: 29 %
MCH: 29.9 pg (ref 26.6–33.0)
MCHC: 33.9 g/dL (ref 31.5–35.7)
MCV: 88 fL (ref 79–97)
Monocytes Absolute: 0.6 10*3/uL (ref 0.1–0.9)
Monocytes: 9 %
Neutrophils Absolute: 4 10*3/uL (ref 1.4–7.0)
Neutrophils: 55 %
Platelets: 239 10*3/uL (ref 150–450)
RBC: 4.55 x10E6/uL (ref 3.77–5.28)
RDW: 13.9 % (ref 11.7–15.4)
WBC: 7.2 10*3/uL (ref 3.4–10.8)

## 2018-12-14 LAB — TSH: TSH: 1.12 u[IU]/mL (ref 0.450–4.500)

## 2018-12-14 MED ORDER — CIPROFLOXACIN HCL 500 MG PO TABS: 500.0000 mg | ORAL_TABLET | Freq: Two times a day (BID) | ORAL | 0 refills | Status: DC

## 2018-12-14 NOTE — Assessment & Plan Note (Signed)
Under good control on current regimen. Continue current regimen. Continue to monitor. Call with any concerns. Refills given.   

## 2018-12-14 NOTE — Assessment & Plan Note (Signed)
Under good control on current regimen. Continue current regimen. Continue to monitor. Call with any concerns. Unclear what medicine she is taking- will get her into see [facility] pharmacy and see what she is taking. Refills given. Labs drawn today

## 2018-12-14 NOTE — Assessment & Plan Note (Signed)
Rechecking levels today. Await results. Treat as needed. Refills given.

## 2018-12-14 NOTE — Telephone Encounter (Signed)
Patient notified

## 2018-12-14 NOTE — Assessment & Plan Note (Signed)
Under good control on current regimen. Continue current regimen. Continue to monitor. Call with any concerns. Unclear what medicine she is taking- will get her into see [facility] pharmacy and see what she is taking. Refills given. Labs drawn today  

## 2018-12-14 NOTE — Assessment & Plan Note (Signed)
Not under good control. Unclear what she's been taking. We will get her into see the [facility] pharmacist to see what she has been taking and adjust medicine as needed.

## 2018-12-14 NOTE — Telephone Encounter (Signed)
Please let her know that her labs came back nice and normal except she has a UTI- I've sent her an antibiotic over to [place] for her. Thanks!

## 2018-12-14 NOTE — Assessment & Plan Note (Signed)
Unclear what medication she is taking. Will get her into see the pharmacist to see if we can find out exactly what she's taking. Will get her back in to see her cardiologist- appointment scheduled for next week. Checking labs today. Await results. Call with any concerns.

## 2018-12-15 LAB — UA/M W/RFLX CULTURE, ROUTINE
Bilirubin, UA: NEGATIVE
Glucose, UA: NEGATIVE
Ketones, UA: NEGATIVE
Nitrite, UA: POSITIVE — AB
RBC, UA: NEGATIVE
Specific Gravity, UA: 1.015 (ref 1.005–1.030)
Urobilinogen, Ur: 0.2 mg/dL (ref 0.2–1.0)
pH, UA: 7 (ref 5.0–7.5)

## 2018-12-15 LAB — URINE CULTURE, REFLEX

## 2018-12-15 LAB — MICROALBUMIN, URINE WAIVED
Creatinine, Urine Waived: 200 mg/dL (ref 10–300)
Microalb, Ur Waived: 150 mg/L — ABNORMAL HIGH (ref 0–19)

## 2018-12-15 LAB — MICROSCOPIC EXAMINATION: RBC, Urine: NONE SEEN /hpf (ref 0–2)

## 2018-12-21 ENCOUNTER — Ambulatory Visit: Payer: Self-pay

## 2018-12-22 ENCOUNTER — Ambulatory Visit: Payer: Self-pay | Admitting: Pharmacist

## 2018-12-22 ENCOUNTER — Telehealth: Payer: Self-pay

## 2018-12-22 DIAGNOSIS — I10 Essential (primary) hypertension: Secondary | ICD-10-CM | POA: Diagnosis not present

## 2018-12-22 DIAGNOSIS — I208 Other forms of angina pectoris: Secondary | ICD-10-CM | POA: Diagnosis not present

## 2018-12-22 DIAGNOSIS — I2510 Atherosclerotic heart disease of native coronary artery without angina pectoris: Secondary | ICD-10-CM

## 2018-12-22 DIAGNOSIS — E782 Mixed hyperlipidemia: Secondary | ICD-10-CM

## 2018-12-22 DIAGNOSIS — Z596 Low income: Secondary | ICD-10-CM | POA: Diagnosis not present

## 2018-12-22 DIAGNOSIS — E785 Hyperlipidemia, unspecified: Secondary | ICD-10-CM | POA: Diagnosis not present

## 2018-12-22 DIAGNOSIS — I129 Hypertensive chronic kidney disease with stage 1 through stage 4 chronic kidney disease, or unspecified chronic kidney disease: Secondary | ICD-10-CM

## 2018-12-22 DIAGNOSIS — R0602 Shortness of breath: Secondary | ICD-10-CM | POA: Diagnosis not present

## 2018-12-22 NOTE — Patient Instructions (Signed)
Visit Information  Goals Addressed            This Visit's Progress     Patient Stated   . "I have a lot of medications" (pt-stated)       Current Barriers:  . Polypharmacy; complex patient with multiple comorbidities including tobacco abuse, CAD hx NSTEMI, COPD, depression . Self-manages medications by using a pill box. Was unsure which medications she was taking at last PCP visit. [facility]; obtained refill hx to collaborate patient's review of her medications o CAD/hx NSTEMI: taking atorvastatin 80 mg daily (LF 11/29/2018 for 90 day); metoprolol tartrate 12.5 mg BID (LF 11/29/2018 for 90 day); lisinopril 10 mg daily (LF 11/29/2018 for 90 day); Brilinta 90 mg BID (LF 10/15/2018 for 30 day); uncertain intended duration of therapy nor if reduced dose to 60 mg daily has been discussed (appears to be >12 months since event) o Depression: trazodone 100 mg most evenings; bupropion 200 mg BID, not taking fluoxetine (LF 10/11/2018 for 30 day supply) but requesting options for improved anxiety  o COPD: Symbicort PRN - notes she had thrush once and doesn't want to get it again; Taking albuterol daily  o Constipation: not taking Linzess; LF 10/15/2018 for 30 day supply o Has not picked up ciprofloxacin yet for UTI  Pharmacist Clinical Goal(s):  Marland Kitchen Over the next 90 days, patient will work with PharmD and provider towards optimized medication management  Interventions: . Comprehensive medication review performed; medication list updated in electronic medical record . Patient denies hx of lung testing (PFT). Recommended she try taking Symbicort QAM, and brushing her teeth immediately afterward to prevent thrush. However, could also consider switching to daily LAMA therapy instead of ICS/LABA.  Marland Kitchen Hx NSTEMI; LDL not at goal <70. Consider addition of ezetimibe for optimal ASCVD risk reduction.  . Will plan to review cardiology appointment notes to see if any plans for Brilinta, and if patient  is supposed to continue taking.  . In future, will offer referral to LCSW for mental health and will screen for other financial concerns, Connected Care referral if needed  Patient Self Care Activities:  . Patient will take medications as prescribed  Initial goal documentation     . "I want to quit smoking" (pt-stated)       Current Barriers:  . 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 Previous quit attempts, successful using varenicline + NRT patches; however, her partner smoked around her and she started again  Pharmacist Clinical Goal(s):  Marland Kitchen Over the next 90 days, patient will work with PharmD and provider towards tobacco cessation  Interventions: . Unable to fully assess and recommend intervention, as patient had to go to get to cardiology appointment. Will continue to evaluate and provide support moving forward. Will likely provide information to [facility] Quitline.  Patient Self Care Activities:  . Patient will commit to reducing tobacco consumption  Initial goal documentation        The patient verbalized understanding of instructions provided today and declined a print copy of patient instruction materials.   Plan: - Will plan to outreach patient in the next 1-3 weeks to follow up on medication management questions above  Catie Darnelle Maffucci, PharmD Clinical Pharmacist [place] [phone]

## 2018-12-22 NOTE — Chronic Care Management (AMB) (Signed)
Chronic Care Management   Note  12/22/2018 Name: Natalie Taylor MRN: [id number] DOB: 11/03/1960   Subjective:  Natalie Taylor is a 58 y.o. year old female who is a primary care patient of Dorcas CarrowJohnson, Megan P, DO. The CCM team was consulted for assistance with chronic disease management and care coordination needs.    Spoke with patient today regarding medication management concerns. Had face to face appointment scheduled yesterday afternoon, but patient did not arrive. She notes that she reviewed her pills with her husband to see what she is no longer taking.  Review of patient status, including review of consultants reports, laboratory and other test data, was performed as part of comprehensive evaluation and provision of chronic care management services.   Objective:  Lab Results  Component Value Date   CREATININE 1.40 (H) 12/13/2018   CREATININE 1.32 (H) 03/04/2018   CREATININE 1.20 (H) 02/20/2018    Lab Results  Component Value Date   HGBA1C 5.3 02/18/2018       Component Value Date/Time   CHOL 132 12/13/2018 1414   TRIG 69 12/13/2018 1414   HDL 40 12/13/2018 1414   CHOLHDL 4.7 09/28/2015 0606   VLDL 17 09/28/2015 0606   LDLCALC 78 12/13/2018 1414    Clinical ASCVD: Yes  The ASCVD Risk score Denman George(Goff DC Jr., et al., 2013) failed to calculate for the following reasons:   The patient has a prior MI or stroke diagnosis    BP Readings from Last 3 Encounters:  12/13/18 133/74  05/06/18 130/80  04/29/18 (!) 92/57    Allergies  Allergen Reactions  . Cranberry Hives    Cranberry juice  . Tape Rash    Can use paper tape    Medications Reviewed Today    Reviewed by Natalie Taylor, Natalie Taylor, [facility] (Pharmacist) on 12/22/18 at 1333  Med List Status: <None>  Medication Order Taking? Sig Documenting Provider Last Dose Status Informant  albuterol (PROVENTIL HFA;VENTOLIN HFA) 108 (90 Base) MCG/ACT inhaler [id number] Yes Inhale 2 puffs into the lungs 2 (two) times daily. [provider] Taking Active Self  atorvastatin (LIPITOR) 80 MG tablet [id number] Yes Take 1 tablet (80 mg total) by mouth daily at 6 PM. Dorcas CarrowJohnson, Megan P, DO Taking Active   BRILINTA 90 MG TABS tablet [id number] Yes Take 1 tablet (90 mg total) by mouth 2 (two) times daily. Olevia PerchesJohnson, Megan P, DO Taking Active   budesonide-formoterol [facility](SYMBICORT) 160-4.5 MCG/ACT inhaler [id number]  Inhale 2 puffs into the lungs 2 (two) times daily. Johnson, Megan P, DO  Active   buPROPion (WELLBUTRIN SR) 200 MG 12 hr tablet [id number] Yes 1 tab daily for 1 week, then 1 tab BID Laural BenesJohnson, Megan P, DO Taking Active   ciprofloxacin (CIPRO) 500 MG tablet [id number] No Take 1 tablet (500 mg total) by mouth 2 (two) times daily.  Patient not taking: Reported on 12/22/2018   Dorcas CarrowJohnson, Megan P, DO Not Taking Active   FLUoxetine (PROZAC) 20 MG capsule [id number] No TAKE 3 CAPSULES DAILY.  Patient not taking: Reported on 12/13/2018   Dorcas CarrowJohnson, Megan P, DO Not Taking Active   levothyroxine (SYNTHROID, LEVOTHROID) 25 MCG tablet [id number] No Take 1 tablet (25 mcg total) by mouth daily at 6 (six) AM.  Patient not taking: Reported on 12/22/2018   Enedina FinnerPatel, Sona, MD Not Taking Active   linaclotide Karlene Einstein(LINZESS) 145 MCG CAPS capsule [id number] No Take 1 capsule (145 mcg total) by mouth daily before breakfast.  Patient not taking: Reported on 12/22/2018  Toney Reil, MD Not Taking Expired 08/04/18 2359   lisinopril (ZESTRIL) 10 MG tablet [id number] Yes Take 1 tablet (10 mg total) by mouth daily. Johnson, Megan P, DO Taking Active   metoprolol tartrate (LOPRESSOR) 50 MG tablet [id number] Yes Take 0.5 tablets (25 mg total) by mouth 2 (two) times daily. Laural Benes, Megan P, DO Taking Active   omeprazole (PRILOSEC) 20 MG capsule [id number] Yes Take 1 capsule (20 mg total) by mouth 2 (two) times daily before a meal. Laural Benes, Megan P, DO Taking Active   ondansetron (ZOFRAN-ODT) 4 MG disintegrating tablet [id number] Yes Take 1 tablet (4 mg total) by mouth every 8  (eight) hours as needed for nausea or vomiting. Dorcas Carrow, DO Taking Active            Med Note Natalie Taylor, Natalie Taylor   Wed Dec 22, 2018  1:25 PM) Feels like she needs to take every morning; dry heaving feeling  traZODone (DESYREL) 50 MG tablet [id number] Yes Take 1-2 tablets (50-100 mg total) by mouth at bedtime as needed for sleep. Dorcas Carrow, DO Taking Active            Med Note Natalie Taylor, [name]   Wed Dec 22, 2018  1:25 PM) Taking 2 tablets most evening  triamcinolone ointment (KENALOG) 0.5 % [id number] Yes Apply 1 application topically 2 (two) times daily. Laural Benes, Megan P, DO Taking Active   valACYclovir (VALTREX) 1000 MG tablet [id number] Yes Take 1 tablet (1,000 mg total) by mouth 2 (two) times daily. Dorcas Carrow, DO Taking Active            Assessment:   Goals Addressed            This Visit's Progress     Patient Stated   . "I have a lot of medications" (pt-stated)       Current Barriers:  . Polypharmacy; complex patient with multiple comorbidities including tobacco abuse, CAD hx NSTEMI, COPD, depression . Self-manages medications by using a pill box. Was unsure which medications she was taking at last PCP visit. Contacted [employer]; obtained refill hx to collaborate patient's review of her medications o CAD/hx NSTEMI: taking atorvastatin 80 mg daily (LF 11/29/2018 for 90 day); metoprolol tartrate 12.5 mg BID (LF 11/29/2018 for 90 day); lisinopril 10 mg daily (LF 11/29/2018 for 90 day); Brilinta 90 mg BID (LF 10/15/2018 for 30 day); uncertain intended duration of therapy nor if reduced dose to 60 mg daily has been discussed (appears to be >12 months since event) o Depression: trazodone 100 mg most evenings; bupropion 200 mg BID, not taking fluoxetine (LF 10/11/2018 for 30 day supply) but requesting options for improved anxiety  o COPD: Symbicort PRN - notes she had thrush once and doesn't want to get it again; Taking albuterol daily  o Constipation: not  taking Linzess; LF 10/15/2018 for 30 day supply o Has not picked up ciprofloxacin yet for UTI  Pharmacist Clinical Goal(s):  Marland Kitchen Over the next 90 days, patient will work with PharmD and provider towards optimized medication management  Interventions: . Comprehensive medication review performed; medication list updated in electronic medical record . Patient denies hx of lung testing (PFT). Recommended she try taking Symbicort QAM, and brushing her teeth immediately afterward to prevent thrush. However, could also consider switching to daily LAMA therapy instead of ICS/LABA.  Marland Kitchen Hx NSTEMI; LDL not at goal <70. Consider addition of ezetimibe for optimal ASCVD risk reduction.  . Will plan to review  cardiology appointment notes to see if any plans for Brilinta, and if patient is supposed to continue taking.  . In future, will offer referral to LCSW for mental health and will screen for other financial concerns, [facility] referral if needed  Patient Self Care Activities:  . Patient will take medications as prescribed  Initial goal documentation     . "I want to quit smoking" (pt-stated)       Current Barriers:  . 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 Previous quit attempts, successful using varenicline + NRT patches; however, her partner smoked around her and she started again  Pharmacist Clinical Goal(s):  Marland Kitchen Over the next 90 days, patient will work with PharmD and provider towards tobacco cessation  Interventions: . Unable to fully assess and recommend intervention, as patient had to go to get to cardiology appointment. Will continue to evaluate and provide support moving forward. Will likely provide information to [facility] Quitline.  Patient Self Care Activities:  . Patient will commit to reducing tobacco consumption  Initial goal documentation        Plan: - Will plan to outreach patient in the next 1-3 weeks to follow up on medication management questions above   Catie Darnelle Maffucci, PharmD Clinical Pharmacist [place] [phone]

## 2018-12-22 NOTE — Progress Notes (Signed)
Duplicate/error

## 2018-12-28 ENCOUNTER — Ambulatory Visit: Payer: Self-pay

## 2019-01-03 ENCOUNTER — Other Ambulatory Visit: Payer: Self-pay | Admitting: Internal Medicine

## 2019-01-03 DIAGNOSIS — I208 Other forms of angina pectoris: Secondary | ICD-10-CM

## 2019-01-03 DIAGNOSIS — R0602 Shortness of breath: Secondary | ICD-10-CM

## 2019-01-04 ENCOUNTER — Encounter: Payer: Self-pay | Admitting: Family Medicine

## 2019-01-04 ENCOUNTER — Other Ambulatory Visit: Payer: Self-pay

## 2019-01-04 ENCOUNTER — Other Ambulatory Visit: Payer: Self-pay | Admitting: Internal Medicine

## 2019-01-04 ENCOUNTER — Ambulatory Visit: Payer: Medicaid Other | Admitting: Family Medicine

## 2019-01-04 VITALS — BP 180/84 | HR 53 | Temp 98.4°F | Ht 65.0 in | Wt 148.0 lb

## 2019-01-04 DIAGNOSIS — Z596 Low income: Secondary | ICD-10-CM | POA: Diagnosis not present

## 2019-01-04 DIAGNOSIS — F332 Major depressive disorder, recurrent severe without psychotic features: Secondary | ICD-10-CM

## 2019-01-04 DIAGNOSIS — I208 Other forms of angina pectoris: Secondary | ICD-10-CM

## 2019-01-04 DIAGNOSIS — Z23 Encounter for immunization: Secondary | ICD-10-CM | POA: Diagnosis not present

## 2019-01-04 DIAGNOSIS — R0602 Shortness of breath: Secondary | ICD-10-CM

## 2019-01-04 DIAGNOSIS — I129 Hypertensive chronic kidney disease with stage 1 through stage 4 chronic kidney disease, or unspecified chronic kidney disease: Secondary | ICD-10-CM | POA: Diagnosis not present

## 2019-01-04 MED ORDER — FLUOXETINE HCL 20 MG PO CAPS: ORAL_CAPSULE | ORAL | 3 refills | Status: DC

## 2019-01-04 MED ORDER — ONDANSETRON 4 MG PO TBDP: 4.0000 mg | ORAL_TABLET | Freq: Three times a day (TID) | ORAL | 6 refills | Status: DC | PRN

## 2019-01-04 MED ORDER — LISINOPRIL 20 MG PO TABS: 20.0000 mg | ORAL_TABLET | Freq: Every day | ORAL | 3 refills | Status: DC

## 2019-01-04 NOTE — Assessment & Plan Note (Signed)
Not under good control. Will increase her lisinopril to 20mg  and recheck 3-4 weeks. Continue to follow with cardiology. Call with any concerns.

## 2019-01-04 NOTE — Progress Notes (Signed)
BP (!) 180/84   Pulse (!) 53   Temp 98.4 F (36.9 C) (Oral)   Ht 5\' 5"  (1.651 m)   Wt 148 lb (67.1 kg)   SpO2 96%   BMI 24.63 kg/m    Subjective:    Patient ID: Natalie Taylor, female    DOB: 02/04/1961, 58 y.o.   MRN: [id number]  HPI: Natalie Taylor is a 58 y.o. female  Chief Complaint  Patient presents with  . Depression    2157m f/u  . Hypertension  . Nausea    ondansetron refill   Saw her cardiologist on 12/22/18- notes not available for review at this time. Appears to be going to have a Stress test and an ECHO  HYPERTENSION Hypertension status: uncontrolled  Satisfied with current treatment? no Duration of hypertension: chronic BP monitoring frequency:  not checking BP medication side effects:  no Medication compliance: unclear Previous BP meds: lisinopril, metoprolol, brilinta Aspirin: no Recurrent headaches: yes Visual changes: no Palpitations: no Dyspnea: yes Chest pain: no Lower extremity edema: no Dizzy/lightheaded: yes  DEPRESSION- has been off her prozac for about 3 months.  Mood status: uncontrolled Satisfied with current treatment?: no Symptom severity: severe  Duration of current treatment : chronic Side effects: no Medication compliance: poor compliance Psychotherapy/counseling: no  Previous psychiatric medications: prozac, wellbutrin Depressed mood: yes Anxious mood: yes Anhedonia: no Significant weight loss or gain: no Insomnia: yes  Fatigue: yes Feelings of worthlessness or guilt: yes Impaired concentration/indecisiveness: yes Suicidal ideations: no Hopelessness: yes Crying spells: yes [facility] 01/04/2019 12/13/2018 03/04/2018 09/28/2017 04/24/2017  Decreased Interest 1 3 3 3 2   Down, Depressed, Hopeless 2 3 3 2 2   PHQ - 2 Score 3 6 6 5 4   Altered sleeping 2 2 3 3 3   Tired, decreased energy 1 3 3 1 3   Change in appetite 0 2 3 1 2   Feeling bad or failure about yourself  1 3 - 1 2  Trouble concentrating 2 3 2 2 2    Moving slowly or fidgety/restless 3 3 2 3 3   Suicidal thoughts 0 3 2 1 1   PHQ-9 Score 12 25 21 17 20   Difficult doing work/chores Very difficult Extremely dIfficult Very difficult - Very difficult  Some recent data might be hidden    Relevant past medical, surgical, family and social history reviewed and updated as indicated. Interim medical history since our last visit reviewed. Allergies and medications reviewed and updated.  Review of Systems  Constitutional: Negative.   HENT: Negative.   Respiratory: Positive for chest tightness and shortness of breath. Negative for apnea, cough, choking, wheezing and stridor.   Cardiovascular: Negative.   Gastrointestinal: Negative.   Musculoskeletal: Negative.   Skin: Negative.   Psychiatric/Behavioral: Positive for dysphoric mood. Negative for agitation, behavioral problems, confusion, decreased concentration, hallucinations, self-injury, sleep disturbance and suicidal ideas. The patient is nervous/anxious. The patient is not hyperactive.     Per HPI unless specifically indicated above     Objective:    BP (!) 180/84   Pulse (!) 53   Temp 98.4 F (36.9 C) (Oral)   Ht 5\' 5"  (1.651 m)   Wt 148 lb (67.1 kg)   SpO2 96%   BMI 24.63 kg/m   Wt Readings from Last 3 Encounters:  01/04/19 148 lb (67.1 kg)  12/13/18 147 lb (66.7 kg)  05/06/18 144 lb 6.4 oz (65.5 kg)    Physical Exam Vitals signs and nursing note reviewed.  Constitutional:  General: She is not in acute distress.    Appearance: Normal appearance. She is not ill-appearing, toxic-appearing or diaphoretic.  HENT:     Head: Normocephalic and atraumatic.     Right Ear: External ear normal.     Left Ear: External ear normal.     Nose: Nose normal.     Mouth/Throat:     Mouth: Mucous membranes are moist.     Pharynx: Oropharynx is clear.  Eyes:     General: No scleral icterus.       Right eye: No discharge.        Left eye: No discharge.     Extraocular Movements:  Extraocular movements intact.     Conjunctiva/sclera: Conjunctivae normal.     Pupils: Pupils are equal, round, and reactive to light.  Neck:     Musculoskeletal: Normal range of motion and neck supple.  Cardiovascular:     Rate and Rhythm: Normal rate and regular rhythm.     Pulses: Normal pulses.     Heart sounds: Normal heart sounds. No murmur. No friction rub. No gallop.   Pulmonary:     Effort: Pulmonary effort is normal. No respiratory distress.     Breath sounds: Normal breath sounds. No stridor. No wheezing, rhonchi or rales.  Chest:     Chest wall: No tenderness.  Musculoskeletal: Normal range of motion.  Skin:    General: Skin is warm and dry.     Capillary Refill: Capillary refill takes less than 2 seconds.     Coloration: Skin is not jaundiced or pale.     Findings: No bruising, erythema, lesion or rash.  Neurological:     General: No focal deficit present.     Mental Status: She is alert and oriented to person, place, and time. Mental status is at baseline.  Psychiatric:        Mood and Affect: Mood normal.        Behavior: Behavior normal.        Thought Content: Thought content normal.        Judgment: Judgment normal.     Results for orders placed or performed in visit on 12/13/18  Microscopic Examination   BLD  Result Value Ref Range   WBC, UA 11-30 (A) 0 - 5 /hpf   RBC None seen 0 - 2 /hpf   Epithelial Cells (non renal) 0-10 0 - 10 /hpf   Bacteria, UA Many (A) None seen/Few  Urine Culture, Reflex   BLD  Result Value Ref Range   Urine Culture, Routine Final report    Organism ID, Bacteria Comment   Comprehensive metabolic panel  Result Value Ref Range   Glucose 76 65 - 99 mg/dL   BUN 25 (H) 6 - 24 mg/dL   Creatinine, Ser 1.40 (H) 0.57 - 1.00 mg/dL   GFR calc non Af Amer 42 (L) >59 mL/min/1.73   GFR calc Af Amer 48 (L) >59 mL/min/1.73   BUN/Creatinine Ratio 18 9 - 23   Sodium 142 134 - 144 mmol/L   Potassium 4.4 3.5 - 5.2 mmol/L   Chloride 103 96  - 106 mmol/L   CO2 24 20 - 29 mmol/L   Calcium 9.2 8.7 - 10.2 mg/dL   Total Protein 7.4 6.0 - 8.5 g/dL   Albumin 4.1 3.8 - 4.9 g/dL   Globulin, Total 3.3 1.5 - 4.5 g/dL   Albumin/Globulin Ratio 1.2 1.2 - 2.2   Bilirubin Total 0.5 0.0 - 1.2 mg/dL  Alkaline Phosphatase 130 (H) 39 - 117 IU/L   AST 12 0 - 40 IU/L   ALT 10 0 - 32 IU/L  Lipid Panel w/o Chol/HDL Ratio  Result Value Ref Range   Cholesterol, Total 132 100 - 199 mg/dL   Triglycerides 69 0 - 149 mg/dL   HDL 40 >42 mg/dL   VLDL Cholesterol Cal 14 5 - 40 mg/dL   LDL Calculated 78 0 - 99 mg/dL  Microalbumin, Urine Waived  Result Value Ref Range   Microalb, Ur Waived 150 (H) 0 - 19 mg/L   Creatinine, Urine Waived 200 10 - 300 mg/dL   Microalb/Creat Ratio 30-300 (H) <30 mg/g  TSH  Result Value Ref Range   TSH 1.120 0.450 - 4.500 uIU/mL  UA/M w/rflx Culture, Routine   Specimen: Blood   BLD  Result Value Ref Range   Specific Gravity, UA 1.015 1.005 - 1.030   pH, UA 7.0 5.0 - 7.5   Color, UA Yellow Yellow   Appearance Ur Cloudy (A) Clear   Leukocytes,UA 1+ (A) Negative   Protein,UA 1+ (A) Negative/Trace   Glucose, UA Negative Negative   Ketones, UA Negative Negative   RBC, UA Negative Negative   Bilirubin, UA Negative Negative   Urobilinogen, Ur 0.2 0.2 - 1.0 mg/dL   Nitrite, UA Positive (A) Negative   Microscopic Examination See below:    Urinalysis Reflex Comment   CBC with Differential/Platelet  Result Value Ref Range   WBC 7.2 3.4 - 10.8 x10E3/uL   RBC 4.55 3.77 - 5.28 x10E6/uL   Hemoglobin 13.6 11.1 - 15.9 g/dL   Hematocrit 35.3 61.4 - 46.6 %   MCV 88 79 - 97 fL   MCH 29.9 26.6 - 33.0 pg   MCHC 33.9 31.5 - 35.7 g/dL   RDW 43.1 54.0 - 08.6 %   Platelets 239 150 - 450 x10E3/uL   Neutrophils 55 Not Estab. %   Lymphs 29 Not Estab. %   Monocytes 9 Not Estab. %   Eos 6 Not Estab. %   Basos 1 Not Estab. %   Neutrophils Absolute 4.0 1.4 - 7.0 x10E3/uL   Lymphocytes Absolute 2.1 0.7 - 3.1 x10E3/uL   Monocytes  Absolute 0.6 0.1 - 0.9 x10E3/uL   EOS (ABSOLUTE) 0.4 0.0 - 0.4 x10E3/uL   Basophils Absolute 0.1 0.0 - 0.2 x10E3/uL   Immature Granulocytes 0 Not Estab. %   Immature Grans (Abs) 0.0 0.0 - 0.1 x10E3/uL      Assessment & Plan:   Problem List Items Addressed This Visit      Genitourinary   Benign hypertensive renal disease - Primary    Not under good control. Will increase her lisinopril to 20mg  and recheck 3-4 weeks. Continue to follow with cardiology. Call with any concerns.         Other   Depression    Not under good control. Will restart prozac and titrate up as needed. Recheck 3-4 weeks. Call with any concerns.       Relevant Medications   FLUoxetine (PROZAC) 20 MG capsule    Other Visit Diagnoses    Flu vaccine need       Relevant Orders   Flu Vaccine QUAD 36+ mos IM (Completed)       Follow up plan: Return 3-4 weeks, for follow up mood and blood pressure.

## 2019-01-04 NOTE — Assessment & Plan Note (Signed)
Not under good control. Will restart prozac and titrate up as needed. Recheck 3-4 weeks. Call with any concerns.

## 2019-01-04 NOTE — Patient Instructions (Signed)
Take 2 of the 10mg  lisinoprils you have at home until you start the 20mg  just sent to the pharmacy today.

## 2019-01-13 ENCOUNTER — Ambulatory Visit: Admission: RE | Admit: 2019-01-13 | Payer: Medicaid Other | Source: Ambulatory Visit

## 2019-01-18 ENCOUNTER — Ambulatory Visit
Admission: RE | Admit: 2019-01-18 | Discharge: 2019-01-18 | Disposition: A | Discharge status: Home / Self Care | Payer: Medicaid Other | Source: Ambulatory Visit | Attending: Family Medicine | Admitting: Family Medicine

## 2019-01-18 ENCOUNTER — Encounter
Admission: RE | Admit: 2019-01-18 | Discharge: 2019-01-18 | Disposition: A | Discharge status: Home / Self Care | Payer: Medicaid Other | Source: Ambulatory Visit | Attending: Internal Medicine | Admitting: Internal Medicine

## 2019-01-18 ENCOUNTER — Other Ambulatory Visit: Payer: Self-pay

## 2019-01-18 DIAGNOSIS — Z596 Low income: Secondary | ICD-10-CM | POA: Diagnosis not present

## 2019-01-18 DIAGNOSIS — I208 Other forms of angina pectoris: Secondary | ICD-10-CM | POA: Insufficient documentation

## 2019-01-18 DIAGNOSIS — R0602 Shortness of breath: Secondary | ICD-10-CM | POA: Insufficient documentation

## 2019-01-18 DIAGNOSIS — M25552 Pain in left hip: Secondary | ICD-10-CM | POA: Insufficient documentation

## 2019-01-18 DIAGNOSIS — M25551 Pain in right hip: Secondary | ICD-10-CM

## 2019-01-18 MED ORDER — REGADENOSON 0.4 MG/5ML IV SOLN
0.4000 mg | Freq: Once | INTRAVENOUS | Status: AC
  Administered 2019-01-18: 0.4 mg via INTRAVENOUS

## 2019-01-18 MED ORDER — TECHNETIUM TC 99M TETROFOSMIN IV KIT
9.7640 | PACK | Freq: Once | INTRAVENOUS | Status: AC | PRN
  Administered 2019-01-18: 09:00:00 9.764 via INTRAVENOUS

## 2019-01-18 MED ORDER — TECHNETIUM TC 99M TETROFOSMIN IV KIT
29.5900 | PACK | Freq: Once | INTRAVENOUS | Status: AC | PRN
  Administered 2019-01-18: 29.59 via INTRAVENOUS

## 2019-01-19 LAB — NM MYOCAR MULTI W/SPECT W/WALL MOTION / EF
Estimated workload: 1 METS
Exercise duration (min): 1 min
Exercise duration (sec): 7 s
LV dias vol: 131 mL (ref 46–106)
LV sys vol: 83 mL
Peak HR: 100 {beats}/min
Percent HR: 60 %
Rest HR: 55 {beats}/min
SDS: 1
SRS: 13
SSS: 12
TID: 1.04

## 2019-01-26 ENCOUNTER — Telehealth: Payer: Self-pay | Admitting: Family Medicine

## 2019-01-26 NOTE — Telephone Encounter (Signed)
Please let her know that her hip x-rays came back normal. Thanks!

## 2019-01-26 NOTE — Telephone Encounter (Signed)
Patient notified

## 2019-01-31 ENCOUNTER — Telehealth: Payer: Self-pay

## 2019-01-31 NOTE — Telephone Encounter (Signed)
Called to follow up on pt. Left a vm. Will call back

## 2019-02-01 ENCOUNTER — Ambulatory Visit: Payer: Medicaid Other | Admitting: Family Medicine

## 2019-02-01 ENCOUNTER — Telehealth: Payer: Self-pay

## 2019-03-28 ENCOUNTER — Telehealth: Payer: Self-pay | Admitting: Family Medicine

## 2019-03-28 NOTE — Telephone Encounter (Signed)
° ° °  Called pt regarding Natalie Taylor Referral for transportation. Patient stated that she has DSS transportation set up and that it is suitable for her Doc appts as long as she has 3 days notice. She indicated that her issue is w/ getting her prescription medications from [place] if she doesn't already have an OV scheduled she indicated she would be interested in mail order options Referring to [place] for assistance.  [facility] ??[email or web address]   ??[phone]

## 2019-03-28 NOTE — Chronic Care Management (AMB) (Signed)
   Care Management   Note  03/28/2019 Name: Natalie Taylor MRN: [id number] DOB: 09/08/1960  Natalie Taylor is a 58 y.o. year old female who is a primary care patient of Valerie Roys, DO. Natalie Taylor is currently enrolled in care management services. An additional referral for Pharm D was placed.   Follow up plan: Telephone appointment with CCM team member scheduled for:04/12/2019  Glenna Durand, LPN Health Advisor, [place] [facility] ??[email or web address] ??[phone]

## 2019-04-12 ENCOUNTER — Ambulatory Visit: Payer: Medicaid Other | Admitting: Pharmacist

## 2019-04-12 DIAGNOSIS — I129 Hypertensive chronic kidney disease with stage 1 through stage 4 chronic kidney disease, or unspecified chronic kidney disease: Secondary | ICD-10-CM

## 2019-04-12 DIAGNOSIS — E782 Mixed hyperlipidemia: Secondary | ICD-10-CM

## 2019-04-12 DIAGNOSIS — J441 Chronic obstructive pulmonary disease with (acute) exacerbation: Secondary | ICD-10-CM

## 2019-04-12 MED ORDER — FLUOXETINE HCL 60 MG PO TABS: 1.0000 | ORAL_TABLET | Freq: Every day | ORAL | 0 refills | Status: DC

## 2019-04-12 MED ORDER — LEVOTHYROXINE SODIUM 25 MCG PO TABS: 25.0000 ug | ORAL_TABLET | Freq: Every day | ORAL | 0 refills | Status: DC

## 2019-04-12 MED ORDER — ONDANSETRON 4 MG PO TBDP: 4.0000 mg | ORAL_TABLET | Freq: Three times a day (TID) | ORAL | 6 refills | Status: DC | PRN

## 2019-04-12 NOTE — Addendum Note (Signed)
Addended by: Valerie Roys on: 04/12/2019 06:39 PM   Modules accepted: Orders

## 2019-04-12 NOTE — Patient Instructions (Signed)
Visit Information  Goals Addressed            This Visit's Progress     Patient Stated   . "I have a lot of medications" (pt-stated)       Current Barriers:  . Polypharmacy; complex patient with multiple comorbidities including tobacco abuse, CAD hx NSTEMI, COPD, depression . Self-manages medications by using a pill box. Wonders about getting medications from a pharmacy who can mail or who can deliver to her o CAD/hx NSTEMI: taking atorvastatin 80 mg daily; metoprolol tartrate 25 mg BID, lisinopril 20 mg daily; Brilinta 90 mg BID  o Depression: trazodone 100 mg most evenings; bupropion 200 mg BID; titrated fluoxetine to 60 mg daily  o COPD: Symbicort 160/4.5 mg BID, albuterol HFA PRN o Hypothyroidism: prescribed levothyroxine 25 mcg daily, though patient notes that she doesn't have  o Constipation, nausea: Linzess 145 mcg daily, ondansetron 4 mg PRN- notes she needs a refill on both of these medications  Pharmacist Clinical Goal(s):  Marland Kitchen Over the next 90 days, patient will work with PharmD and provider towards optimized medication management  Interventions: . Comprehensive medication review performed; medication list updated in electronic medical record . Collaborate w/ Dr. Wynetta Emery to send levothyroxine, Linzess, fluoxetine 60 mg, and ondansetron prescription to [facility].  . Printed med list, insurance information. Faxed to [facility]. Will follow up with them tomorrow regarding any addition fills needed.   Patient Self Care Activities:  . Patient will take medications as prescribed  Please see past updates related to this goal by clicking on the "Past Updates" button in the selected goal         The patient verbalized understanding of instructions provided today and declined a print copy of patient instruction materials.   Plan: - Will follow up with [place], PharmD, [place] [phone]

## 2019-04-12 NOTE — Chronic Care Management (AMB) (Signed)
Chronic Care Management   Follow Up Note   04/12/2019 Name: Natalie Taylor MRN: [id number] DOB: [birth year]  Referred by: Valerie Roys, DO Reason for referral : Chronic Care Management (Medication Management )   Natalie Taylor is a 58 y.o. year old female who is a primary care patient of Valerie Roys, DO. The CCM team was consulted for assistance with chronic disease management and care coordination needs.    Contacted patient for medication management review.  Review of patient status, including review of consultants reports, relevant laboratory and other test results, and collaboration with appropriate care team members and the patient's provider was performed as part of comprehensive patient evaluation and provision of chronic care management services.    SDOH (Social Determinants of Health) screening performed today: Transportation. See Care Plan for related entries.   Outpatient Encounter Medications as of 04/12/2019  Medication Sig Note  . albuterol (PROVENTIL HFA;VENTOLIN HFA) 108 (90 Base) MCG/ACT inhaler Inhale 2 puffs into the lungs 2 (two) times daily.   Marland Kitchen atorvastatin (LIPITOR) 80 MG tablet Take 1 tablet (80 mg total) by mouth daily at 6 PM.   . BRILINTA 90 MG TABS tablet Take 1 tablet (90 mg total) by mouth 2 (two) times daily.   . budesonide-formoterol (SYMBICORT) 160-4.5 MCG/ACT inhaler Inhale 2 puffs into the lungs 2 (two) times daily. 04/12/2019: Taking 2 puffs daily  . buPROPion (WELLBUTRIN SR) 200 MG 12 hr tablet 1 tab daily for 1 week, then 1 tab BID 04/12/2019: Taking BID  . FLUoxetine (PROZAC) 20 MG capsule 1 capsule by mouth for 2 weeks, then increase to 2 caps daily 04/12/2019: Taking 3 capsules daily  . linaclotide (LINZESS) 145 MCG CAPS capsule Take 145 mcg by mouth daily before breakfast.   . lisinopril (ZESTRIL) 20 MG tablet Take 1 tablet (20 mg total) by mouth daily.   . metoprolol tartrate (LOPRESSOR) 50 MG tablet Take 0.5 tablets (25 mg total) by  mouth 2 (two) times daily.   Marland Kitchen omeprazole (PRILOSEC) 20 MG capsule Take 1 capsule (20 mg total) by mouth 2 (two) times daily before a meal.   . ondansetron (ZOFRAN-ODT) 4 MG disintegrating tablet Take 1 tablet (4 mg total) by mouth every 8 (eight) hours as needed for nausea or vomiting.   . traZODone (DESYREL) 50 MG tablet Take 1-2 tablets (50-100 mg total) by mouth at bedtime as needed for sleep. 12/22/2018: Taking 2 tablets most evening  . triamcinolone ointment (KENALOG) 0.5 % Apply 1 application topically 2 (two) times daily.   . valACYclovir (VALTREX) 1000 MG tablet Take 1 tablet (1,000 mg total) by mouth 2 (two) times daily.   Marland Kitchen levothyroxine (SYNTHROID, LEVOTHROID) 25 MCG tablet Take 1 tablet (25 mcg total) by mouth daily at 6 (six) AM. (Patient not taking: Reported on 12/22/2018)    No facility-administered encounter medications on file as of 04/12/2019.     Goals Addressed            This Visit's Progress     Patient Stated   . "I have a lot of medications" (pt-stated)       Current Barriers:  . Polypharmacy; complex patient with multiple comorbidities including tobacco abuse, CAD hx NSTEMI, COPD, depression . Self-manages medications by using a pill box. Wonders about getting medications from a pharmacy who can mail or who can deliver to her o CAD/hx NSTEMI: taking atorvastatin 80 mg daily; metoprolol tartrate 25 mg BID, lisinopril 20 mg daily; Brilinta 90 mg BID  o Depression: trazodone 100 mg most evenings; bupropion 200 mg BID; titrated fluoxetine to 60 mg daily  o COPD: Symbicort 160/4.5 mg BID, albuterol HFA PRN o Hypothyroidism: prescribed levothyroxine 25 mcg daily, though patient notes that she doesn't have  o Constipation, nausea: Linzess 145 mcg daily, ondansetron 4 mg PRN- notes she needs a refill on both of these medications  Pharmacist Clinical Goal(s):  Marland Kitchen Over the next 90 days, patient will work with PharmD and provider towards optimized medication  management  Interventions: . Comprehensive medication review performed; medication list updated in electronic medical record . Collaborate w/ Dr. Laural Benes to send levothyroxine, Linzess, fluoxetine 60 mg, and ondansetron prescription to [facility].  . Printed med list, insurance information. Faxed to [facility]. Will follow up with them tomorrow regarding any addition fills needed.   Patient Self Care Activities:  . Patient will take medications as prescribed  Please see past updates related to this goal by clicking on the "Past Updates" button in the selected goal          Plan: - Will follow up with [facility] tomorrow  Catie Feliz Beam, PharmD, [facility] Clinical Pharmacist [facility] [phone]

## 2019-04-13 ENCOUNTER — Ambulatory Visit: Payer: Self-pay | Admitting: Pharmacist

## 2019-04-13 ENCOUNTER — Other Ambulatory Visit: Payer: Self-pay | Admitting: Pharmacist

## 2019-04-13 DIAGNOSIS — K5909 Other constipation: Secondary | ICD-10-CM

## 2019-04-13 DIAGNOSIS — I129 Hypertensive chronic kidney disease with stage 1 through stage 4 chronic kidney disease, or unspecified chronic kidney disease: Secondary | ICD-10-CM

## 2019-04-13 DIAGNOSIS — E782 Mixed hyperlipidemia: Secondary | ICD-10-CM

## 2019-04-13 MED ORDER — ALBUTEROL SULFATE HFA 108 (90 BASE) MCG/ACT IN AERS: 2.0000 | INHALATION_SPRAY | Freq: Two times a day (BID) | RESPIRATORY_TRACT | 2 refills | Status: DC

## 2019-04-13 NOTE — Telephone Encounter (Signed)
Heard from pharmacist at [facility]; they are requesting new prescriptions be sent to them for Brilinta and Ventolin (there were not active refills to transfer from [facility]).

## 2019-04-13 NOTE — Patient Instructions (Signed)
Visit Information  Goals Addressed            This Visit's Progress     Patient Stated   . "I have a lot of medications" (pt-stated)       Current Barriers:  . Polypharmacy; complex patient with multiple comorbidities including tobacco abuse, CAD hx NSTEMI, COPD, depression . Self-manages medications by using a pill box. Decided to transfer medications to [facility], as they offer deliver. o CAD/hx NSTEMI: taking atorvastatin 80 mg daily; metoprolol tartrate 25 mg BID, lisinopril 20 mg daily; Brilinta 90 mg BID  o Depression: trazodone 100 mg most evenings; bupropion 200 mg BID; titrated fluoxetine to 60 mg daily  o COPD: Symbicort 160/4.5 mg BID, albuterol HFA PRN o Hypothyroidism: levothyroxine 25 mcg daily, o Constipation, nausea: Linzess 145 mcg daily, ondansetron 4 mg PRN; Linzess prescribed by Dr. Marius Ditch, last appt 04/2018. Patient was due to follow up in 3 months.   Pharmacist Clinical Goal(s):  Marland Kitchen Over the next 90 days, patient will work with PharmD and provider towards optimized medication management  Interventions: . Contacted [facility]. They received my fax w/ patients medication list, but have not had a chance to contact [place] for refills. Reiterated to contact me with any needs, once they get a chance to go through and transfer medications . Attempted to contact patient to discuss that she needs to re-establish w/ GI for Linzess refills. Was unable to leave a message.   Patient Self Care Activities:  . Patient will take medications as prescribed  Please see past updates related to this goal by clicking on the "Past Updates" button in the selected goal         The patient verbalized understanding of instructions provided today and declined a print copy of patient instruction materials.  Plan: - Scheduled follow up with patient on 05/04/2019 at 9 am  Catie Darnelle Maffucci, PharmD, [place] [phone]

## 2019-04-13 NOTE — Chronic Care Management (AMB) (Signed)
Chronic Care Management   Follow Up Note   04/13/2019 Name: Natalie Taylor MRN: [id number] DOB: Oct 22, 1960  Referred by: Valerie Roys, DO Reason for referral : Chronic Care Management (Medication Management)   Natalie Taylor is a 58 y.o. year old female who is a primary care patient of Valerie Roys, DO. The CCM team was consulted for assistance with chronic disease management and care coordination needs.  Care coordination completed today.     Review of patient status, including review of consultants reports, relevant laboratory and other test results, and collaboration with appropriate care team members and the patient's provider was performed as part of comprehensive patient evaluation and provision of chronic care management services.    SDOH (Social Determinants of Health) screening performed today: Transportation. See Care Plan for related entries.   Outpatient Encounter Medications as of 04/13/2019  Medication Sig Note  . albuterol (PROVENTIL HFA;VENTOLIN HFA) 108 (90 Base) MCG/ACT inhaler Inhale 2 puffs into the lungs 2 (two) times daily.   Marland Kitchen atorvastatin (LIPITOR) 80 MG tablet Take 1 tablet (80 mg total) by mouth daily at 6 PM.   . BRILINTA 90 MG TABS tablet Take 1 tablet (90 mg total) by mouth 2 (two) times daily.   . budesonide-formoterol (SYMBICORT) 160-4.5 MCG/ACT inhaler Inhale 2 puffs into the lungs 2 (two) times daily. 04/12/2019: Taking 2 puffs daily  . buPROPion (WELLBUTRIN SR) 200 MG 12 hr tablet 1 tab daily for 1 week, then 1 tab BID 04/12/2019: Taking BID  . FLUoxetine HCl 60 MG TABS Take 1 tablet by mouth daily.   Marland Kitchen levothyroxine (SYNTHROID) 25 MCG tablet Take 1 tablet (25 mcg total) by mouth daily at 6 (six) AM.   . linaclotide (LINZESS) 145 MCG CAPS capsule Take 145 mcg by mouth daily before breakfast.   . lisinopril (ZESTRIL) 20 MG tablet Take 1 tablet (20 mg total) by mouth daily.   . metoprolol tartrate (LOPRESSOR) 50 MG tablet Take 0.5 tablets (25 mg  total) by mouth 2 (two) times daily.   Marland Kitchen omeprazole (PRILOSEC) 20 MG capsule Take 1 capsule (20 mg total) by mouth 2 (two) times daily before a meal.   . ondansetron (ZOFRAN-ODT) 4 MG disintegrating tablet Take 1 tablet (4 mg total) by mouth every 8 (eight) hours as needed for nausea or vomiting.   . traZODone (DESYREL) 50 MG tablet Take 1-2 tablets (50-100 mg total) by mouth at bedtime as needed for sleep. 12/22/2018: Taking 2 tablets most evening  . triamcinolone ointment (KENALOG) 0.5 % Apply 1 application topically 2 (two) times daily.   . valACYclovir (VALTREX) 1000 MG tablet Take 1 tablet (1,000 mg total) by mouth 2 (two) times daily.    No facility-administered encounter medications on file as of 04/13/2019.     Goals Addressed            This Visit's Progress     Patient Stated   . "I have a lot of medications" (pt-stated)       Current Barriers:  . Polypharmacy; complex patient with multiple comorbidities including tobacco abuse, CAD hx NSTEMI, COPD, depression . Self-manages medications by using a pill box. Decided to transfer medications to [facility], as they offer deliver. o CAD/hx NSTEMI: taking atorvastatin 80 mg daily; metoprolol tartrate 25 mg BID, lisinopril 20 mg daily; Brilinta 90 mg BID  o Depression: trazodone 100 mg most evenings; bupropion 200 mg BID; titrated fluoxetine to 60 mg daily  o COPD: Symbicort 160/4.5 mg BID,  albuterol HFA PRN o Hypothyroidism: levothyroxine 25 mcg daily, o Constipation, nausea: Linzess 145 mcg daily, ondansetron 4 mg PRN; Linzess prescribed by Dr. Allegra Lai, last appt 04/2018. Patient was due to follow up in 3 months.   Pharmacist Clinical Goal(s):  Marland Kitchen Over the next 90 days, patient will work with PharmD and provider towards optimized medication management  Interventions: . Contacted [facility]. They received my fax w/ patients medication list, but have not had a chance to contact [place] for refills. Reiterated to contact me with  any needs, once they get a chance to go through and transfer medications . Attempted to contact patient to discuss that she needs to re-establish w/ GI for Linzess refills. Was unable to leave a message.   Patient Self Care Activities:  . Patient will take medications as prescribed  Please see past updates related to this goal by clicking on the "Past Updates" button in the selected goal          Plan: - Scheduled follow up with patient on 05/04/2019 at 9 am  Catie Feliz Beam, PharmD, [facility] Clinical Pharmacist [facility] [phone]

## 2019-04-13 NOTE — Telephone Encounter (Signed)
Albuterol sent. Really needs to get her brilinta from cardiology who manages it.

## 2019-05-04 ENCOUNTER — Telehealth: Payer: Self-pay

## 2019-05-04 ENCOUNTER — Ambulatory Visit: Payer: Self-pay | Admitting: Pharmacist

## 2019-05-04 NOTE — Chronic Care Management (AMB) (Signed)
  Chronic Care Management   Note  05/04/2019 Name: Natalie Taylor MRN: [id number] DOB: 01-Feb-1961  Natalie Taylor is a 59 y.o. year old female who is a primary care patient of Dorcas Carrow, DO. The CCM team was consulted for assistance with chronic disease management and care coordination needs.    Contacted patient, spoke with her emergency contact, [name]. He noted that she was asleep, but that he would pass along the message to call me back later today. She had not called me back by end of business.   Follow up plan: - Will collaborate w/ Care Guide to outreach patient to schedule follow up  Catie Feliz Beam, PharmD, [facility] Clinical Pharmacist [facility] Practice[facility] [phone]

## 2019-05-05 ENCOUNTER — Telehealth: Payer: Self-pay | Admitting: Family Medicine

## 2019-05-05 NOTE — Chronic Care Management (AMB) (Signed)
  Care Management   Note  05/05/2019 Name: TINAMARIE PRZYBYLSKI MRN: [id number] DOB: 05-09-1960  BELVIA GOTSCHALL is a 59 y.o. year old female who is a primary care patient of Dorcas Carrow, DO and is actively engaged with the care management team. I reached out to Maryfrances Bunnell by phone today to assist with re-scheduling a follow up appointment with the Pharmacist  Follow up plan: Telephone appointment with care management team member scheduled for: 05/27/19  [facility] Guide, Embedded Care Coordination [facility]  [place], [place] [address] Direct Dial: [phone] [email or web address]  [email or web address]

## 2019-05-27 ENCOUNTER — Ambulatory Visit: Payer: Medicaid Other

## 2019-05-27 NOTE — Chronic Care Management (AMB) (Signed)
  Chronic Care Management   Note  05/27/2019 Name: Natalie Taylor MRN: [id number] DOB: 03-04-1961  [name] is a 59 y.o. year old female who is a primary care patient of Dorcas Carrow, DO. The CCM team was consulted for assistance with chronic disease management and care coordination needs.    Contacted patient as scheduled for medication management review. She first noted she was busy and would call me back later this afternoon. When I called back later in the afternoon, she was not home and did not have her medications for review, asked that we reschedule. She noted that there were some medications she was out of; she has been waiting on [facility], expecting they would automatically refill. Encouraged her to review her bottles, and call [facility] with which she needs refills on. Rescheduled pharmacist phone call for 06/03/19.  Catie Feliz Beam, PharmD, [facility] Clinical Pharmacist [facility] [phone]

## 2019-05-31 ENCOUNTER — Other Ambulatory Visit: Payer: Self-pay | Admitting: Gastroenterology

## 2019-05-31 ENCOUNTER — Other Ambulatory Visit: Payer: Self-pay | Admitting: Family Medicine

## 2019-05-31 NOTE — Telephone Encounter (Signed)
Requested Prescriptions  Pending Prescriptions Disp Refills  . valACYclovir (VALTREX) 1000 MG tablet [Pharmacy Med Name: VALACYCLOVIR HCL 1 GM TAB] 20 tablet 3    Sig: TAKE 1 TABLET BY MOUTH TWICE DAILY     Antimicrobials:  Antiviral Agents - Anti-Herpetic Passed - 05/31/2019  1:19 PM      Passed - Valid encounter within last 12 months    Recent Outpatient Visits          4 months ago Benign hypertensive renal disease   [facility] [place], Megan P, DO   5 months ago Benign hypertensive renal disease   [facility] [place], [place], DO   1 year ago Chronic constipation   [facility] [place], [place], DO   1 year ago Acute colitis   [facility] [place], Megan P, DO   1 year ago Chronic bilateral low back pain with bilateral sciatica   [facility] [place], Megan P, DO             . levothyroxine (SYNTHROID) 25 MCG tablet [Pharmacy Med Name: LEVOTHYROXINE SODIUM 25 MCG TAB] 30 tablet 0    Sig: TAKE 1 TABLET BY MOUTH ONCE DAILY ON AN EMPTY STOMACH AT 6AM. WAIT 30 MINUTES BEFORE TAKING OTHER MEDS.     Endocrinology:  Hypothyroid Agents Failed - 05/31/2019  1:19 PM      Failed - TSH needs to be rechecked within 3 months after an abnormal result. Refill until TSH is due.      Passed - TSH in normal range and within 360 days    TSH  Date Value Ref Range Status  12/13/2018 1.120 0.450 - 4.500 uIU/mL Final         Passed - Valid encounter within last 12 months    Recent Outpatient Visits          4 months ago Benign hypertensive renal disease   [facility] [place], Megan P, DO   5 months ago Benign hypertensive renal disease   [facility] [place], [place], DO   1 year ago Chronic constipation   [facility] [place], [place], DO   1 year ago Acute colitis   [facility], Megan P, DO   1 year ago Chronic bilateral low back pain with bilateral sciatica   [facility] [place], Megan P, DO

## 2019-05-31 NOTE — Telephone Encounter (Signed)
Medication Refill - Medication: linzess   Has the patient contacted their pharmacy? Yes.   (Agent: If no, request that the patient contact the pharmacy for the refill.) (Agent: If yes, when and what did the pharmacy advise?)  Preferred Pharmacy (with phone number or street name):  [facility] - [place], [place] - [address]  [address] [place] [address]  Phone: [phone] Fax: [phone]  Not a 24 hour pharmacy; exact hours not known.     Agent: Please be advised that RX refills may take up to 3 business days. We ask that you follow-up with your pharmacy.

## 2019-06-03 ENCOUNTER — Ambulatory Visit: Payer: Medicaid Other | Admitting: Pharmacist

## 2019-06-03 DIAGNOSIS — E782 Mixed hyperlipidemia: Secondary | ICD-10-CM

## 2019-06-03 DIAGNOSIS — I252 Old myocardial infarction: Secondary | ICD-10-CM

## 2019-06-03 DIAGNOSIS — I129 Hypertensive chronic kidney disease with stage 1 through stage 4 chronic kidney disease, or unspecified chronic kidney disease: Secondary | ICD-10-CM

## 2019-06-03 NOTE — Patient Instructions (Signed)
Visit Information  Goals Addressed            This Visit's Progress     Patient Stated   . "I have a lot of medications" (pt-stated)       Current Barriers:  . Polypharmacy; complex patient with multiple comorbidities including tobacco abuse, CAD hx NSTEMI, COPD, depression . Overdue for f/u with Dr. Laural Benes. She notes today that she feels her depression is not as well controlled as it could be.  . Self-manages medications by using a pill box. Decided to transfer medications to [facility], as they offer delivery and she is interested in pill packaging.  o CAD/hx NSTEMI: taking atorvastatin 80 mg daily; metoprolol tartrate 25 mg BID, lisinopril 20 mg daily; Brilinta 90 mg BID  - reports she had been out of Brilinta for a bit, but a refill was called in by Cardiology earlier this week.  o Depression: trazodone 100 mg most evenings; bupropion 200 mg BID; fluoxetine to 60 mg daily  o COPD: Symbicort 160/4.5 mg BID, albuterol HFA PRN o Hypothyroidism: levothyroxine 25 mcg daily- notes that she is out of this o Constipation, nausea: ondansetron QAM; is not on Linzess as she needs f/u with GI  Pharmacist Clinical Goal(s):  Marland Kitchen Over the next 90 days, patient will work with PharmD and provider towards optimized medication management  Interventions: . Comprehensive medication review performed, medication list updated in electronic medical record. Reviewed that she did not have levothyroxine, valacyclovir, and Brilinta. Both levothyroxine and valacyclovir were called to [facility] on 2/9; can see a phone note that cardiology called Brilinta in, but [facility] did not have an updated script on file. Called [facility], confirmed that cardiology had inadvertently called to [facility]. Asked [facility] to transfer Brilinta from [facility]. Also asked that they deliver medications to patient, given her transportation concerns. They verbalized understanding . Reviewed with patient that she needs follow  up w/ Dr. Laural Benes. She has since called and scheduled an appointment on 06/24/19. After that time and any medication regimen changes are made, we will pursue pill packaging w/ [facility].   Patient Self Care Activities:  . Patient will take medications as prescribed  Please see past updates related to this goal by clicking on the "Past Updates" button in the selected goal         The patient verbalized understanding of instructions provided today and declined a print copy of patient instruction materials.   Plan:  - Scheduled f/u call 07/06/19  Catie Feliz Beam, PharmD, [facility] Clinical Pharmacist [facility] [phone]

## 2019-06-03 NOTE — Chronic Care Management (AMB) (Signed)
Chronic Care Management   Follow Up Note   06/03/2019 Name: Natalie Taylor MRN: [id number] DOB: [birth year]  Referred by: Valerie Roys, DO Reason for referral : Chronic Care Management (Medication Management)   Natalie Taylor is a 59 y.o. year old female who is a primary care patient of Valerie Roys, DO. The CCM team was consulted for assistance with chronic disease management and care coordination needs.    Contacted patient for medication management review.   Review of patient status, including review of consultants reports, relevant laboratory and other test results, and collaboration with appropriate care team members and the patient's provider was performed as part of comprehensive patient evaluation and provision of chronic care management services.    SDOH (Social Determinants of Health) screening performed today: None. See Care Plan for related entries.   Outpatient Encounter Medications as of 06/03/2019  Medication Sig Note  . albuterol (VENTOLIN HFA) 108 (90 Base) MCG/ACT inhaler Inhale 2 puffs into the lungs 2 (two) times daily.   Marland Kitchen atorvastatin (LIPITOR) 80 MG tablet Take 1 tablet (80 mg total) by mouth daily at 6 PM.   . budesonide-formoterol (SYMBICORT) 160-4.5 MCG/ACT inhaler Inhale 2 puffs into the lungs 2 (two) times daily. 04/12/2019: Taking 2 puffs daily  . buPROPion (WELLBUTRIN SR) 200 MG 12 hr tablet 1 tab daily for 1 week, then 1 tab BID 04/12/2019: Taking BID  . FLUoxetine HCl 60 MG TABS Take 1 tablet by mouth daily.   Marland Kitchen lisinopril (ZESTRIL) 20 MG tablet Take 1 tablet (20 mg total) by mouth daily.   . metoprolol tartrate (LOPRESSOR) 50 MG tablet Take 0.5 tablets (25 mg total) by mouth 2 (two) times daily.   Marland Kitchen omeprazole (PRILOSEC) 20 MG capsule Take 1 capsule (20 mg total) by mouth 2 (two) times daily before a meal.   . ondansetron (ZOFRAN-ODT) 4 MG disintegrating tablet Take 1 tablet (4 mg total) by mouth every 8 (eight) hours as needed for nausea or  vomiting. 06/03/2019: Taking QAM  . traZODone (DESYREL) 50 MG tablet Take 1-2 tablets (50-100 mg total) by mouth at bedtime as needed for sleep. 12/22/2018: Taking 2 tablets most evening  . triamcinolone ointment (KENALOG) 0.5 % Apply 1 application topically 2 (two) times daily.   Marland Kitchen BRILINTA 90 MG TABS tablet Take 1 tablet (90 mg total) by mouth 2 (two) times daily. (Patient not taking: Reported on 06/03/2019)   . levothyroxine (SYNTHROID) 25 MCG tablet TAKE 1 TABLET BY MOUTH ONCE DAILY ON AN EMPTY STOMACH AT 6AM. WAIT 30 MINUTES BEFORE TAKING OTHER MEDS. (Patient not taking: Reported on 06/03/2019)   . linaclotide (LINZESS) 145 MCG CAPS capsule Take 145 mcg by mouth daily before breakfast.   . valACYclovir (VALTREX) 1000 MG tablet TAKE 1 TABLET BY MOUTH TWICE DAILY (Patient not taking: Reported on 06/03/2019)    No facility-administered encounter medications on file as of 06/03/2019.     Objective:   Goals Addressed            This Visit's Progress     Patient Stated   . "I have a lot of medications" (pt-stated)       Current Barriers:  . Polypharmacy; complex patient with multiple comorbidities including tobacco abuse, CAD hx NSTEMI, COPD, depression . Overdue for f/u with Dr. Wynetta Emery. She notes today that she feels her depression is not as well controlled as it could be.  . Self-manages medications by using a pill box. Decided to transfer medications to [facility], as they offer delivery and she is interested in pill packaging.  o CAD/hx NSTEMI: taking atorvastatin 80 mg daily; metoprolol tartrate 25 mg BID, lisinopril 20 mg daily; Brilinta 90 mg BID  - reports she had been out of Brilinta for a bit, but a refill was called in by Cardiology earlier this week.  o Depression: trazodone 100 mg most evenings; bupropion 200 mg BID; fluoxetine to 60 mg daily  o COPD: Symbicort 160/4.5 mg BID, albuterol HFA PRN o Hypothyroidism: levothyroxine 25 mcg daily- notes that she is out of  this o Constipation, nausea: ondansetron QAM; is not on Linzess as she needs f/u with GI  Pharmacist Clinical Goal(s):  Marland Kitchen Over the next 90 days, patient will work with PharmD and provider towards optimized medication management  Interventions: . Comprehensive medication review performed, medication list updated in electronic medical record. Reviewed that she did not have levothyroxine, valacyclovir, and Brilinta. Both levothyroxine and valacyclovir were called to [facility] on 2/9; can see a phone note that cardiology called Brilinta in, but [facility] did not have an updated script on file. Called [facility], confirmed that cardiology had inadvertently called to [facility]. Asked [facility] to transfer Brilinta from [facility]. Also asked that they deliver medications to patient, given her transportation concerns. They verbalized understanding . Reviewed with patient that she needs follow up w/ Dr. Laural Benes. She has since called and scheduled an appointment on 06/24/19. After that time and any medication regimen changes are made, we will pursue pill packaging w/ [facility].   Patient Self Care Activities:  . Patient will take medications as prescribed  Please see past updates related to this goal by clicking on the "Past Updates" button in the selected goal          Plan:  - Scheduled f/u call 07/06/19  Catie Feliz Beam, PharmD, [facility] Clinical Pharmacist [facility] [phone]

## 2019-06-10 IMAGING — CT CT ABD-PELV W/O CM
2 of 4 series · 15 of 46 positions shown, 17 images · non-contrast
Comparison: None.

CLINICAL DATA: Lower right abdominal pain and vomiting x3.

EXAM:
CT ABDOMEN AND PELVIS WITHOUT CONTRAST
TECHNIQUE: Multidetector CT imaging of the abdomen and pelvis was performed
following the standard protocol without IV contrast.

[Series 2: routine abd/pel wo · axial · 0.65mm/px · z∈[-866,-426]mm · 12 of 97 slices shown, 14 images]
[im 5/97  soft-tissue]
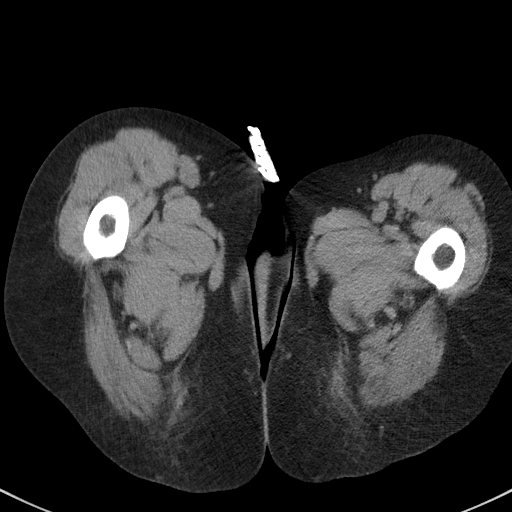
[im 5/97  bone]
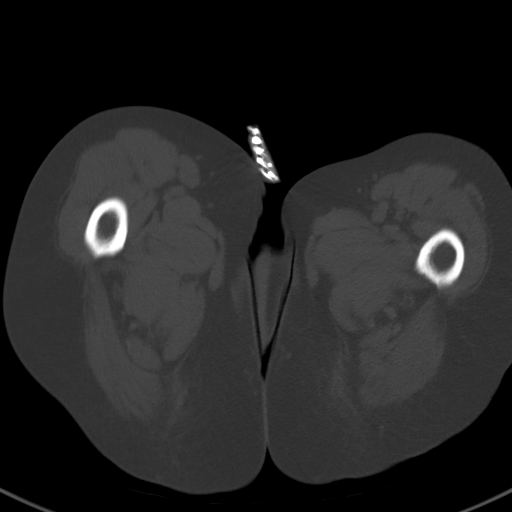
[im 13/97  soft-tissue]
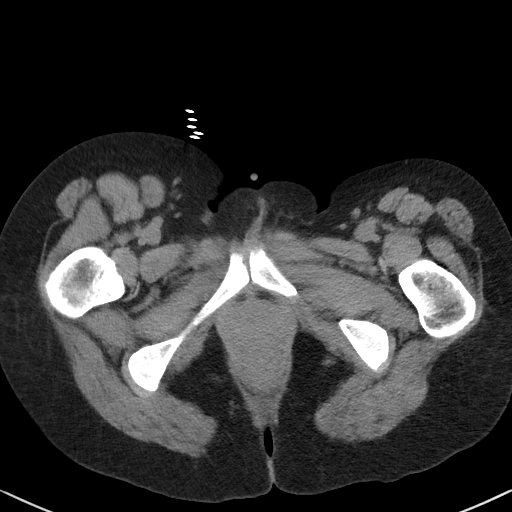
[im 21/97  soft-tissue]
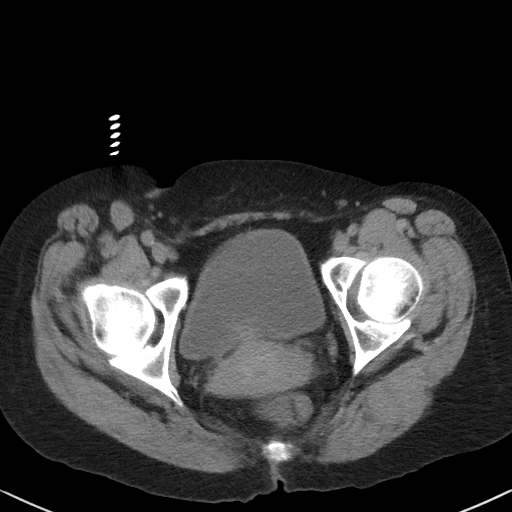
[im 29/97  soft-tissue]
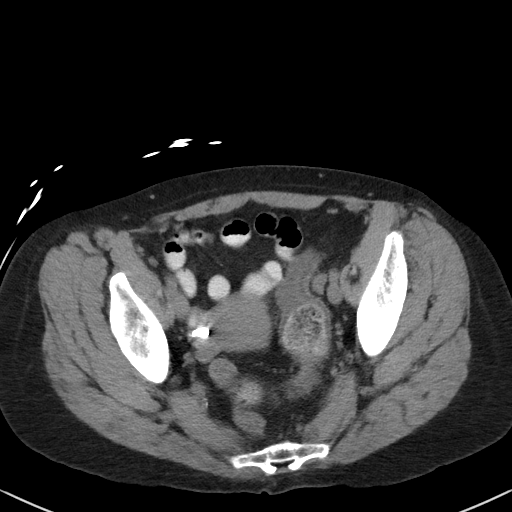
[im 37/97  soft-tissue]
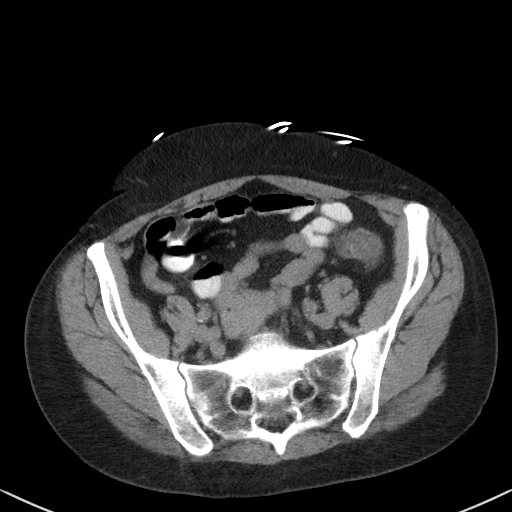
[im 45/97  soft-tissue]
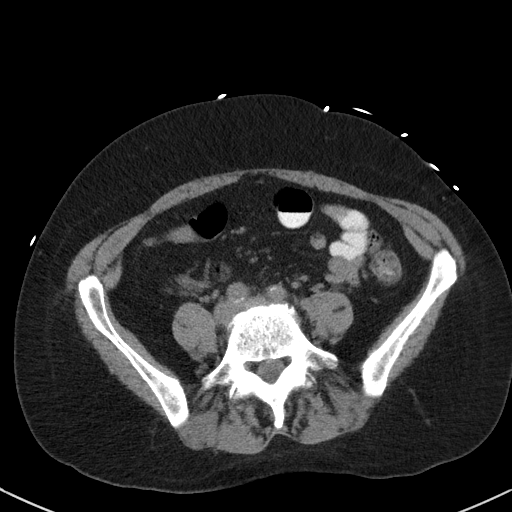
[im 53/97  soft-tissue]
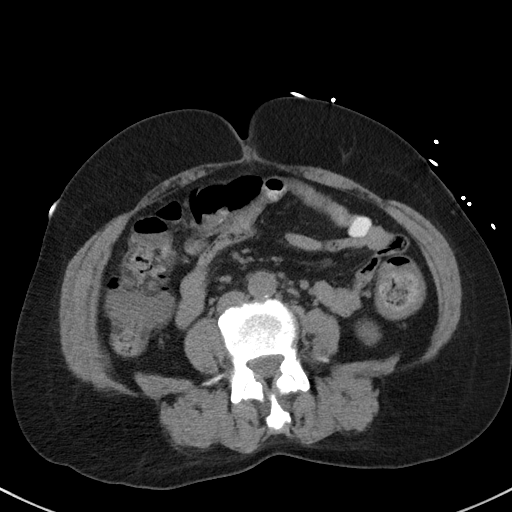
[im 61/97  soft-tissue]
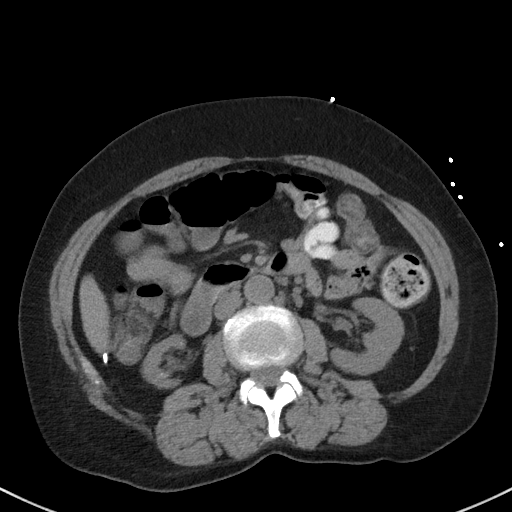
[im 69/97  soft-tissue]
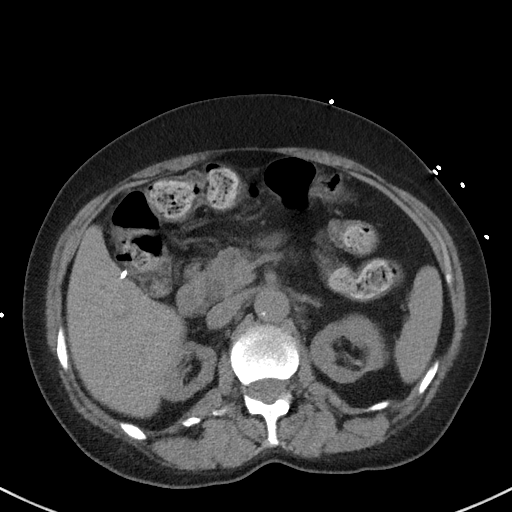
[im 69/97  bone]
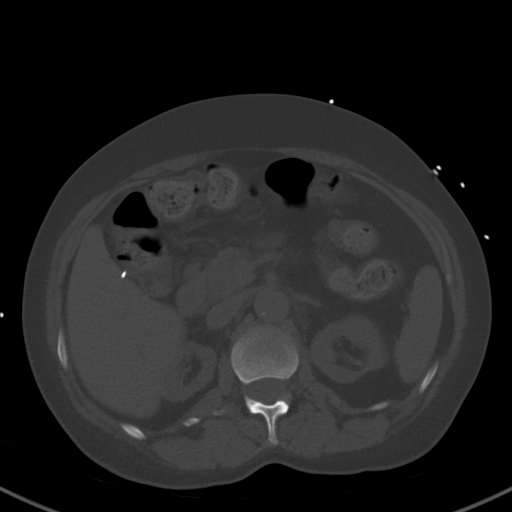
[im 77/97  soft-tissue]
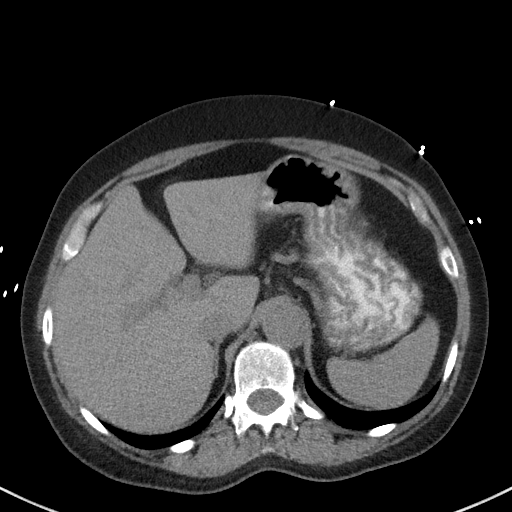
[im 85/97  soft-tissue]
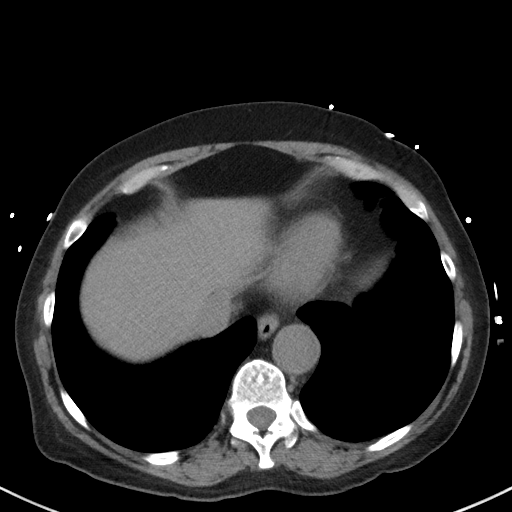
[im 93/97  soft-tissue]
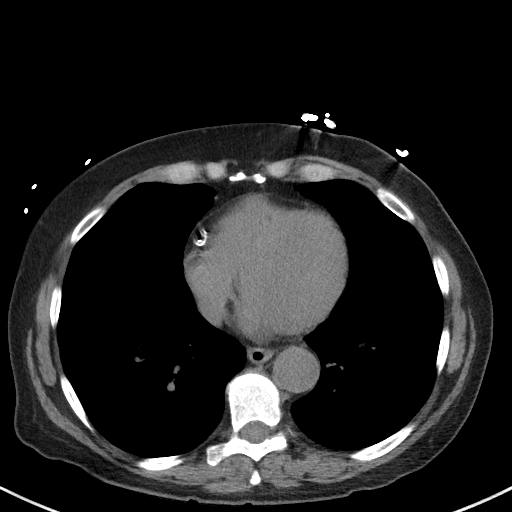

[Series 5: coronal st · coronal · 0.63mm/px · 3 of 73 slices shown]
[im 25/73  soft-tissue]
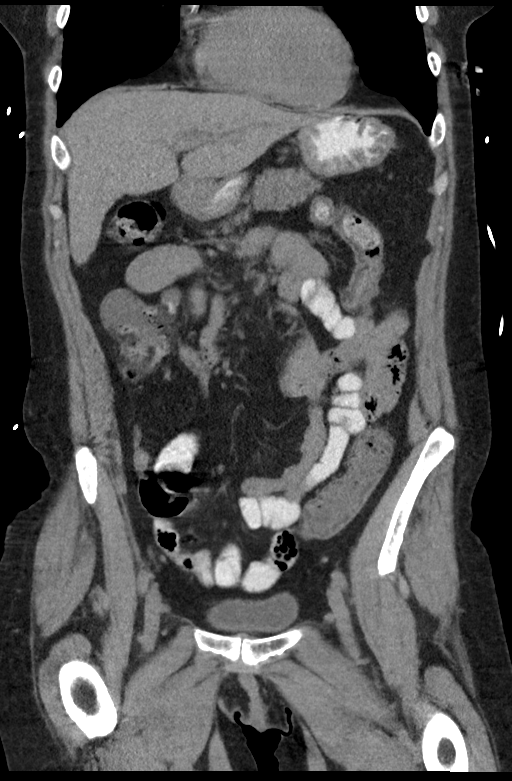
[im 33/73  soft-tissue]
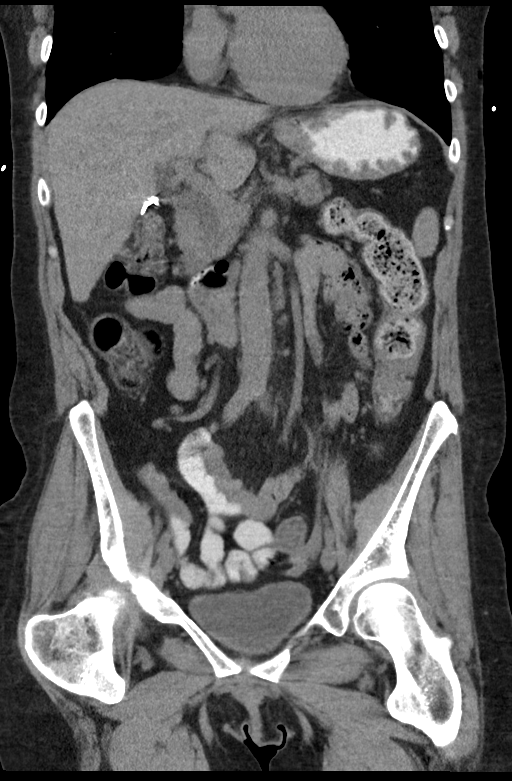
[im 41/73  soft-tissue]
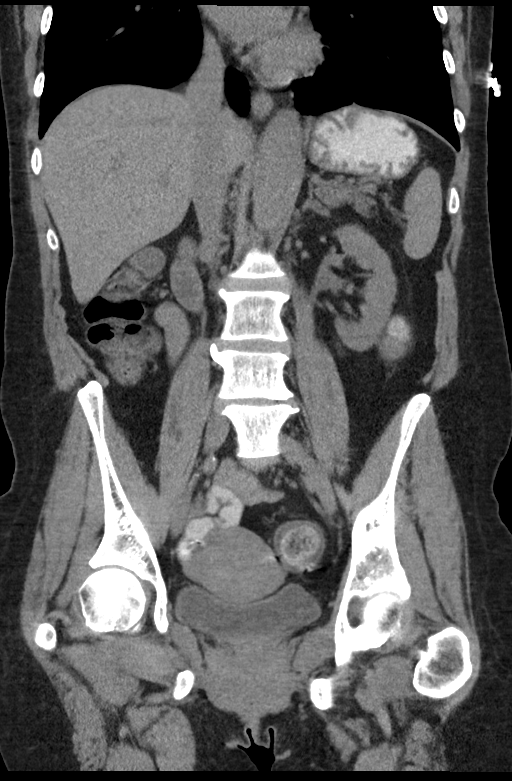

[15 of 46 positions shown; findings below may reference images not displayed]

FINDINGS: Lower chest: Lung bases are normal. Calcified plaque over the right
coronary artery.

Hepatobiliary: Previous cholecystectomy. The liver and biliary tree
are otherwise normal.

Pancreas: Normal.

Spleen: Normal.

Adrenals/Urinary Tract: Adrenal glands are normal. Kidneys normal in
size with mild bilateral nephrolithiasis. Slight decrease in size of
the right kidney. No hydronephrosis. Ureters and bladder are normal.

Stomach/Bowel: Stomach and small bowel are normal. Appendix is
normal. There are patchy areas of mild wall thickening involving the
colon most prominent over the distal descending/sigmoid colon which
may be due to an acute colitis of infectious or inflammatory nature.
There is no significant adjacent free fluid or inflammatory change.

Vascular/Lymphatic: Minimal calcified plaque over the abdominal
aorta. No significant adenopathy.

Reproductive: Within normal.  Evidence of previous tubal ligation.

Other: None.

Musculoskeletal: Minimal degenerate change of the spine.
IMPRESSION: Patchy mild wall thickening of the colon worse over the distal
descending/sigmoid colon which may be seen with an acute colitis of
infectious or inflammatory nature.

Mild bilateral nephrolithiasis.  No ureteral stones or obstruction.

Aortic Atherosclerosis ([id number]).

## 2019-06-24 ENCOUNTER — Other Ambulatory Visit: Payer: Self-pay

## 2019-06-24 ENCOUNTER — Encounter: Payer: Self-pay | Admitting: Family Medicine

## 2019-06-24 ENCOUNTER — Ambulatory Visit: Payer: Medicaid Other | Admitting: Family Medicine

## 2019-06-24 VITALS — BP 167/93 | HR 54 | Temp 98.0°F

## 2019-06-24 DIAGNOSIS — J441 Chronic obstructive pulmonary disease with (acute) exacerbation: Secondary | ICD-10-CM

## 2019-06-24 DIAGNOSIS — I129 Hypertensive chronic kidney disease with stage 1 through stage 4 chronic kidney disease, or unspecified chronic kidney disease: Secondary | ICD-10-CM | POA: Diagnosis not present

## 2019-06-24 DIAGNOSIS — E782 Mixed hyperlipidemia: Secondary | ICD-10-CM | POA: Diagnosis not present

## 2019-06-24 DIAGNOSIS — N3281 Overactive bladder: Secondary | ICD-10-CM

## 2019-06-24 DIAGNOSIS — E039 Hypothyroidism, unspecified: Secondary | ICD-10-CM | POA: Diagnosis not present

## 2019-06-24 DIAGNOSIS — I2510 Atherosclerotic heart disease of native coronary artery without angina pectoris: Secondary | ICD-10-CM | POA: Diagnosis not present

## 2019-06-24 DIAGNOSIS — Z7689 Persons encountering health services in other specified circumstances: Secondary | ICD-10-CM | POA: Diagnosis not present

## 2019-06-24 DIAGNOSIS — R8281 Pyuria: Secondary | ICD-10-CM

## 2019-06-24 DIAGNOSIS — F332 Major depressive disorder, recurrent severe without psychotic features: Secondary | ICD-10-CM | POA: Diagnosis not present

## 2019-06-24 NOTE — Patient Instructions (Signed)
[facility] Cardiology  01/02/20 at 11:00am

## 2019-06-24 NOTE — Progress Notes (Signed)
BP (!) 167/93 (BP Location: Left Arm, Patient Position: Sitting, Cuff Size: Normal)   Pulse (!) 54   Temp 98 F (36.7 C) (Oral)   SpO2 97%    Subjective:    Patient ID: Natalie Taylor, female    DOB: 09-15-1960, 59 y.o.   MRN: [id number]  HPI: Natalie Taylor is a 59 y.o. female  Chief Complaint  Patient presents with  . Hypertension  . Depression  . Hypothyroidism   HYPERTENSION / HYPERLIPIDEMIA Satisfied with current treatment? no Duration of hypertension: chronic BP monitoring frequency: not checking BP medication side effects: no Past BP meds: lisinopril, metoprolol Duration of hyperlipidemia: chronic Cholesterol medication side effects: no Cholesterol supplements: none Past cholesterol medications: atorvastatin Medication compliance: excellent compliance Aspirin: no Recent stressors: yes Recurrent headaches: no Visual changes: no Palpitations: no Dyspnea: no Chest pain: no Lower extremity edema: no Dizzy/lightheaded: no  DEPRESSION- has been under a huge amount of personal stress.  Mood status: stable Satisfied with current treatment?: yes Symptom severity: moderate  Duration of current treatment : chronic Side effects: no Medication compliance: excellent compliance Psychotherapy/counseling: no  Previous psychiatric medications: fluoxetine, welbutrin Depressed mood: yes Anxious mood: yes Anhedonia: no Significant weight loss or gain: no Insomnia: no  Fatigue: yes Feelings of worthlessness or guilt: yes Impaired concentration/indecisiveness: no Suicidal ideations: no Hopelessness: no Crying spells: yes Depression screen [facility] 2/9 06/24/2019 01/04/2019 12/13/2018 03/04/2018 09/28/2017  Decreased Interest 1 1 3 3 3   Down, Depressed, Hopeless 0 2 3 3 2   PHQ - 2 Score 1 3 6 6 5   Altered sleeping 0 2 2 3 3   Tired, decreased energy 0 1 3 3 1   Change in appetite 0 0 2 3 1   Feeling bad or failure about yourself  0 1 3 - 1  Trouble concentrating 0 2 3 2 2     Moving slowly or fidgety/restless 0 3 3 2 3   Suicidal thoughts 0 0 3 2 1   PHQ-9 Score 1 12 25 21 17   Difficult doing work/chores - Very difficult Extremely dIfficult Very difficult -  Some recent data might be hidden   HYPOTHYROIDISM Thyroid control status:stable Satisfied with current treatment? yes Medication side effects: no Medication compliance: good compliance Recent dose adjustment:no Fatigue: yes Cold intolerance: no Heat intolerance: no Weight gain: no Weight loss: no Constipation: no Diarrhea/loose stools: no Palpitations: no Lower extremity edema: no Anxiety/depressed mood: yes  Relevant past medical, surgical, family and social history reviewed and updated as indicated. Interim medical history since our last visit reviewed. Allergies and medications reviewed and updated.  Review of Systems  Constitutional: Negative.   Respiratory: Negative.   Cardiovascular: Negative.   Gastrointestinal: Negative.   Musculoskeletal: Negative.   Neurological: Negative.   Psychiatric/Behavioral: Positive for dysphoric mood. Negative for agitation, behavioral problems, confusion, decreased concentration, hallucinations, self-injury, sleep disturbance and suicidal ideas. The patient is nervous/anxious. The patient is not hyperactive.     Per HPI unless specifically indicated above     Objective:    BP (!) 167/93 (BP Location: Left Arm, Patient Position: Sitting, Cuff Size: Normal)   Pulse (!) 54   Temp 98 F (36.7 C) (Oral)   SpO2 97%   Wt Readings from Last 3 Encounters:  01/04/19 148 lb (67.1 kg)  12/13/18 147 lb (66.7 kg)  05/06/18 144 lb 6.4 oz (65.5 kg)    Physical Exam Vitals and nursing note reviewed.  Constitutional:      General: She is not in acute  distress.    Appearance: Normal appearance. She is not ill-appearing, toxic-appearing or diaphoretic.  HENT:     Head: Normocephalic and atraumatic.     Right Ear: External ear normal.     Left Ear: External  ear normal.     Nose: Nose normal.     Mouth/Throat:     Mouth: Mucous membranes are moist.     Pharynx: Oropharynx is clear.  Eyes:     General: No scleral icterus.       Right eye: No discharge.        Left eye: No discharge.     Extraocular Movements: Extraocular movements intact.     Conjunctiva/sclera: Conjunctivae normal.     Pupils: Pupils are equal, round, and reactive to light.  Cardiovascular:     Rate and Rhythm: Normal rate and regular rhythm.     Pulses: Normal pulses.     Heart sounds: Normal heart sounds. No murmur. No friction rub. No gallop.   Pulmonary:     Effort: Pulmonary effort is normal. No respiratory distress.     Breath sounds: Normal breath sounds. No stridor. No wheezing, rhonchi or rales.  Chest:     Chest wall: No tenderness.  Musculoskeletal:        General: Normal range of motion.     Cervical back: Normal range of motion and neck supple.  Skin:    General: Skin is warm and dry.     Capillary Refill: Capillary refill takes less than 2 seconds.     Coloration: Skin is not jaundiced or pale.     Findings: No bruising, erythema, lesion or rash.  Neurological:     General: No focal deficit present.     Mental Status: She is alert and oriented to person, place, and time. Mental status is at baseline.  Psychiatric:        Mood and Affect: Mood normal.        Behavior: Behavior normal.        Thought Content: Thought content normal.        Judgment: Judgment normal.     Results for orders placed or performed in visit on 06/24/19  Microscopic Examination   URINE  Result Value Ref Range   WBC, UA 6-10 (A) 0 - 5 /hpf   RBC None seen 0 - 2 /hpf   Epithelial Cells (non renal) 0-10 0 - 10 /hpf   Bacteria, UA Many (A) None seen/Few  Urine Culture, Reflex   URINE  Result Value Ref Range   Urine Culture, Routine WILL FOLLOW   CBC with Differential/Platelet  Result Value Ref Range   WBC 8.6 3.4 - 10.8 x10E3/uL   RBC 4.15 3.77 - 5.28 x10E6/uL    Hemoglobin 12.7 11.1 - 15.9 g/dL   Hematocrit 66.0 63.0 - 46.6 %   MCV 94 79 - 97 fL   MCH 30.6 26.6 - 33.0 pg   MCHC 32.6 31.5 - 35.7 g/dL   RDW 16.0 10.9 - 32.3 %   Platelets 212 150 - 450 x10E3/uL   Neutrophils 64 Not Estab. %   Lymphs 23 Not Estab. %   Monocytes 8 Not Estab. %   Eos 4 Not Estab. %   Basos 1 Not Estab. %   Neutrophils Absolute 5.5 1.4 - 7.0 x10E3/uL   Lymphocytes Absolute 2.0 0.7 - 3.1 x10E3/uL   Monocytes Absolute 0.7 0.1 - 0.9 x10E3/uL   EOS (ABSOLUTE) 0.4 0.0 - 0.4 x10E3/uL   Basophils  Absolute 0.1 0.0 - 0.2 x10E3/uL   Immature Granulocytes 0 Not Estab. %   Immature Grans (Abs) 0.0 0.0 - 0.1 x10E3/uL  Comprehensive metabolic panel  Result Value Ref Range   Glucose 85 65 - 99 mg/dL   BUN 25 (H) 6 - 24 mg/dL   Creatinine, Ser 1.09 (H) 0.57 - 1.00 mg/dL   GFR calc non Af Amer 56 (L) >59 mL/min/1.73   GFR calc Af Amer 65 >59 mL/min/1.73   BUN/Creatinine Ratio 23 9 - 23   Sodium 140 134 - 144 mmol/L   Potassium 4.5 3.5 - 5.2 mmol/L   Chloride 108 (H) 96 - 106 mmol/L   CO2 23 20 - 29 mmol/L   Calcium 8.3 (L) 8.7 - 10.2 mg/dL   Total Protein 6.6 6.0 - 8.5 g/dL   Albumin 3.8 3.8 - 4.9 g/dL   Globulin, Total 2.8 1.5 - 4.5 g/dL   Albumin/Globulin Ratio 1.4 1.2 - 2.2   Bilirubin Total <0.2 0.0 - 1.2 mg/dL   Alkaline Phosphatase 133 (H) 39 - 117 IU/L   AST 11 0 - 40 IU/L   ALT 7 0 - 32 IU/L  Lipid Panel w/o Chol/HDL Ratio  Result Value Ref Range   Cholesterol, Total 142 100 - 199 mg/dL   Triglycerides 73 0 - 149 mg/dL   HDL 48 >39 mg/dL   VLDL Cholesterol Cal 14 5 - 40 mg/dL   LDL Chol Calc (NIH) 80 0 - 99 mg/dL  Microalbumin, Urine Waived  Result Value Ref Range   Microalb, Ur Waived 30 (H) 0 - 19 mg/L   Creatinine, Urine Waived 50 10 - 300 mg/dL   Microalb/Creat Ratio 30-300 (H) <30 mg/g  TSH  Result Value Ref Range   TSH 1.980 0.450 - 4.500 uIU/mL  UA/M w/rflx Culture, Routine   Specimen: Urine   URINE  Result Value Ref Range   Specific  Gravity, UA 1.020 1.005 - 1.030   pH, UA 7.0 5.0 - 7.5   Color, UA Yellow Yellow   Appearance Ur Hazy (A) Clear   Leukocytes,UA 3+ (A) Negative   Protein,UA 1+ (A) Negative/Trace   Glucose, UA Negative Negative   Ketones, UA Negative Negative   RBC, UA Negative Negative   Bilirubin, UA Negative Negative   Urobilinogen, Ur 0.2 0.2 - 1.0 mg/dL   Nitrite, UA Positive (A) Negative   Microscopic Examination See below:    Urinalysis Reflex Comment       Assessment & Plan:   Problem List Items Addressed This Visit      Cardiovascular and Mediastinum   Coronary artery disease - Primary    Under good control on current regimen. Continue current regimen. Continue to monitor. Call with any concerns. Refills given. Labs drawn today.       Relevant Medications   lisinopril (ZESTRIL) 30 MG tablet   Other Relevant Orders   CBC with Differential/Platelet (Completed)   Comprehensive metabolic panel (Completed)     Respiratory   COPD (chronic obstructive pulmonary disease) ([place])    Under good control on current regimen. Continue current regimen. Continue to monitor. Call with any concerns. Refills given. Labs drawn today       Relevant Orders   CBC with Differential/Platelet (Completed)   Comprehensive metabolic panel (Completed)     Genitourinary   Benign hypertensive renal disease    Not under good control. Will increase her lisinopril to 30mg  and recheck 1 month. Call with any concerns.  Relevant Orders   CBC with Differential/Platelet (Completed)   Comprehensive metabolic panel (Completed)   Microalbumin, Urine Waived (Completed)   Overactive bladder    Under good control on current regimen. Continue current regimen. Continue to monitor. Call with any concerns. Refills given. Labs drawn today       Relevant Orders   CBC with Differential/Platelet (Completed)   Comprehensive metabolic panel (Completed)   UA/M w/rflx Culture, Routine (Completed)     Other   HLD  (hyperlipidemia)    Under good control on current regimen. Continue current regimen. Continue to monitor. Call with any concerns. Refills given. Labs drawn today       Relevant Medications   lisinopril (ZESTRIL) 30 MG tablet   Other Relevant Orders   CBC with Differential/Platelet (Completed)   Comprehensive metabolic panel (Completed)   Lipid Panel w/o Chol/HDL Ratio (Completed)   Depression    Under good control on current regimen. Continue current regimen. Continue to monitor. Call with any concerns. Refills given. Labs drawn today       Relevant Orders   CBC with Differential/Platelet (Completed)   Comprehensive metabolic panel (Completed)    Other Visit Diagnoses    Hypothyroidism, unspecified type       Relevant Orders   TSH (Completed)       Follow up plan: Return in about 4 weeks (around 07/22/2019).

## 2019-06-25 LAB — COMPREHENSIVE METABOLIC PANEL
ALT: 7 IU/L (ref 0–32)
AST: 11 IU/L (ref 0–40)
Albumin/Globulin Ratio: 1.4 (ref 1.2–2.2)
Albumin: 3.8 g/dL (ref 3.8–4.9)
Alkaline Phosphatase: 133 IU/L — ABNORMAL HIGH (ref 39–117)
BUN/Creatinine Ratio: 23 (ref 9–23)
BUN: 25 mg/dL — ABNORMAL HIGH (ref 6–24)
Bilirubin Total: <0.2 mg/dL (ref 0.0–1.2)
CO2: 23 mmol/L (ref 20–29)
Calcium: 8.3 mg/dL — ABNORMAL LOW (ref 8.7–10.2)
Chloride: 108 mmol/L — ABNORMAL HIGH (ref 96–106)
Creatinine, Ser: 1.09 mg/dL — ABNORMAL HIGH (ref 0.57–1.00)
GFR calc Af Amer: 65 mL/min/{1.73_m2} (ref >59)
GFR calc non Af Amer: 56 mL/min/{1.73_m2} — ABNORMAL LOW (ref >59)
Globulin, Total: 2.8 g/dL (ref 1.5–4.5)
Glucose: 85 mg/dL (ref 65–99)
Potassium: 4.5 mmol/L (ref 3.5–5.2)
Sodium: 140 mmol/L (ref 134–144)
Total Protein: 6.6 g/dL (ref 6.0–8.5)

## 2019-06-25 LAB — CBC WITH DIFFERENTIAL/PLATELET
Basophils Absolute: 0.1 10*3/uL (ref 0.0–0.2)
Basos: 1 %
EOS (ABSOLUTE): 0.4 10*3/uL (ref 0.0–0.4)
Eos: 4 %
Hematocrit: 39 % (ref 34.0–46.6)
Hemoglobin: 12.7 g/dL (ref 11.1–15.9)
Immature Grans (Abs): 0 10*3/uL (ref 0.0–0.1)
Immature Granulocytes: 0 %
Lymphocytes Absolute: 2 10*3/uL (ref 0.7–3.1)
Lymphs: 23 %
MCH: 30.6 pg (ref 26.6–33.0)
MCHC: 32.6 g/dL (ref 31.5–35.7)
MCV: 94 fL (ref 79–97)
Monocytes Absolute: 0.7 10*3/uL (ref 0.1–0.9)
Monocytes: 8 %
Neutrophils Absolute: 5.5 10*3/uL (ref 1.4–7.0)
Neutrophils: 64 %
Platelets: 212 10*3/uL (ref 150–450)
RBC: 4.15 x10E6/uL (ref 3.77–5.28)
RDW: 14 % (ref 11.7–15.4)
WBC: 8.6 10*3/uL (ref 3.4–10.8)

## 2019-06-25 LAB — TSH: TSH: 1.98 u[IU]/mL (ref 0.450–4.500)

## 2019-06-25 LAB — LIPID PANEL W/O CHOL/HDL RATIO
Cholesterol, Total: 142 mg/dL (ref 100–199)
HDL: 48 mg/dL (ref >39)
LDL Chol Calc (NIH): 80 mg/dL (ref 0–99)
Triglycerides: 73 mg/dL (ref 0–149)
VLDL Cholesterol Cal: 14 mg/dL (ref 5–40)

## 2019-06-25 MED ORDER — VALACYCLOVIR HCL 1 G PO TABS: 1000.0000 mg | ORAL_TABLET | Freq: Every day | ORAL | 3 refills | Status: DC

## 2019-06-25 MED ORDER — LISINOPRIL 30 MG PO TABS: 30.0000 mg | ORAL_TABLET | Freq: Every day | ORAL | 3 refills | Status: DC

## 2019-06-25 NOTE — Assessment & Plan Note (Signed)
Under good control on current regimen. Continue current regimen. Continue to monitor. Call with any concerns. Refills given. Labs drawn today.   

## 2019-06-25 NOTE — Assessment & Plan Note (Signed)
Not under good control. Will increase her lisinopril to 30mg and recheck 1 month. Call with any concerns. 

## 2019-06-26 ENCOUNTER — Other Ambulatory Visit: Payer: Self-pay | Admitting: Gastroenterology

## 2019-06-26 ENCOUNTER — Other Ambulatory Visit: Payer: Self-pay | Admitting: Family Medicine

## 2019-06-26 ENCOUNTER — Encounter: Payer: Self-pay | Admitting: Family Medicine

## 2019-06-26 MED ORDER — NITROFURANTOIN MONOHYD MACRO 100 MG PO CAPS: 100.0000 mg | ORAL_CAPSULE | Freq: Two times a day (BID) | ORAL | 0 refills | Status: DC

## 2019-06-27 NOTE — Telephone Encounter (Signed)
Last office visit 05/06/2018 Chronic constipation

## 2019-06-28 LAB — UA/M W/RFLX CULTURE, ROUTINE
Bilirubin, UA: NEGATIVE
Glucose, UA: NEGATIVE
Ketones, UA: NEGATIVE
Nitrite, UA: POSITIVE — AB
RBC, UA: NEGATIVE
Specific Gravity, UA: 1.02 (ref 1.005–1.030)
Urobilinogen, Ur: 0.2 mg/dL (ref 0.2–1.0)
pH, UA: 7 (ref 5.0–7.5)

## 2019-06-28 LAB — MICROSCOPIC EXAMINATION: RBC, Urine: NONE SEEN /hpf (ref 0–2)

## 2019-06-28 LAB — MICROALBUMIN, URINE WAIVED
Creatinine, Urine Waived: 50 mg/dL (ref 10–300)
Microalb, Ur Waived: 30 mg/L — ABNORMAL HIGH (ref 0–19)

## 2019-06-28 LAB — URINE CULTURE, REFLEX

## 2019-06-30 ENCOUNTER — Other Ambulatory Visit: Payer: Self-pay | Admitting: Gastroenterology

## 2019-06-30 ENCOUNTER — Telehealth: Payer: Self-pay | Admitting: Family Medicine

## 2019-06-30 NOTE — Telephone Encounter (Signed)
Routing to provider  

## 2019-06-30 NOTE — Telephone Encounter (Signed)
Patient requesting linaclotide (LINZESS) 145 MCG CAPS capsule, stating she's complelty out and pharmacy called In request today. Patient informed please allow 48 to 72 hour turn around time. Patient states can PCP expedite request . Patient would like a follow up call when rx sent   [facility] - [place], [place] - [address] Phone:  [phone]  Fax:  [phone]

## 2019-07-01 NOTE — Telephone Encounter (Signed)
Patient notified of Dr. Henriette Combs message. Will call to schedule f/up with Dr. Allegra Lai.

## 2019-07-01 NOTE — Telephone Encounter (Signed)
-  Patient is calling back to speak to [place] regarding Dr. Henriette Combs message. Please advise CB---[phone]

## 2019-07-01 NOTE — Telephone Encounter (Signed)
Called and LVM asking for patient to please return my call.  

## 2019-07-01 NOTE — Telephone Encounter (Signed)
I don't write that medicine. I'll get her enough to get back into see the GI doctor.

## 2019-07-06 ENCOUNTER — Ambulatory Visit: Payer: Self-pay | Admitting: Pharmacist

## 2019-07-06 ENCOUNTER — Telehealth: Payer: Self-pay

## 2019-07-06 NOTE — Chronic Care Management (AMB) (Signed)
  Chronic Care Management   Note  07/06/2019 Name: KLOHE LOVERING MRN: [id number] DOB: [birth year]  LIESE DIZDAREVIC is a 59 y.o. year old female who is a primary care patient of Dorcas Carrow, DO. The CCM team was consulted for assistance with chronic disease management and care coordination needs.    Attempted to contact patient for medication management review. Left HIPAA compliant message for patient to return my call at their convenience.   Plan: - Will collaborate with Care Guide to outreach to schedule follow up with me  Catie Feliz Beam, PharmD, [facility] Clinical Pharmacist [facility] [phone]

## 2019-07-11 ENCOUNTER — Other Ambulatory Visit: Payer: Self-pay | Admitting: Gastroenterology

## 2019-07-11 ENCOUNTER — Other Ambulatory Visit: Payer: Self-pay | Admitting: Family Medicine

## 2019-07-11 NOTE — Telephone Encounter (Signed)
Requested Prescriptions  Pending Prescriptions Disp Refills  . buPROPion (WELLBUTRIN SR) 200 MG 12 hr tablet [Pharmacy Med Name: BUPROPION HCL ER (SR) 200 MG TAB] 180 tablet 0    Sig: TAKE 1 TABLET BY MOUTH ONCE DAILY FOR 1 WEEK THEN 1 TAB TWICE DAILY     Psychiatry: Antidepressants - bupropion Failed - 07/11/2019  9:22 AM      Failed - Last BP in normal range    BP Readings from Last 1 Encounters:  06/24/19 (!) 167/93         Passed - Completed PHQ-2 or PHQ-9 in the last 360 days.      Passed - Valid encounter within last 6 months    Recent Outpatient Visits          2 weeks ago Coronary artery disease involving native coronary artery of native heart without angina pectoris   [facility], Megan P, DO   6 months ago Benign hypertensive renal disease   [facility] [place], [place], DO   7 months ago Benign hypertensive renal disease   [facility] [place], [place], DO   1 year ago Chronic constipation   [facility] [place], [place], DO   1 year ago Acute colitis   [facility] Dorcas Carrow, DO      Future Appointments            In 2 weeks Laural Benes, Oralia Rud, DO [facility]

## 2019-07-11 NOTE — Telephone Encounter (Signed)
Last office visit 05/06/2018 Chronic constipation  Last refill 03/04/18 3 refills  No appointment is scheduled

## 2019-07-11 NOTE — Telephone Encounter (Signed)
The pharmacist called & left a message stating he had sent two request for   linaclotide Natalie Taylor) 145 MCG CAPS capsule   Call in to [facility] [phone].

## 2019-07-12 ENCOUNTER — Ambulatory Visit: Payer: Self-pay | Admitting: Pharmacist

## 2019-07-12 DIAGNOSIS — I129 Hypertensive chronic kidney disease with stage 1 through stage 4 chronic kidney disease, or unspecified chronic kidney disease: Secondary | ICD-10-CM

## 2019-07-12 NOTE — Chronic Care Management (AMB) (Signed)
Chronic Care Management   Follow Up Note   07/12/2019 Name: Natalie Taylor MRN: [id number] DOB: 24-Sep-1960  Referred by: Dorcas Carrow, DO Reason for referral : Chronic Care Management (Medication Management)   Natalie Taylor is a 59 y.o. year old female who is a primary care patient of Dorcas Carrow, DO. The CCM team was consulted for assistance with chronic disease management and care coordination needs.    Care coordination completed today.   Review of patient status, including review of consultants reports, relevant laboratory and other test results, and collaboration with appropriate care team members and the patient's provider was performed as part of comprehensive patient evaluation and provision of chronic care management services.    SDOH (Social Determinants of Health) assessments performed: No See Care Plan activities for detailed interventions related to [facility])     Outpatient Encounter Medications as of 07/12/2019  Medication Sig Note  . albuterol (VENTOLIN HFA) 108 (90 Base) MCG/ACT inhaler Inhale 2 puffs into the lungs 2 (two) times daily.   Marland Kitchen atorvastatin (LIPITOR) 80 MG tablet Take 1 tablet (80 mg total) by mouth daily at 6 PM.   . BRILINTA 90 MG TABS tablet Take 1 tablet (90 mg total) by mouth 2 (two) times daily.   . budesonide-formoterol (SYMBICORT) 160-4.5 MCG/ACT inhaler Inhale 2 puffs into the lungs 2 (two) times daily. 04/12/2019: Taking 2 puffs daily  . buPROPion (WELLBUTRIN SR) 200 MG 12 hr tablet TAKE 1 TABLET BY MOUTH ONCE DAILY FOR 1 WEEK THEN 1 TAB TWICE DAILY   . FLUoxetine (PROZAC) 20 MG tablet TAKE 3 TABLETS (60 MG) BY MOUTH ONCE DAILY   . levothyroxine (SYNTHROID) 25 MCG tablet TAKE 1 TABLET BY MOUTH ONCE DAILY ON AN EMPTY STOMACH AT 6AM. WAIT 30 MINUTES BEFORE TAKING OTHER MEDS.   Marland Kitchen linaclotide (LINZESS) 145 MCG CAPS capsule Take 1 capsule (145 mcg total) by mouth daily before breakfast.   . lisinopril (ZESTRIL) 30 MG tablet Take 1 tablet (30 mg  total) by mouth daily.   . metoprolol tartrate (LOPRESSOR) 50 MG tablet Take 0.5 tablets (25 mg total) by mouth 2 (two) times daily.   . nitrofurantoin, macrocrystal-monohydrate, (MACROBID) 100 MG capsule Take 1 capsule (100 mg total) by mouth 2 (two) times daily.   Marland Kitchen omeprazole (PRILOSEC) 20 MG capsule Take 1 capsule (20 mg total) by mouth 2 (two) times daily before a meal.   . ondansetron (ZOFRAN-ODT) 4 MG disintegrating tablet Take 1 tablet (4 mg total) by mouth every 8 (eight) hours as needed for nausea or vomiting. 06/03/2019: Taking QAM  . traZODone (DESYREL) 50 MG tablet Take 1-2 tablets (50-100 mg total) by mouth at bedtime as needed for sleep. 12/22/2018: Taking 2 tablets most evening  . triamcinolone ointment (KENALOG) 0.5 % Apply 1 application topically 2 (two) times daily.   . valACYclovir (VALTREX) 1000 MG tablet Take 1 tablet (1,000 mg total) by mouth daily.    No facility-administered encounter medications on file as of 07/12/2019.     Objective:   Goals Addressed            This Visit's Progress     Patient Stated   . "I have a lot of medications" (pt-stated)       CARE PLAN ENTRY (see longtitudinal plan of care for additional care plan information)  Current Barriers:  . Polypharmacy; complex patient with multiple comorbidities including tobacco abuse, CAD hx NSTEMI, COPD, depression . Self-manages medications, interested in pursuing pill packaging  by [facility] o CAD/hx NSTEMI: taking atorvastatin 80 mg daily; metoprolol tartrate 25 mg BID, lisinopril 30 mg daily; Brilinta 90 mg BID  o Depression: trazodone 100 mg most evenings; bupropion 200 mg BID; fluoxetine 60 mg daily  o COPD: Symbicort 160/4.5 mg BID, albuterol HFA PRN o Hypothyroidism: levothyroxine 25 mcg daily o Constipation, nausea: Linzess 145 mcg daily per Dr. Marius Ditch  Pharmacist Clinical Goal(s):  Marland Kitchen Over the next 90 days, patient will work with PharmD and provider towards optimized medication  management  Interventions: . Faxing updated medication list to [facility] and asked to start pill packing for patient. Contacted patient, left voicemail for her noting that I was sending her medication list and for her to talk to the pharmacy about initiating adherence packaging.   Patient Self Care Activities:  . Patient will take medications as prescribed  Please see past updates related to this goal by clicking on the "Past Updates" button in the selected goal          Plan:  - Scheduled f/u call 09/07/19  Catie Darnelle Maffucci, PharmD, [place] [phone]

## 2019-07-12 NOTE — Patient Instructions (Signed)
Visit Information  Goals Addressed            This Visit's Progress     Patient Stated   . "I have a lot of medications" (pt-stated)       CARE PLAN ENTRY (see longtitudinal plan of care for additional care plan information)  Current Barriers:  . Polypharmacy; complex patient with multiple comorbidities including tobacco abuse, CAD hx NSTEMI, COPD, depression . Self-manages medications, interested in pursuing pill packaging by [facility] o CAD/hx NSTEMI: taking atorvastatin 80 mg daily; metoprolol tartrate 25 mg BID, lisinopril 30 mg daily; Brilinta 90 mg BID  o Depression: trazodone 100 mg most evenings; bupropion 200 mg BID; fluoxetine 60 mg daily  o COPD: Symbicort 160/4.5 mg BID, albuterol HFA PRN o Hypothyroidism: levothyroxine 25 mcg daily o Constipation, nausea: Linzess 145 mcg daily per Dr. Allegra Lai  Pharmacist Clinical Goal(s):  Marland Kitchen Over the next 90 days, patient will work with PharmD and provider towards optimized medication management  Interventions: . Faxing updated medication list to [facility] and asked to start pill packing for patient. Contacted patient, left voicemail for her noting that I was sending her medication list and for her to talk to the pharmacy about initiating adherence packaging.   Patient Self Care Activities:  . Patient will take medications as prescribed  Please see past updates related to this goal by clicking on the "Past Updates" button in the selected goal         Patient verbalizes understanding of instructions provided today.   Plan:  - Scheduled f/u call 09/07/19  Catie Feliz Beam, PharmD, [facility] Clinical Pharmacist [facility] [phone]

## 2019-07-26 ENCOUNTER — Ambulatory Visit: Payer: Medicaid Other | Admitting: Family Medicine

## 2019-08-09 ENCOUNTER — Ambulatory Visit: Payer: Self-pay | Admitting: Pharmacist

## 2019-08-09 ENCOUNTER — Telehealth: Payer: Self-pay | Admitting: Family Medicine

## 2019-08-09 NOTE — Chronic Care Management (AMB) (Signed)
  Chronic Care Management   Note  08/09/2019 Name: Natalie Taylor MRN: [id number] DOB: 02/14/1961  Natalie Taylor is a 59 y.o. year old female who is a primary care patient of Dorcas Carrow, DO. The CCM team was consulted for assistance with chronic disease management and care coordination needs.    Received message from pharmacist at [facility] that she was having a difficult time getting in touch with the patient to coordinate pill packing.   Called patient, spoke with her. She noted that she had voicemails from [facility], but that she thought they were regarding a problem that had already been solved. I asked her to call [facility] and speak with Dorathy Daft to discuss. She noted she would do so today.   Catie Feliz Beam, PharmD, [facility] Clinical Pharmacist [facility] [phone]

## 2019-08-09 NOTE — Telephone Encounter (Signed)
Spoke with Dorathy Daft and she stated that the pt picked up a 90 day supply of vatrex on 03/07 and lisinopril on 04/09 with a 30 day supply. She stated that she tried to contact the pt to bring in the medications so that she could complete the pill pack but she has not been able to get in contact with her. Dorathy Daft would like a call back with any suggestions.

## 2019-08-09 NOTE — Telephone Encounter (Signed)
See CCM documentation 

## 2019-08-09 NOTE — Telephone Encounter (Signed)
Copied from CRM [id number]. Topic: General - Other >> Aug 09, 2019  2:18 PM Tamela Oddi wrote: Reason for CRM: Called to speak with [facility] regarding some medication for the patient.  CB# [phone]

## 2019-09-04 ENCOUNTER — Emergency Department: Payer: Medicaid Other

## 2019-09-04 ENCOUNTER — Emergency Department
Admission: EM | Admit: 2019-09-04 | Discharge: 2019-09-05 | Disposition: A | Discharge status: Home / Self Care | Payer: Medicaid Other | Attending: Emergency Medicine | Admitting: Emergency Medicine

## 2019-09-04 ENCOUNTER — Other Ambulatory Visit: Payer: Self-pay

## 2019-09-04 DIAGNOSIS — Y9389 Activity, other specified: Secondary | ICD-10-CM | POA: Diagnosis not present

## 2019-09-04 DIAGNOSIS — Y929 Unspecified place or not applicable: Secondary | ICD-10-CM | POA: Insufficient documentation

## 2019-09-04 DIAGNOSIS — L03116 Cellulitis of left lower limb: Secondary | ICD-10-CM | POA: Diagnosis not present

## 2019-09-04 DIAGNOSIS — W0110XA Fall on same level from slipping, tripping and stumbling with subsequent striking against unspecified object, initial encounter: Secondary | ICD-10-CM | POA: Insufficient documentation

## 2019-09-04 DIAGNOSIS — J449 Chronic obstructive pulmonary disease, unspecified: Secondary | ICD-10-CM | POA: Diagnosis not present

## 2019-09-04 DIAGNOSIS — I10 Essential (primary) hypertension: Secondary | ICD-10-CM | POA: Diagnosis not present

## 2019-09-04 DIAGNOSIS — W19XXXA Unspecified fall, initial encounter: Secondary | ICD-10-CM

## 2019-09-04 DIAGNOSIS — Z79899 Other long term (current) drug therapy: Secondary | ICD-10-CM | POA: Insufficient documentation

## 2019-09-04 DIAGNOSIS — R600 Localized edema: Secondary | ICD-10-CM | POA: Diagnosis not present

## 2019-09-04 DIAGNOSIS — Z955 Presence of coronary angioplasty implant and graft: Secondary | ICD-10-CM | POA: Insufficient documentation

## 2019-09-04 DIAGNOSIS — F17210 Nicotine dependence, cigarettes, uncomplicated: Secondary | ICD-10-CM | POA: Diagnosis not present

## 2019-09-04 DIAGNOSIS — I252 Old myocardial infarction: Secondary | ICD-10-CM | POA: Insufficient documentation

## 2019-09-04 DIAGNOSIS — Y999 Unspecified external cause status: Secondary | ICD-10-CM | POA: Insufficient documentation

## 2019-09-04 DIAGNOSIS — Y92009 Unspecified place in unspecified non-institutional (private) residence as the place of occurrence of the external cause: Secondary | ICD-10-CM

## 2019-09-04 DIAGNOSIS — S8992XA Unspecified injury of left lower leg, initial encounter: Secondary | ICD-10-CM | POA: Diagnosis not present

## 2019-09-04 DIAGNOSIS — S8012XA Contusion of left lower leg, initial encounter: Secondary | ICD-10-CM | POA: Diagnosis not present

## 2019-09-04 MED ORDER — CYCLOBENZAPRINE HCL 5 MG PO TABS: 5.0000 mg | ORAL_TABLET | Freq: Three times a day (TID) | ORAL | 0 refills | Status: DC | PRN

## 2019-09-04 MED ORDER — CEPHALEXIN 500 MG PO CAPS: 500.0000 mg | ORAL_CAPSULE | Freq: Three times a day (TID) | ORAL | 0 refills | Status: AC

## 2019-09-04 MED ORDER — CEPHALEXIN 500 MG PO CAPS
500.0000 mg | ORAL_CAPSULE | Freq: Once | ORAL | Status: AC
  Administered 2019-09-05: 500 mg via ORAL
  Filled 2019-09-04: qty 1

## 2019-09-04 MED ORDER — HYDROCODONE-ACETAMINOPHEN 5-325 MG PO TABS
1.0000 | ORAL_TABLET | Freq: Once | ORAL | Status: AC
  Administered 2019-09-04: 1 via ORAL
  Filled 2019-09-04: qty 1

## 2019-09-04 NOTE — ED Provider Notes (Signed)
[facility] Emergency Department Provider Note ____________________________________________  Time seen: 2245  I have reviewed the triage vital signs and the nursing notes.  HISTORY  Chief Complaint  Fall  HPI Natalie Taylor is a 59 y.o. female presents herself to the ED 2 weeks following a mechanical fall.  Patient describes a trip and fall at the she and husband were attempting to repair some plumbing pipe.  Patient did not seek care following a mechanical fall, and has had left lower extremity swelling since that time.  She does admit to several superficial abrasions, and presents now with some superficial erythema and redness to the anterior leg.  She also describes some bruising to the posterior and distal calf and ankle.  She denies any interim fever, chills, or sweats.  The patient has a history of hypertension, COPD, and coronary artery disease.  She does not take blood thinners.   Past Medical History:  Diagnosis Date  . Anxiety   . Arthritis    hands  . COPD (chronic obstructive pulmonary disease) (HCC)   . Coronary artery disease   . Depression   . GERD (gastroesophageal reflux disease)   . Headache    daily  . Hyperlipidemia   . Hypertension   . MI (myocardial infarction) (HCC)    x 2  . Shortness of breath dyspnea    pt attributes to meds  . Sleep apnea     Patient Active Problem List   Diagnosis Date Noted  . Coronary artery disease 05/06/2018  . Chronic constipation 03/04/2018  . Malnutrition of moderate degree 02/19/2018  . GERD (gastroesophageal reflux disease) 12/06/2016  . Overactive bladder 09/24/2016  . COPD (chronic obstructive pulmonary disease) (HCC) 09/24/2016  . 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 HLD (hyperlipidemia) 11/01/2015  . Depression 11/01/2015  . History of ST elevation myocardial infarction (STEMI) 09/27/2015    Past Surgical History:  Procedure Laterality Date  . CARDIAC CATHETERIZATION N/A  09/27/2015   Procedure: Left Heart Cath and Coronary Angiography;  Surgeon: Alwyn Pea, MD;  Location: [facility] INVASIVE CV LAB;  Service: Cardiovascular;  Laterality: N/A;  . CARDIAC CATHETERIZATION N/A 09/27/2015   Procedure: Coronary Stent Intervention;  Surgeon: Alwyn Pea, MD;  Location: [facility] INVASIVE CV LAB;  Service: Cardiovascular;  Laterality: N/A;  . Cardiac stents  09/27/2015  . CHOLECYSTECTOMY    . COLONOSCOPY WITH PROPOFOL N/A 11/19/2015   Procedure: COLONOSCOPY WITH PROPOFOL;  Surgeon: Midge Minium, MD;  Location: [facility];  Service: Endoscopy;  Laterality: N/A;  . INDUCED ABORTION    . MULTIPLE TOOTH EXTRACTIONS    . TUBAL LIGATION      Prior to Admission medications   Medication Sig Start Date End Date Taking? Authorizing Provider  albuterol (VENTOLIN HFA) 108 (90 Base) MCG/ACT inhaler Inhale 2 puffs into the lungs 2 (two) times daily. 04/13/19   Johnson, Megan P, DO  atorvastatin (LIPITOR) 80 MG tablet Take 1 tablet (80 mg total) by mouth daily at 6 PM. 12/13/18   Johnson, Megan P, DO  BRILINTA 90 MG TABS tablet Take 1 tablet (90 mg total) by mouth 2 (two) times daily. 08/24/18   Johnson, Megan P, DO  budesonide-formoterol (SYMBICORT) 160-4.5 MCG/ACT inhaler Inhale 2 puffs into the lungs 2 (two) times daily. 12/13/18   Johnson, Megan P, DO  buPROPion (WELLBUTRIN SR) 200 MG 12 hr tablet TAKE 1 TABLET BY MOUTH ONCE DAILY FOR 1 WEEK THEN 1 TAB TWICE DAILY 07/11/19   Laural Benes,  Megan P, DO  cephALEXin (KEFLEX) 500 MG capsule Take 1 capsule (500 mg total) by mouth 3 (three) times daily for 7 days. 09/04/19 09/11/19  Abegail Kloeppel, Dannielle Karvonen, PA-C  cyclobenzaprine (FLEXERIL) 5 MG tablet Take 1 tablet (5 mg total) by mouth 3 (three) times daily as needed. 09/04/19   Timohty Renbarger, Dannielle Karvonen, PA-C  FLUoxetine (PROZAC) 20 MG tablet TAKE 3 TABLETS (60 MG) BY MOUTH ONCE DAILY 07/11/19   Wynetta Emery, Megan P, DO  ibuprofen (ADVIL) 800 MG tablet Take 1 tablet (800 mg total) by mouth every 8  (eight) hours as needed. 09/05/19   Vibhav Waddill, Dannielle Karvonen, PA-C  levothyroxine (SYNTHROID) 25 MCG tablet TAKE 1 TABLET BY MOUTH ONCE DAILY ON AN EMPTY STOMACH AT 6AM. WAIT 30 MINUTES BEFORE TAKING OTHER MEDS. 05/31/19   Valerie Roys, DO  linaclotide (LINZESS) 145 MCG CAPS capsule Take 1 capsule (145 mcg total) by mouth daily before breakfast. 07/11/19   Vanga, Tally Due, MD  lisinopril (ZESTRIL) 30 MG tablet Take 1 tablet (30 mg total) by mouth daily. 06/25/19   Johnson, Megan P, DO  metoprolol tartrate (LOPRESSOR) 50 MG tablet Take 0.5 tablets (25 mg total) by mouth 2 (two) times daily. 12/13/18   Johnson, Megan P, DO  nitrofurantoin, macrocrystal-monohydrate, (MACROBID) 100 MG capsule Take 1 capsule (100 mg total) by mouth 2 (two) times daily. 06/26/19   Johnson, Megan P, DO  omeprazole (PRILOSEC) 20 MG capsule Take 1 capsule (20 mg total) by mouth 2 (two) times daily before a meal. 12/13/18   Johnson, Megan P, DO  ondansetron (ZOFRAN-ODT) 4 MG disintegrating tablet Take 1 tablet (4 mg total) by mouth every 8 (eight) hours as needed for nausea or vomiting. 04/12/19   Wynetta Emery, Megan P, DO  traZODone (DESYREL) 50 MG tablet Take 1-2 tablets (50-100 mg total) by mouth at bedtime as needed for sleep. 12/13/18   Johnson, Megan P, DO  triamcinolone ointment (KENALOG) 0.5 % Apply 1 application topically 2 (two) times daily. 04/29/18   Johnson, Megan P, DO  valACYclovir (VALTREX) 1000 MG tablet Take 1 tablet (1,000 mg total) by mouth daily. 06/25/19   Park Liter P, DO    Allergies Cranberry and Tape  Family History  Problem Relation Age of Onset  . Alcohol abuse Mother   . Arthritis Mother   . Asthma Mother   . Cancer Mother   . Hypertension Mother   . Migraines Mother   . Hyperlipidemia Sister   . Hyperlipidemia Brother   . Heart disease Maternal Grandmother   . Emphysema Maternal Grandfather   . Breast cancer Paternal Aunt 73       double mastectomy    Social History Social History    Tobacco Use  . Smoking status: Current Every Day Smoker    Packs/day: 0.50    Years: 40.00    Pack years: 20.00    Types: Cigarettes  . Smokeless tobacco: Never Used  Substance Use Topics  . Alcohol use: No  . Drug use: No    Review of Systems  Constitutional: Negative for fever. Cardiovascular: Negative for chest pain. Respiratory: Negative for shortness of breath. Musculoskeletal: Negative for back pain.  Left leg pain and swelling as above. Skin: Negative for rash. Neurological: Negative for headaches, focal weakness or numbness. ____________________________________________  PHYSICAL EXAM:  VITAL SIGNS: ED Triage Vitals  Enc Vitals Group     BP 09/04/19 2124 (!) 120/57     Pulse Rate 09/04/19 2124 (!) 58  Resp 09/04/19 2124 16     Temp 09/04/19 2124 98.2 F (36.8 C)     Temp src --      SpO2 09/04/19 2124 98 %     Weight 09/04/19 2125 140 lb (63.5 kg)     Height 09/04/19 2125 5\' 6"  (1.676 m)     Head Circumference --      Peak Flow --      Pain Score 09/04/19 2124 10     Pain Loc --      Pain Edu? --      Excl. in GC? --     Constitutional: Alert and oriented. Well appearing and in no distress. Head: Normocephalic and atraumatic. Eyes: Conjunctivae are normal. Normal extraocular movements Cardiovascular: Normal rate, regular rhythm. Normal distal pulses and cap refill.2125 Respiratory: Normal respiratory effort. No wheezes/rales/rhonchi. Musculoskeletal: Left lower extremity with soft tissue swelling to the lower leg from the knee to the ankle.  Patient is also noted to have ecchymosis and bruising to the posterior calf.  No palpable cords or muscle deformity is appreciated.  Normal Thompson test is noted.  Ankle and knee exam are benign.  The anterior left leg does reveal some superficial erythema over the anterior portion.  Nontender with normal range of motion in all extremities.  Neurologic: Cranial nerves II through XII grossly intact.  Antalgic gait  without ataxia. Normal speech and language. No gross focal neurologic deficits are appreciated. Skin:  Skin is warm, dry and intact. No rash noted.  No cyanosis, clubbing, or wound dehiscence is noted. ____________________________________________   RADIOLOGY  DG Left Tibia/Fibula   Negative  LLE Venous Doppler /US  Negative ____________________________________________  PROCEDURES  Norco 5-325 mg PO Keflex 500 mg PO  Procedures ____________________________________________  INITIAL IMPRESSION / ASSESSMENT AND PLAN / ED COURSE  DDX: cellulitis, tib/fib, fracture, phlebitis/thrombophlebitis  Patient with ED evaluation of left lower extremity swelling and bruising final mechanical fall.  X-rays negative for any acute fracture or dislocation.  Patient clinical but otherwise is concerning for possible soft tissue injury with a overlying nonpurulent cellulitis.  Ultrasound imaging is negative for any vasocclusive findings. Patient will be evaluated for possible thrombophlebitis of phlebitis as well as DVT to the posterior chain.  She be treated with Keflex for probable erysipelas.  Return precautions will be reviewed.  LUCYANN ROMANO was evaluated in Emergency Department on 09/05/2019 for the symptoms described in the history of present illness. She was evaluated in the context of the global COVID-19 pandemic, which necessitated consideration that the patient might be at risk for infection with the SARS-CoV-2 virus that causes COVID-19. Institutional protocols and algorithms that pertain to the evaluation of patients at risk for COVID-19 are in a state of rapid change based on information released by regulatory bodies including the CDC and federal and state organizations. These policies and algorithms were followed during the patient's care in the ED. ____________________________________________  FINAL CLINICAL IMPRESSION(S) / ED DIAGNOSES  Final diagnoses:  Fall in home, initial encounter   Cellulitis of left lower extremity  Edema leg      Longino Trefz, 09/07/2019, PA-C 09/05/19 0004    09/07/19, MD 09/08/19 [phone]

## 2019-09-04 NOTE — ED Triage Notes (Signed)
Patient reports mecahical fall 2 weeks ago. Patient c/o left lower leg pain. Bruising noted to anterior and posterior lower leg.

## 2019-09-04 NOTE — Discharge Instructions (Addendum)
Your exam is normal and reassuring at this time.  No radiologic evidence of any acute fracture to the lower leg, and no ultrasound evidence of a blood clot or phlebitis.  You are advised to rest with the leg elevated and apply ice to reduce swelling and bruising.  Take the prescription muscle relaxant and anti-inflammatory as well as the antibiotic as directed.  Follow-up with your primary provider for wound check next week.  Return to the ED as needed.

## 2019-09-05 ENCOUNTER — Other Ambulatory Visit: Payer: Self-pay | Admitting: Family Medicine

## 2019-09-05 DIAGNOSIS — S8012XA Contusion of left lower leg, initial encounter: Secondary | ICD-10-CM | POA: Diagnosis not present

## 2019-09-05 MED ORDER — IBUPROFEN 800 MG PO TABS: 800.0000 mg | ORAL_TABLET | Freq: Three times a day (TID) | ORAL | 0 refills | Status: AC | PRN

## 2019-09-07 ENCOUNTER — Telehealth: Payer: Self-pay

## 2019-09-09 ENCOUNTER — Ambulatory Visit: Payer: Self-pay | Admitting: Pharmacist

## 2019-09-09 DIAGNOSIS — I252 Old myocardial infarction: Secondary | ICD-10-CM

## 2019-09-09 DIAGNOSIS — F332 Major depressive disorder, recurrent severe without psychotic features: Secondary | ICD-10-CM

## 2019-09-09 DIAGNOSIS — I129 Hypertensive chronic kidney disease with stage 1 through stage 4 chronic kidney disease, or unspecified chronic kidney disease: Secondary | ICD-10-CM

## 2019-09-09 NOTE — Chronic Care Management (AMB) (Signed)
Chronic Care Management   Follow Up Note   09/09/2019 Name: Natalie Taylor MRN: [id number] DOB: Jul 07, 1960  Referred by: Valerie Roys, DO Reason for referral : Chronic Care Management (Medication Management)   Natalie Taylor is a 59 y.o. year old female who is a primary care patient of Valerie Roys, DO. The CCM team was consulted for assistance with chronic disease management and care coordination needs.    Contacted patient for medication management review.   Review of patient status, including review of consultants reports, relevant laboratory and other test results, and collaboration with appropriate care team members and the patient's provider was performed as part of comprehensive patient evaluation and provision of chronic care management services.    SDOH (Social Determinants of Health) assessments performed: No See Care Plan activities for detailed interventions related to [facility])     Outpatient Encounter Medications as of 09/09/2019  Medication Sig Note  . atorvastatin (LIPITOR) 80 MG tablet Take 1 tablet (80 mg total) by mouth daily at 6 PM.   . BRILINTA 90 MG TABS tablet Take 1 tablet (90 mg total) by mouth 2 (two) times daily.   Marland Kitchen buPROPion (WELLBUTRIN SR) 200 MG 12 hr tablet TAKE 1 TABLET BY MOUTH ONCE DAILY FOR 1 WEEK THEN 1 TAB TWICE DAILY   . cephALEXin (KEFLEX) 500 MG capsule Take 1 capsule (500 mg total) by mouth 3 (three) times daily for 7 days.   Marland Kitchen FLUoxetine (PROZAC) 20 MG tablet TAKE 3 TABLETS (60 MG) BY MOUTH ONCE DAILY   . lisinopril (ZESTRIL) 30 MG tablet Take 1 tablet (30 mg total) by mouth daily.   . metoprolol tartrate (LOPRESSOR) 50 MG tablet Take 0.5 tablets (25 mg total) by mouth 2 (two) times daily.   Marland Kitchen omeprazole (PRILOSEC) 20 MG capsule Take 1 capsule (20 mg total) by mouth 2 (two) times daily before a meal.   . traZODone (DESYREL) 50 MG tablet TAKE 1 OR 2 TABLETS BY MOUTH AT BEDTIME AS NEEDED FOR SLEEP   . valACYclovir (VALTREX) 1000 MG tablet  Take 1 tablet (1,000 mg total) by mouth daily.   Marland Kitchen albuterol (VENTOLIN HFA) 108 (90 Base) MCG/ACT inhaler Inhale 2 puffs into the lungs 2 (two) times daily.   . budesonide-formoterol (SYMBICORT) 160-4.5 MCG/ACT inhaler Inhale 2 puffs into the lungs 2 (two) times daily. 04/12/2019: Taking 2 puffs daily  . cyclobenzaprine (FLEXERIL) 5 MG tablet Take 1 tablet (5 mg total) by mouth 3 (three) times daily as needed.   Marland Kitchen ibuprofen (ADVIL) 800 MG tablet Take 1 tablet (800 mg total) by mouth every 8 (eight) hours as needed.   Marland Kitchen levothyroxine (SYNTHROID) 25 MCG tablet TAKE 1 TABLET BY MOUTH ONCE DAILY ON AN EMPTY STOMACH AT 6AM. WAIT 30 MINUTES BEFORE TAKING OTHER MEDS. (Patient not taking: Reported on 09/09/2019)   . linaclotide (LINZESS) 145 MCG CAPS capsule Take 1 capsule (145 mcg total) by mouth daily before breakfast. (Patient not taking: Reported on 09/09/2019) 09/09/2019: Need to f/u with GI for refills  . ondansetron (ZOFRAN-ODT) 4 MG disintegrating tablet Take 1 tablet (4 mg total) by mouth every 8 (eight) hours as needed for nausea or vomiting. 06/03/2019: Taking QAM  . triamcinolone ointment (KENALOG) 0.5 % Apply 1 application topically 2 (two) times daily.   . [DISCONTINUED] nitrofurantoin, macrocrystal-monohydrate, (MACROBID) 100 MG capsule Take 1 capsule (100 mg total) by mouth 2 (two) times daily.    No facility-administered encounter medications on file as of 09/09/2019.  Objective:   Goals Addressed            This Visit's Progress     Patient Stated   . "I have a lot of medications" (pt-stated)       CARE PLAN ENTRY (see longtitudinal plan of care for additional care plan information)  Current Barriers:  . Polypharmacy; complex patient with multiple comorbidities including tobacco abuse, CAD hx NSTEMI, COPD, depression . Has started receiving medications in adherence packaging from [facility]. She reports that she loves this service.  . Reports the ED visit for cellulitis. Has  not yet scheduled f/u with PCP o CAD/hx NSTEMI: atorvastatin 80 mg daily (LDL <100, though not at goal <70); metoprolol tartrate 25 mg BID, lisinopril 30 mg daily; Brilinta 90 mg BID; follows w/ Dr. Juliann Pares o Depression: trazodone 100 mg QPM; bupropion SR 200 mg BID; fluoxetine 60 mg daily  o COPD: Symbicort 160/4.5 mg BID, albuterol HFA PRN o Hypothyroidism: levothyroxine 25 mcg daily; 30 day supply filled in February o Constipation, nausea; was previously seen by Dr. Allegra Lai, but has not seen for f/u  Pharmacist Clinical Goal(s):  Marland Kitchen Over the next 90 days, patient will work with PharmD and provider towards optimized medication management  Interventions: . Comprehensive medication review performed, medication list updated in electronic medical record . Inter-disciplinary care team collaboration (see longitudinal plan of care) . Reviewed meds w/ [facility]. Patient needs refill on levothyroxine. Will collaborate w/ PCP on this . Would recommend recheck of lipids in the future, now that patient is on a more consistent fill plan. Could consider addition of ezetimibe if LDL remains >70 . Collaborated w/ office staff to call patient to schedule ED f/u with Dr. Laural Benes for leg wound  Patient Self Care Activities:  . Patient will take medications as prescribed  Please see past updates related to this goal by clicking on the "Past Updates" button in the selected goal          Plan:  - Scheduled f/u call in ~ 6 weeks  Catie Feliz Beam, PharmD, [facility] Clinical Pharmacist [facility] [phone]

## 2019-09-09 NOTE — Patient Instructions (Signed)
Visit Information  Goals Addressed            This Visit's Progress     Patient Stated   . "I have a lot of medications" (pt-stated)       CARE PLAN ENTRY (see longtitudinal plan of care for additional care plan information)  Current Barriers:  . Polypharmacy; complex patient with multiple comorbidities including tobacco abuse, CAD hx NSTEMI, COPD, depression . Has started receiving medications in adherence packaging from [facility]. She reports that she loves this service.  . Reports the ED visit for cellulitis. Has not yet scheduled f/u with PCP o CAD/hx NSTEMI: atorvastatin 80 mg daily (LDL <100, though not at goal <70); metoprolol tartrate 25 mg BID, lisinopril 30 mg daily; Brilinta 90 mg BID; follows w/ Dr. Juliann Pares o Depression: trazodone 100 mg QPM; bupropion SR 200 mg BID; fluoxetine 60 mg daily  o COPD: Symbicort 160/4.5 mg BID, albuterol HFA PRN o Hypothyroidism: levothyroxine 25 mcg daily; 30 day supply filled in February o Constipation, nausea; was previously seen by Dr. Allegra Lai, but has not seen for f/u  Pharmacist Clinical Goal(s):  Marland Kitchen Over the next 90 days, patient will work with PharmD and provider towards optimized medication management  Interventions: . Comprehensive medication review performed, medication list updated in electronic medical record . Inter-disciplinary care team collaboration (see longitudinal plan of care) . Reviewed meds w/ [facility]. Patient needs refill on levothyroxine. Will collaborate w/ PCP on this . Would recommend recheck of lipids in the future, now that patient is on a more consistent fill plan. Could consider addition of ezetimibe if LDL remains >70 . Collaborated w/ office staff to call patient to schedule ED f/u with Dr. Laural Benes for leg wound  Patient Self Care Activities:  . Patient will take medications as prescribed  Please see past updates related to this goal by clicking on the "Past Updates" button in the selected goal          Patient verbalizes understanding of instructions provided today.    Plan:  - Scheduled f/u call in ~ 6 weeks  Catie Feliz Beam, PharmD, [facility] Clinical Pharmacist [facility] [phone]

## 2019-09-15 ENCOUNTER — Other Ambulatory Visit: Payer: Self-pay | Admitting: Family Medicine

## 2019-09-15 NOTE — Telephone Encounter (Signed)
Needs appointment and labs

## 2019-09-15 NOTE — Telephone Encounter (Signed)
Called pt advised rx has been sent to pharmacy

## 2019-09-15 NOTE — Telephone Encounter (Signed)
Last filled 05/31/2019 for 30 tablets with 0 refills.   LOV: 06/24/2019

## 2019-09-15 NOTE — Telephone Encounter (Signed)
Requested medication (s) are due for refill today: yes  Requested medication (s) are on the active medication list: yes  Last refill:  05/31/19  Future visit scheduled: yes  1 week  Notes to clinic:  Patient reports not taking  Chronic care Mgt visit 09/09/19    Requested Prescriptions  Pending Prescriptions Disp Refills   levothyroxine (SYNTHROID) 25 MCG tablet [Pharmacy Med Name: LEVOTHYROXINE SODIUM 25 MCG TAB] 30 tablet 0    Sig: TAKE 1 TABLET BY MOUTH ONCE DAILY ON AN EMPTY STOMACH AT 6AM. WAIT 30 MINUTES BEFORE TAKING OTHER MEDS.      Endocrinology:  Hypothyroid Agents Failed - 09/15/2019 11:40 AM      Failed - TSH needs to be rechecked within 3 months after an abnormal result. Refill until TSH is due.      Passed - TSH in normal range and within 360 days    TSH  Date Value Ref Range Status  06/24/2019 1.980 0.450 - 4.500 uIU/mL Final          Passed - Valid encounter within last 12 months    Recent Outpatient Visits           2 months ago Coronary artery disease involving native coronary artery of native heart without angina pectoris   [facility] [place], Megan P, DO   8 months ago Benign hypertensive renal disease   [facility] [place], [place], DO   9 months ago Benign hypertensive renal disease   [facility] [place], [place], DO   1 year ago Chronic constipation   [facility] [place], [place], DO   1 year ago Acute colitis   [facility] Dorcas Carrow, DO       Future Appointments             In 1 week Laural Benes, Oralia Rud, DO [facility]

## 2019-09-15 NOTE — Telephone Encounter (Signed)
Pt is already scheduled for 09/23/19

## 2019-09-23 ENCOUNTER — Ambulatory Visit: Payer: Medicaid Other | Admitting: Family Medicine

## 2019-10-25 ENCOUNTER — Telehealth: Payer: Self-pay

## 2019-10-25 ENCOUNTER — Ambulatory Visit: Payer: Self-pay | Admitting: Pharmacist

## 2019-10-25 NOTE — Chronic Care Management (AMB) (Signed)
  Chronic Care Management   Note  10/25/2019 Name: Natalie Taylor MRN: [id number] DOB: [birth year]  Natalie Taylor is a 59 y.o. year old female who is a primary care patient of Dorcas Carrow, DO. The CCM team was consulted for assistance with chronic disease management and care coordination needs.    Attempted to contact patient for medication management review. Left HIPAA compliant message for patient to return my call at their convenience.   Plan: - Will collaborate with Care Guide to outreach to schedule follow up with me  Catie Feliz Beam, PharmD, [facility] Clinical Pharmacist [facility] [phone]

## 2019-10-26 ENCOUNTER — Telehealth: Payer: Self-pay | Admitting: Family Medicine

## 2019-10-26 NOTE — Chronic Care Management (AMB) (Signed)
  Care Management   Note  10/26/2019 Name: Natalie Taylor MRN: [id number] DOB: 09/14/1960  Natalie Taylor is a 59 y.o. year old female who is a primary care patient of Dorcas Carrow, DO and is actively engaged with the care management team. I reached out to Maryfrances Bunnell by phone today to assist with re-scheduling a follow up visit with the Pharmacist  Follow up plan: Unsuccessful telephone outreach attempt made. No answer No Machine  The care management team will reach out to the patient again over the next 7 days.  If patient returns call to provider office, please advise to call Embedded Care Management Care Guide Penne Lash  at [phone]  Penne Lash, RMA Care Guide, Embedded Care Coordination [facility]  [place], [place] [address] Direct Dial: [phone] [email or web address] Website: [email or web address]

## 2019-10-31 NOTE — Chronic Care Management (AMB) (Signed)
  Care Management   Note  10/31/2019 Name: Natalie Taylor MRN: [id number] DOB: [birth year]  Natalie Taylor is a 59 y.o. year old female who is a primary care patient of Dorcas Carrow, DO and is actively engaged with the care management team. I reached out to Maryfrances Bunnell by phone today to assist with re-scheduling a follow up visit with the Pharmacist  Follow up plan: Second Unsuccessful telephone outreach attempt made. A HIPPA compliant phone message was left for the patient providing contact information and requesting a return call.  The care management team will reach out to the patient again over the next 7 days.  If patient returns call to provider office, please advise to call Embedded Care Management Care Guide Penne Lash  at [phone]  Penne Lash, RMA Care Guide, Embedded Care Coordination [facility]  [place], [place] [address] Direct Dial: [phone] [email or web address] Website: [email or web address]

## 2019-11-07 NOTE — Chronic Care Management (AMB) (Signed)
  Care Management   Note  11/07/2019 Name: Natalie Taylor MRN: [id number] DOB: December 04, 1960  [name] is a 59 y.o. year old female who is a primary care patient of Dorcas Carrow, DO and is actively engaged with the care management team. I reached out to Maryfrances Bunnell by phone today to assist with re-scheduling a follow up visit with the Pharmacist  Follow up plan: Unable to make contact on outreach attempts x 3. PCP Olevia Perches DO and Vanice Sarah Pharm D  notified via routed documentation in medical record.   Penne Lash, RMA Care Guide, Embedded Care Coordination [facility]  [place], [place] [address] Direct Dial: [phone] [email or web address]: [email or web address]

## 2020-01-03 ENCOUNTER — Ambulatory Visit: Payer: Medicaid Other | Admitting: Family Medicine

## 2020-01-15 ENCOUNTER — Emergency Department: Payer: Medicaid Other

## 2020-01-15 ENCOUNTER — Other Ambulatory Visit: Payer: Self-pay

## 2020-01-15 ENCOUNTER — Emergency Department
Admission: EM | Admit: 2020-01-15 | Discharge: 2020-01-15 | Disposition: A | Discharge status: Home / Self Care | Payer: Medicaid Other | Attending: Emergency Medicine | Admitting: Emergency Medicine

## 2020-01-15 DIAGNOSIS — Z7951 Long term (current) use of inhaled steroids: Secondary | ICD-10-CM | POA: Insufficient documentation

## 2020-01-15 DIAGNOSIS — I2510 Atherosclerotic heart disease of native coronary artery without angina pectoris: Secondary | ICD-10-CM | POA: Insufficient documentation

## 2020-01-15 DIAGNOSIS — Z79899 Other long term (current) drug therapy: Secondary | ICD-10-CM | POA: Insufficient documentation

## 2020-01-15 DIAGNOSIS — S60921A Unspecified superficial injury of right hand, initial encounter: Secondary | ICD-10-CM | POA: Diagnosis present

## 2020-01-15 DIAGNOSIS — I10 Essential (primary) hypertension: Secondary | ICD-10-CM | POA: Diagnosis not present

## 2020-01-15 DIAGNOSIS — J449 Chronic obstructive pulmonary disease, unspecified: Secondary | ICD-10-CM | POA: Insufficient documentation

## 2020-01-15 DIAGNOSIS — F17210 Nicotine dependence, cigarettes, uncomplicated: Secondary | ICD-10-CM | POA: Diagnosis not present

## 2020-01-15 DIAGNOSIS — S62350A Nondisplaced fracture of shaft of second metacarpal bone, right hand, initial encounter for closed fracture: Secondary | ICD-10-CM | POA: Diagnosis not present

## 2020-01-15 MED ORDER — OXYCODONE-ACETAMINOPHEN 7.5-325 MG PO TABS
1.0000 | ORAL_TABLET | Freq: Four times a day (QID) | ORAL | 0 refills | Status: DC | PRN
Start: 2020-01-15 — End: 2020-03-07

## 2020-01-15 MED ORDER — IBUPROFEN 600 MG PO TABS
600.0000 mg | ORAL_TABLET | Freq: Once | ORAL | Status: AC
  Administered 2020-01-15: 600 mg via ORAL
  Filled 2020-01-15: qty 1

## 2020-01-15 MED ORDER — OXYCODONE-ACETAMINOPHEN 5-325 MG PO TABS
1.0000 | ORAL_TABLET | Freq: Once | ORAL | Status: AC
  Administered 2020-01-15: 1 via ORAL
  Filled 2020-01-15: qty 1

## 2020-01-15 MED ORDER — IBUPROFEN 600 MG PO TABS: 600.0000 mg | ORAL_TABLET | Freq: Three times a day (TID) | ORAL | 0 refills | Status: AC | PRN

## 2020-01-15 NOTE — ED Notes (Signed)
Volar splint and medium sling applied to left hand/arm. CMS intact, pt verbalizes greater discomfort.
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
## 2020-01-15 NOTE — Discharge Instructions (Addendum)
Wear splint and take medication as directed.  Call orthopedic clinic at 8:30 in the morning tell them you are follow-up in emergency room.  You will be scheduled for definitive evaluation and treatment.
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
## 2020-01-16 ENCOUNTER — Telehealth: Payer: Self-pay

## 2020-01-16 NOTE — Telephone Encounter (Signed)
Transition Care Management Unsuccessful Follow-up Telephone Call  Date of discharge and from where:   [facility] 01/15/2020  Attempts:  1st Attempt  Reason for unsuccessful TCM follow-up call:  Left voice message

## 2020-01-17 NOTE — Telephone Encounter (Signed)
Transition Care Management Unsuccessful Follow-up Telephone Call  Date of discharge and from where:  [facility]   Attempts:  2nd Attempt  Reason for unsuccessful TCM follow-up call:  Left voice message

## 2020-01-18 ENCOUNTER — Telehealth: Payer: Self-pay | Admitting: Family Medicine

## 2020-01-18 NOTE — Telephone Encounter (Signed)
Spoke with pt

## 2020-01-18 NOTE — Telephone Encounter (Signed)
Called pt she was not admitted to the hosp just was saw at the er. Pt has an appt 02/07/20 called to see if she wanted to see about being seen sooner, no answer, left vm  Copied from CRM [id number]. Topic: Appointment Scheduling - Scheduling Inquiry for Clinic >> Jan 17, 2020  3:18 PM Stephannie Li wrote: Reason for CRM: Patient as seen in the emergency dept. 01/15/2020, and will need a follow up .There are no appointments  available Thank you

## 2020-01-18 NOTE — Telephone Encounter (Signed)
Patient called back to speak with [place] regarding wanting to be seen earlier than 02/07/20 can be reached at Ph# [phone]

## 2020-01-23 DIAGNOSIS — S62351A Nondisplaced fracture of shaft of second metacarpal bone, left hand, initial encounter for closed fracture: Secondary | ICD-10-CM | POA: Diagnosis not present

## 2020-01-30 ENCOUNTER — Other Ambulatory Visit: Payer: Self-pay

## 2020-01-30 ENCOUNTER — Ambulatory Visit: Payer: Medicaid Other | Admitting: Family Medicine

## 2020-01-30 ENCOUNTER — Encounter: Payer: Self-pay | Admitting: Family Medicine

## 2020-01-30 VITALS — BP 132/84 | HR 72 | Temp 98.3°F | Resp 16 | Ht 66.0 in | Wt 147.0 lb

## 2020-01-30 DIAGNOSIS — E782 Mixed hyperlipidemia: Secondary | ICD-10-CM | POA: Diagnosis not present

## 2020-01-30 DIAGNOSIS — I129 Hypertensive chronic kidney disease with stage 1 through stage 4 chronic kidney disease, or unspecified chronic kidney disease: Secondary | ICD-10-CM

## 2020-01-30 DIAGNOSIS — I2510 Atherosclerotic heart disease of native coronary artery without angina pectoris: Secondary | ICD-10-CM | POA: Diagnosis not present

## 2020-01-30 DIAGNOSIS — J440 Chronic obstructive pulmonary disease with acute lower respiratory infection: Secondary | ICD-10-CM | POA: Diagnosis not present

## 2020-01-30 DIAGNOSIS — E039 Hypothyroidism, unspecified: Secondary | ICD-10-CM | POA: Diagnosis not present

## 2020-01-30 DIAGNOSIS — Z20822 Contact with and (suspected) exposure to covid-19: Secondary | ICD-10-CM

## 2020-01-30 DIAGNOSIS — J209 Acute bronchitis, unspecified: Secondary | ICD-10-CM | POA: Diagnosis not present

## 2020-01-30 MED ORDER — TICAGRELOR 90 MG PO TABS
ORAL_TABLET | ORAL | 0 refills | Status: DC
Start: 2020-01-30 — End: 2020-03-07

## 2020-01-30 MED ORDER — OMEPRAZOLE 20 MG PO CPDR: 20.0000 mg | DELAYED_RELEASE_CAPSULE | Freq: Two times a day (BID) | ORAL | 1 refills | Status: AC

## 2020-01-30 MED ORDER — ALBUTEROL SULFATE HFA 108 (90 BASE) MCG/ACT IN AERS: 2.0000 | INHALATION_SPRAY | Freq: Two times a day (BID) | RESPIRATORY_TRACT | 2 refills | Status: AC

## 2020-01-30 MED ORDER — DOXYCYCLINE HYCLATE 100 MG PO TABS: 100.0000 mg | ORAL_TABLET | Freq: Two times a day (BID) | ORAL | 0 refills | Status: DC

## 2020-01-30 MED ORDER — BUDESONIDE-FORMOTEROL FUMARATE 160-4.5 MCG/ACT IN AERO: 2.0000 | INHALATION_SPRAY | Freq: Two times a day (BID) | RESPIRATORY_TRACT | 6 refills | Status: AC

## 2020-01-30 MED ORDER — PREDNISONE 10 MG PO TABS: ORAL_TABLET | ORAL | 0 refills | Status: DC

## 2020-01-30 NOTE — Assessment & Plan Note (Signed)
Has been off her medicine. Will hold on restarting until after her illness, then check blood and treat as needed.

## 2020-01-30 NOTE — Assessment & Plan Note (Signed)
Refill of brintella given today. Will check labs after illness.

## 2020-01-30 NOTE — Progress Notes (Signed)
BP 132/84   Pulse 72   Temp 98.3 F (36.8 C) (Oral)   Resp 16   Ht 5\' 6"  (1.676 m)   Wt 147 lb (66.7 kg)   SpO2 95%   BMI 23.73 kg/m    Subjective:    Patient ID: , female    DOB: February 20, 1961, 59 y.o.   MRN: [id number]  HPI: Natalie Taylor is a 59 y.o. female  Chief Complaint  Patient presents with  . chest congestion    fever --cough---chills and bodyache, nausea but takes meds, onset 4 days   UPPER RESPIRATORY TRACT INFECTION Duration: 4 days Worst symptom: chest congestion +Loss of Taste and Smell Fever: yes Cough: yes Shortness of breath: yes Wheezing: yes Chest pain: yes, with cough Chest tightness: yes Chest congestion: yes Nasal congestion: no Runny nose: no Post nasal drip: no Sneezing: yes Sore throat: no Swollen glands: no Sinus pressure: yes Headache: yes Face pain: no Toothache: no Ear pain: no  Ear pressure: no  Eyes red/itching:no Eye drainage/crusting: no  Vomiting: no Rash: no Fatigue: yes Sick contacts: yes Strep contacts: no  Context: worse Recurrent sinusitis: no Relief with OTC cold/cough medications: no  Treatments attempted: none   Relevant past medical, surgical, family and social history reviewed and updated as indicated. Interim medical history since our last visit reviewed. Allergies and medications reviewed and updated.  Review of Systems  Constitutional: Positive for chills, diaphoresis, fatigue and fever. Negative for activity change, appetite change and unexpected weight change.  HENT: Positive for congestion and sneezing. Negative for dental problem, drooling, ear discharge, ear pain, facial swelling, hearing loss, mouth sores, nosebleeds, postnasal drip, rhinorrhea, sinus pressure, sinus pain, sore throat, tinnitus, trouble swallowing and voice change.   Eyes: Negative.   Respiratory: Positive for cough, chest tightness, shortness of breath and wheezing. Negative for apnea, choking and stridor.     Cardiovascular: Negative.   Gastrointestinal: Negative.   Neurological: Negative.   Psychiatric/Behavioral: Negative.     Per HPI unless specifically indicated above     Objective:    BP 132/84   Pulse 72   Temp 98.3 F (36.8 C) (Oral)   Resp 16   Ht 5\' 6"  (1.676 m)   Wt 147 lb (66.7 kg)   SpO2 95%   BMI 23.73 kg/m   Wt Readings from Last 3 Encounters:  01/30/20 147 lb (66.7 kg)  01/15/20 142 lb (64.4 kg)  09/04/19 140 lb (63.5 kg)    Physical Exam Vitals and nursing note reviewed.  Constitutional:      General: She is not in acute distress.    Appearance: Normal appearance. She is not ill-appearing, toxic-appearing or diaphoretic.  HENT:     Head: Normocephalic and atraumatic.     Right Ear: External ear normal.     Left Ear: External ear normal.     Nose: Nose normal.     Mouth/Throat:     Mouth: Mucous membranes are moist.     Pharynx: Oropharynx is clear.  Eyes:     General: No scleral icterus.       Right eye: No discharge.        Left eye: No discharge.     Extraocular Movements: Extraocular movements intact.     Conjunctiva/sclera: Conjunctivae normal.     Pupils: Pupils are equal, round, and reactive to light.  Cardiovascular:     Rate and Rhythm: Normal rate and regular rhythm.     Pulses: Normal  pulses.     Heart sounds: Normal heart sounds. No murmur heard.  No friction rub. No gallop.   Pulmonary:     Effort: Pulmonary effort is normal. No respiratory distress.     Breath sounds: No stridor. Wheezing (througout) present. No rhonchi or rales.  Chest:     Chest wall: No tenderness.  Musculoskeletal:        General: Normal range of motion.     Cervical back: Normal range of motion and neck supple.  Skin:    General: Skin is warm and dry.     Capillary Refill: Capillary refill takes less than 2 seconds.     Coloration: Skin is not jaundiced or pale.     Findings: No bruising, erythema, lesion or rash.  Neurological:     General: No focal  deficit present.     Mental Status: She is alert and oriented to person, place, and time. Mental status is at baseline.  Psychiatric:        Mood and Affect: Mood normal.        Behavior: Behavior normal.        Thought Content: Thought content normal.        Judgment: Judgment normal.     Results for orders placed or performed in visit on 06/24/19  Microscopic Examination   URINE  Result Value Ref Range   WBC, UA 6-10 (A) 0 - 5 /hpf   RBC None seen 0 - 2 /hpf   Epithelial Cells (non renal) 0-10 0 - 10 /hpf   Bacteria, UA Many (A) None seen/Few  Urine Culture, Reflex   URINE  Result Value Ref Range   Urine Culture, Routine Final report (A)    Organism ID, Bacteria Klebsiella oxytoca (A)    ORGANISM ID, BACTERIA Lactobacillus species    Antimicrobial Susceptibility Comment   CBC with Differential/Platelet  Result Value Ref Range   WBC 8.6 3.4 - 10.8 x10E3/uL   RBC 4.15 3.77 - 5.28 x10E6/uL   Hemoglobin 12.7 11.1 - 15.9 g/dL   Hematocrit 97.9 89.2 - 46.6 %   MCV 94 79 - 97 fL   MCH 30.6 26.6 - 33.0 pg   MCHC 32.6 31 - 35 g/dL   RDW 11.9 41.7 - 40.8 %   Platelets 212 150 - 450 x10E3/uL   Neutrophils 64 Not Estab. %   Lymphs 23 Not Estab. %   Monocytes 8 Not Estab. %   Eos 4 Not Estab. %   Basos 1 Not Estab. %   Neutrophils Absolute 5.5 1 - 7 x10E3/uL   Lymphocytes Absolute 2.0 0 - 3 x10E3/uL   Monocytes Absolute 0.7 0 - 0 x10E3/uL   EOS (ABSOLUTE) 0.4 0.0 - 0.4 x10E3/uL   Basophils Absolute 0.1 0 - 0 x10E3/uL   Immature Granulocytes 0 Not Estab. %   Immature Grans (Abs) 0.0 0.0 - 0.1 x10E3/uL  Comprehensive metabolic panel  Result Value Ref Range   Glucose 85 65 - 99 mg/dL   BUN 25 (H) 6 - 24 mg/dL   Creatinine, Ser 1.44 (H) 0.57 - 1.00 mg/dL   GFR calc non Af Amer 56 (L) >59 mL/min/1.73   GFR calc Af Amer 65 >59 mL/min/1.73   BUN/Creatinine Ratio 23 9 - 23   Sodium 140 134 - 144 mmol/L   Potassium 4.5 3.5 - 5.2 mmol/L   Chloride 108 (H) 96 - 106 mmol/L   CO2  23 20 - 29 mmol/L   Calcium 8.3 (L)  8.7 - 10.2 mg/dL   Total Protein 6.6 6.0 - 8.5 g/dL   Albumin 3.8 3.8 - 4.9 g/dL   Globulin, Total 2.8 1.5 - 4.5 g/dL   Albumin/Globulin Ratio 1.4 1.2 - 2.2   Bilirubin Total <0.2 0.0 - 1.2 mg/dL   Alkaline Phosphatase 133 (H) 39 - 117 IU/L   AST 11 0 - 40 IU/L   ALT 7 0 - 32 IU/L  Lipid Panel w/o Chol/HDL Ratio  Result Value Ref Range   Cholesterol, Total 142 100 - 199 mg/dL   Triglycerides 73 0 - 149 mg/dL   HDL 48 >37 mg/dL   VLDL Cholesterol Cal 14 5 - 40 mg/dL   LDL Chol Calc (NIH) 80 0 - 99 mg/dL  Microalbumin, Urine Waived  Result Value Ref Range   Microalb, Ur Waived 30 (H) 0 - 19 mg/L   Creatinine, Urine Waived 50 10 - 300 mg/dL   Microalb/Creat Ratio 30-300 (H) <30 mg/g  TSH  Result Value Ref Range   TSH 1.980 0.450 - 4.500 uIU/mL  UA/M w/rflx Culture, Routine   Specimen: Urine   URINE  Result Value Ref Range   Specific Gravity, UA 1.020 1.005 - 1.030   pH, UA 7.0 5.0 - 7.5   Color, UA Yellow Yellow   Appearance Ur Hazy (A) Clear   Leukocytes,UA 3+ (A) Negative   Protein,UA 1+ (A) Negative/Trace   Glucose, UA Negative Negative   Ketones, UA Negative Negative   RBC, UA Negative Negative   Bilirubin, UA Negative Negative   Urobilinogen, Ur 0.2 0.2 - 1.0 mg/dL   Nitrite, UA Positive (A) Negative   Microscopic Examination See below:    Urinalysis Reflex Comment       Assessment & Plan:   Problem List Items Addressed This Visit      Cardiovascular and Mediastinum   Coronary artery disease    Refill of brintella given today. Will check labs after illness.         Endocrine   Hypothyroidism    Has not been taking her medicine. Will get her back after illness and check blood  And treat as needed.         Genitourinary   Benign hypertensive renal disease    Has been off her medicine. BP good today. Will hold on restarting until after her illness, then check blood and treat as needed.         Other   HLD  (hyperlipidemia)    Has been off her medicine. Will hold on restarting until after her illness, then check blood and treat as needed.        Other Visit Diagnoses    Suspected COVID-19 virus infection    -  Primary   Swabbed today. Self-quarantine until results back. Continue to monitor. Call with any concerns.    Relevant Orders   Novel Coronavirus, NAA ([facility])   Acute bronchitis with COPD (HCC)       Will treat with prednisone and doxycycline. Refills of inhalers given today. Call with any concerns.    Relevant Medications   albuterol (VENTOLIN HFA) 108 (90 Base) MCG/ACT inhaler   budesonide-formoterol (SYMBICORT) 160-4.5 MCG/ACT inhaler   predniSONE (DELTASONE) 10 MG tablet       Follow up plan: Return in about 3 weeks (around 02/20/2020).  >40 minutes spent with patient today

## 2020-01-30 NOTE — Assessment & Plan Note (Signed)
Has been off her medicine. BP good today. Will hold on restarting until after her illness, then check blood and treat as needed.

## 2020-01-30 NOTE — Assessment & Plan Note (Signed)
Has not been taking her medicine. Will get her back after illness and check blood  And treat as needed.

## 2020-01-31 LAB — NOVEL CORONAVIRUS, NAA: SARS-CoV-2, NAA: NOT DETECTED

## 2020-01-31 LAB — SARS-COV-2, NAA 2 DAY TAT

## 2020-02-01 DIAGNOSIS — S62351A Nondisplaced fracture of shaft of second metacarpal bone, left hand, initial encounter for closed fracture: Secondary | ICD-10-CM | POA: Diagnosis not present

## 2020-02-07 ENCOUNTER — Ambulatory Visit: Payer: Self-pay | Admitting: Family Medicine

## 2020-02-10 ENCOUNTER — Other Ambulatory Visit: Payer: Self-pay | Admitting: Family Medicine

## 2020-02-10 NOTE — Telephone Encounter (Signed)
Requested medication (s) are due for refill today: no  Requested medication (s) are on the active medication list: yes  Last refill:  01/30/2020  Future visit scheduled:  yes  Notes to clinic: this refill cannot be delegated    Requested Prescriptions  Pending Prescriptions Disp Refills   predniSONE (DELTASONE) 10 MG tablet [Pharmacy Med Name: PREDNISONE 10 MG TAB] 42 tablet 0    Sig: TAKE 6 TABLETS BY MOUTH DAILY FOR 2 DAYSTHEN DECREASE BY 1 TABLET EVERY OTHER DAY UNTIL FINISHED      Not Delegated - Endocrinology:  Oral Corticosteroids Failed - 02/10/2020 11:00 AM      Failed - This refill cannot be delegated      Passed - Last BP in normal range    BP Readings from Last 1 Encounters:  01/30/20 132/84          Passed - Valid encounter within last 6 months    Recent Outpatient Visits           1 week ago Suspected COVID-19 virus infection   [facility] [place], Megan P, DO   7 months ago Coronary artery disease involving native coronary artery of native heart without angina pectoris   [facility] [place], [place], DO   1 year ago Benign hypertensive renal disease   [facility] [place], [place], DO   1 year ago Benign hypertensive renal disease   [facility] [place], [place], DO   1 year ago Chronic constipation   [facility] [place], [place], DO       Future Appointments             In 2 weeks Laural Benes, Oralia Rud, DO [employer], [facility]

## 2020-02-27 ENCOUNTER — Ambulatory Visit: Payer: Medicaid Other | Admitting: Family Medicine

## 2020-03-07 ENCOUNTER — Ambulatory Visit (INDEPENDENT_AMBULATORY_CARE_PROVIDER_SITE_OTHER): Payer: Medicaid Other | Admitting: Family Medicine

## 2020-03-07 ENCOUNTER — Ambulatory Visit
Admission: RE | Admit: 2020-03-07 | Discharge: 2020-03-07 | Disposition: A | Discharge status: Home / Self Care | Payer: Medicaid Other | Attending: Family Medicine | Admitting: Family Medicine

## 2020-03-07 ENCOUNTER — Ambulatory Visit
Admission: RE | Admit: 2020-03-07 | Discharge: 2020-03-07 | Disposition: A | Discharge status: Home / Self Care | Payer: Medicaid Other | Source: Ambulatory Visit | Attending: Family Medicine | Admitting: Family Medicine

## 2020-03-07 ENCOUNTER — Other Ambulatory Visit: Payer: Self-pay

## 2020-03-07 VITALS — BP 159/107 | HR 78 | Temp 98.4°F | Ht 65.0 in | Wt 142.4 lb

## 2020-03-07 DIAGNOSIS — I129 Hypertensive chronic kidney disease with stage 1 through stage 4 chronic kidney disease, or unspecified chronic kidney disease: Secondary | ICD-10-CM | POA: Diagnosis not present

## 2020-03-07 DIAGNOSIS — M545 Low back pain, unspecified: Secondary | ICD-10-CM

## 2020-03-07 DIAGNOSIS — Z23 Encounter for immunization: Secondary | ICD-10-CM | POA: Diagnosis not present

## 2020-03-07 DIAGNOSIS — I2510 Atherosclerotic heart disease of native coronary artery without angina pectoris: Secondary | ICD-10-CM

## 2020-03-07 DIAGNOSIS — E782 Mixed hyperlipidemia: Secondary | ICD-10-CM | POA: Diagnosis not present

## 2020-03-07 DIAGNOSIS — F332 Major depressive disorder, recurrent severe without psychotic features: Secondary | ICD-10-CM | POA: Diagnosis not present

## 2020-03-07 DIAGNOSIS — E039 Hypothyroidism, unspecified: Secondary | ICD-10-CM | POA: Diagnosis not present

## 2020-03-07 DIAGNOSIS — R8281 Pyuria: Secondary | ICD-10-CM | POA: Diagnosis not present

## 2020-03-07 LAB — URINALYSIS, ROUTINE W REFLEX MICROSCOPIC
Bilirubin, UA: NEGATIVE
Glucose, UA: NEGATIVE
Ketones, UA: NEGATIVE
Nitrite, UA: POSITIVE — AB
Specific Gravity, UA: 1.015 (ref 1.005–1.030)
Urobilinogen, Ur: 0.2 mg/dL (ref 0.2–1.0)
pH, UA: 6 (ref 5.0–7.5)

## 2020-03-07 LAB — MICROSCOPIC EXAMINATION

## 2020-03-07 LAB — MICROALBUMIN, URINE WAIVED
Creatinine, Urine Waived: 200 mg/dL (ref 10–300)
Microalb, Ur Waived: 80 mg/L — ABNORMAL HIGH (ref 0–19)

## 2020-03-07 MED ORDER — ATORVASTATIN CALCIUM 80 MG PO TABS: 80.0000 mg | ORAL_TABLET | Freq: Every day | ORAL | 3 refills | Status: AC

## 2020-03-07 MED ORDER — LISINOPRIL 10 MG PO TABS
10.0000 mg | ORAL_TABLET | Freq: Every day | ORAL | 3 refills | Status: DC
Start: 2020-03-07 — End: 2020-03-28

## 2020-03-07 MED ORDER — CYCLOBENZAPRINE HCL 5 MG PO TABS
5.0000 mg | ORAL_TABLET | Freq: Every day | ORAL | 2 refills | Status: DC
Start: 2020-03-07 — End: 2020-07-24

## 2020-03-07 MED ORDER — VALACYCLOVIR HCL 1 G PO TABS: 1000.0000 mg | ORAL_TABLET | Freq: Two times a day (BID) | ORAL | 12 refills | Status: AC

## 2020-03-07 MED ORDER — TICAGRELOR 90 MG PO TABS: ORAL_TABLET | ORAL | 0 refills | Status: AC

## 2020-03-07 MED ORDER — BUPROPION HCL ER (XL) 150 MG PO TB24: 150.0000 mg | ORAL_TABLET | Freq: Every day | ORAL | 2 refills | Status: DC

## 2020-03-07 MED ORDER — METOPROLOL TARTRATE 25 MG PO TABS
25.0000 mg | ORAL_TABLET | Freq: Two times a day (BID) | ORAL | 3 refills | Status: DC
Start: 2020-03-07 — End: 2020-07-24

## 2020-03-07 NOTE — Assessment & Plan Note (Signed)
Rechecking labs today. Await results. Treat as needed.  °

## 2020-03-07 NOTE — Assessment & Plan Note (Signed)
Will get her back into see cardiology- referral generated today. Continue brintella and metoprolol. Refills given today.

## 2020-03-07 NOTE — Assessment & Plan Note (Signed)
Not under good control. We will restart her wellbutrin and recheck 3-4 weeks. Call with any concerns.

## 2020-03-07 NOTE — Progress Notes (Signed)
BP (!) 159/107   Pulse 78   Temp 98.4 F (36.9 C)   Ht 5\' 5"  (1.651 m)   Wt 142 lb 6.4 oz (64.6 kg)   BMI 23.70 kg/m    Subjective:    Patient ID: , female    DOB: [birth year], 59 y.o.   MRN: [id number]  HPI: Natalie Taylor is a 59 y.o. female  Chief Complaint  Patient presents with  . Bronchitis    Doing Better  . Hypertension  . Anxiety    Depression, has a lot going on at home.  . Arthritis    in B/L hands, starting to go up her arms  . Spasms    B/L legs, when walking she has to stop for a few mins before she can move again   HYPERTENSION / HYPERLIPIDEMIA Satisfied with current treatment? no Duration of hypertension: chronic BP monitoring frequency: not checking BP range:  BP medication side effects: no Past BP meds: lisinopril, HCTZ, metoprolol Duration of hyperlipidemia: chronic Cholesterol medication side effects: not been on anything Cholesterol supplements: none Past cholesterol medications: none Medication compliance: excellent compliance Aspirin: no Recent stressors: yes Recurrent headaches: no Visual changes: no Palpitations: no Dyspnea: no Chest pain: no Lower extremity edema: no Dizzy/lightheaded: no  ANXIETY/DEPRESSION- has been going through a lot. Having issues with her husband and her children Duration: chronic Status:exacerbated Anxious mood: yes  Excessive worrying: yes Irritability: yes  Sweating: no Nausea: no Palpitations:no Hyperventilation: no Panic attacks: yes Agoraphobia: no  Obscessions/compulsions: no Depressed mood: yes Depression screen [facility] 2/9 03/07/2020 06/24/2019 01/04/2019 12/13/2018 03/04/2018  Decreased Interest 3 1 1 3 3   Down, Depressed, Hopeless 3 0 2 3 3   PHQ - 2 Score 6 1 3 6 6   Altered sleeping 3 0 2 2 3   Tired, decreased energy 3 0 1 3 3   Change in appetite 3 0 0 2 3  Feeling bad or failure about yourself  3 0 1 3 -  Trouble concentrating 3 0 2 3 2   Moving slowly or fidgety/restless 3 0 3  3 2   Suicidal thoughts 3 0 0 3 2  PHQ-9 Score 27 1 12 25 21   Difficult doing work/chores Extremely dIfficult - Very difficult Extremely dIfficult Very difficult  Some recent data might be hidden   Anhedonia: no Weight changes: no Insomnia: yes hard to fall asleep  Hypersomnia: no Fatigue/loss of energy: yes Feelings of worthlessness: yes Feelings of guilt: yes Impaired concentration/indecisiveness: yes Suicidal ideations: no  Crying spells: yes Recent Stressors/Life Changes: yes   Relationship problems: yes   Family stress: yes     Financial stress: yes    Job stress: no    Recent death/loss: no  Has been having spasms in her legs for the past few months. She notes that she has pain in both legs and makes it hard for her to walk.   Relevant past medical, surgical, family and social history reviewed and updated as indicated. Interim medical history since our last visit reviewed. Allergies and medications reviewed and updated.  Review of Systems  Constitutional: Negative.   Respiratory: Negative.   Cardiovascular: Negative.   Gastrointestinal: Negative.   Musculoskeletal: Positive for back pain and myalgias. Negative for arthralgias, gait problem, joint swelling, neck pain and neck stiffness.  Skin: Negative.   Neurological: Negative.   Psychiatric/Behavioral: Negative.     Per HPI unless specifically indicated above     Objective:    BP (!) 159/107  Pulse 78   Temp 98.4 F (36.9 C)   Ht 5\' 5"  (1.651 m)   Wt 142 lb 6.4 oz (64.6 kg)   BMI 23.70 kg/m   Wt Readings from Last 3 Encounters:  03/07/20 142 lb 6.4 oz (64.6 kg)  01/30/20 147 lb (66.7 kg)  01/15/20 142 lb (64.4 kg)    Physical Exam Vitals and nursing note reviewed.  Constitutional:      General: She is not in acute distress.    Appearance: Normal appearance. She is not ill-appearing, toxic-appearing or diaphoretic.  HENT:     Head: Normocephalic and atraumatic.     Right Ear: External ear normal.       Left Ear: External ear normal.     Nose: Nose normal.     Mouth/Throat:     Mouth: Mucous membranes are moist.     Pharynx: Oropharynx is clear.  Eyes:     General: No scleral icterus.       Right eye: No discharge.        Left eye: No discharge.     Extraocular Movements: Extraocular movements intact.     Conjunctiva/sclera: Conjunctivae normal.     Pupils: Pupils are equal, round, and reactive to light.  Cardiovascular:     Rate and Rhythm: Normal rate and regular rhythm.     Pulses: Normal pulses.     Heart sounds: Normal heart sounds. No murmur heard.  No friction rub. No gallop.   Pulmonary:     Effort: Pulmonary effort is normal. No respiratory distress.     Breath sounds: Normal breath sounds. No stridor. No wheezing, rhonchi or rales.  Chest:     Chest wall: No tenderness.  Musculoskeletal:        General: Normal range of motion.     Cervical back: Normal range of motion and neck supple.  Skin:    General: Skin is warm and dry.     Capillary Refill: Capillary refill takes less than 2 seconds.     Coloration: Skin is not jaundiced or pale.     Findings: No bruising, erythema, lesion or rash.  Neurological:     General: No focal deficit present.     Mental Status: She is alert and oriented to person, place, and time. Mental status is at baseline.  Psychiatric:        Mood and Affect: Mood normal.        Behavior: Behavior normal.        Thought Content: Thought content normal.        Judgment: Judgment normal.     Results for orders placed or performed in visit on 03/07/20  Microscopic Examination   Urine  Result Value Ref Range   WBC, UA 11-30 (A) 0 - 5 /hpf   RBC 3-10 (A) 0 - 2 /hpf   Epithelial Cells (non renal) 0-10 0 - 10 /hpf   Bacteria, UA Few (A) None seen/Few  CBC with Differential/Platelet  Result Value Ref Range   WBC 8.7 3.4 - 10.8 x10E3/uL   RBC 4.61 3.77 - 5.28 x10E6/uL   Hemoglobin 13.0 11.1 - 15.9 g/dL   Hematocrit 16.140.4 09.634.0 - 46.6 %    MCV 88 79 - 97 fL   MCH 28.2 26.6 - 33.0 pg   MCHC 32.2 31 - 35 g/dL   RDW 04.513.2 40.911.7 - 81.115.4 %   Platelets 246 150 - 450 x10E3/uL   Neutrophils 68 Not Estab. %   Lymphs  21 Not Estab. %   Monocytes 8 Not Estab. %   Eos 2 Not Estab. %   Basos 1 Not Estab. %   Neutrophils Absolute 5.9 1.40 - 7.00 x10E3/uL   Lymphocytes Absolute 1.8 0 - 3 x10E3/uL   Monocytes Absolute 0.7 0 - 0 x10E3/uL   EOS (ABSOLUTE) 0.2 0.0 - 0.4 x10E3/uL   Basophils Absolute 0.1 0 - 0 x10E3/uL   Immature Granulocytes 0 Not Estab. %   Immature Grans (Abs) 0.0 0.0 - 0.1 x10E3/uL  Comprehensive metabolic panel  Result Value Ref Range   Glucose 87 65 - 99 mg/dL   BUN 10 6 - 24 mg/dL   Creatinine, Ser 6.06 0.57 - 1.00 mg/dL   GFR calc non Af Amer 64 >59 mL/min/1.73   GFR calc Af Amer 74 >59 mL/min/1.73   BUN/Creatinine Ratio 10 9 - 23   Sodium 142 134 - 144 mmol/L   Potassium 4.0 3.5 - 5.2 mmol/L   Chloride 106 96 - 106 mmol/L   CO2 23 20 - 29 mmol/L   Calcium 9.2 8.7 - 10.2 mg/dL   Total Protein 7.1 6.0 - 8.5 g/dL   Albumin 4.0 3.8 - 4.9 g/dL   Globulin, Total 3.1 1.5 - 4.5 g/dL   Albumin/Globulin Ratio 1.3 1.2 - 2.2   Bilirubin Total 0.3 0.0 - 1.2 mg/dL   Alkaline Phosphatase 130 (H) 44 - 121 IU/L   AST 11 0 - 40 IU/L   ALT 8 0 - 32 IU/L  Lipid Panel w/o Chol/HDL Ratio  Result Value Ref Range   Cholesterol, Total 235 (H) 100 - 199 mg/dL   Triglycerides 301 0 - 149 mg/dL   HDL 50 >60 mg/dL   VLDL Cholesterol Cal 21 5 - 40 mg/dL   LDL Chol Calc (NIH) 109 (H) 0 - 99 mg/dL  Microalbumin, Urine Waived  Result Value Ref Range   Microalb, Ur Waived 80 (H) 0 - 19 mg/L   Creatinine, Urine Waived 200 10 - 300 mg/dL   Microalb/Creat Ratio 30-300 (H) <30 mg/g  TSH  Result Value Ref Range   TSH 5.320 (H) 0.450 - 4.500 uIU/mL  Urinalysis, Routine w reflex microscopic  Result Value Ref Range   Specific Gravity, UA 1.015 1.005 - 1.030   pH, UA 6.0 5.0 - 7.5   Color, UA Yellow Yellow   Appearance Ur Clear Clear     Leukocytes,UA 2+ (A) Negative   Protein,UA 1+ (A) Negative/Trace   Glucose, UA Negative Negative   Ketones, UA Negative Negative   RBC, UA Trace (A) Negative   Bilirubin, UA Negative Negative   Urobilinogen, Ur 0.2 0.2 - 1.0 mg/dL   Nitrite, UA Positive (A) Negative   Microscopic Examination See below:       Assessment & Plan:   Problem List Items Addressed This Visit      Cardiovascular and Mediastinum   Coronary artery disease    Will get her back into see cardiology- referral generated today. Continue brintella and metoprolol. Refills given today.       Relevant Medications   atorvastatin (LIPITOR) 80 MG tablet   lisinopril (ZESTRIL) 10 MG tablet   metoprolol tartrate (LOPRESSOR) 25 MG tablet   Other Relevant Orders   CBC with Differential/Platelet (Completed)   Comprehensive metabolic panel (Completed)   Ambulatory referral to Cardiology     Endocrine   Hypothyroidism    Rechecking labs today. Await results. Treat as needed.       Relevant Medications  metoprolol tartrate (LOPRESSOR) 25 MG tablet   Other Relevant Orders   CBC with Differential/Platelet (Completed)   Comprehensive metabolic panel (Completed)   TSH (Completed)     Genitourinary   Benign hypertensive renal disease - Primary    Not under good control. Will restart her lisinopril and metoprolol at a lower dose- recheck 2-3 weeks, adjust medicine as needed.       Relevant Orders   CBC with Differential/Platelet (Completed)   Comprehensive metabolic panel (Completed)   Microalbumin, Urine Waived (Completed)   Urinalysis, Routine w reflex microscopic (Completed)     Other   HLD (hyperlipidemia)    Has been off her medicine. Will restart. Recheck at follow up.       Relevant Medications   atorvastatin (LIPITOR) 80 MG tablet   lisinopril (ZESTRIL) 10 MG tablet   metoprolol tartrate (LOPRESSOR) 25 MG tablet   Other Relevant Orders   CBC with Differential/Platelet (Completed)   Comprehensive  metabolic panel (Completed)   Lipid Panel w/o Chol/HDL Ratio (Completed)   Depression    Not under good control. We will restart her wellbutrin and recheck 3-4 weeks. Call with any concerns.       Relevant Medications   buPROPion (WELLBUTRIN XL) 150 MG 24 hr tablet   Other Relevant Orders   Referral to Chronic Care Management Services    Other Visit Diagnoses    Acute bilateral low back pain without sciatica       Will obtain x-ray and treat with flexeril. Call with any concerns. Continue to monitor.    Relevant Medications   cyclobenzaprine (FLEXERIL) 5 MG tablet   Other Relevant Orders   DG Lumbar Spine Complete   Pyuria       Will check urine culture. Await results.    Relevant Orders   Urine Culture   Need for influenza vaccination       Flu shot given today.   Relevant Orders   Flu Vaccine QUAD 6+ mos PF IM (Fluarix Quad PF) (Completed)       Follow up plan: Return in about 4 weeks (around 04/04/2020).

## 2020-03-07 NOTE — Assessment & Plan Note (Signed)
Has been off her medicine. Will restart. Recheck at follow up.

## 2020-03-07 NOTE — Assessment & Plan Note (Signed)
Not under good control. Will restart her lisinopril and metoprolol at a lower dose- recheck 2-3 weeks, adjust medicine as needed.

## 2020-03-08 ENCOUNTER — Telehealth: Payer: Self-pay

## 2020-03-08 ENCOUNTER — Encounter: Payer: Self-pay | Admitting: Family Medicine

## 2020-03-08 LAB — LIPID PANEL W/O CHOL/HDL RATIO
Cholesterol, Total: 235 mg/dL — ABNORMAL HIGH (ref 100–199)
HDL: 50 mg/dL (ref >39)
LDL Chol Calc (NIH): 164 mg/dL — ABNORMAL HIGH (ref 0–99)
Triglycerides: 115 mg/dL (ref 0–149)
VLDL Cholesterol Cal: 21 mg/dL (ref 5–40)

## 2020-03-08 LAB — COMPREHENSIVE METABOLIC PANEL
ALT: 8 IU/L (ref 0–32)
AST: 11 IU/L (ref 0–40)
Albumin/Globulin Ratio: 1.3 (ref 1.2–2.2)
Albumin: 4 g/dL (ref 3.8–4.9)
Alkaline Phosphatase: 130 IU/L — ABNORMAL HIGH (ref 44–121)
BUN/Creatinine Ratio: 10 (ref 9–23)
BUN: 10 mg/dL (ref 6–24)
Bilirubin Total: 0.3 mg/dL (ref 0.0–1.2)
CO2: 23 mmol/L (ref 20–29)
Calcium: 9.2 mg/dL (ref 8.7–10.2)
Chloride: 106 mmol/L (ref 96–106)
Creatinine, Ser: 0.98 mg/dL (ref 0.57–1.00)
GFR calc Af Amer: 74 mL/min/{1.73_m2} (ref >59)
GFR calc non Af Amer: 64 mL/min/{1.73_m2} (ref >59)
Globulin, Total: 3.1 g/dL (ref 1.5–4.5)
Glucose: 87 mg/dL (ref 65–99)
Potassium: 4 mmol/L (ref 3.5–5.2)
Sodium: 142 mmol/L (ref 134–144)
Total Protein: 7.1 g/dL (ref 6.0–8.5)

## 2020-03-08 LAB — CBC WITH DIFFERENTIAL/PLATELET
Basophils Absolute: 0.1 10*3/uL (ref 0.0–0.2)
Basos: 1 %
EOS (ABSOLUTE): 0.2 10*3/uL (ref 0.0–0.4)
Eos: 2 %
Hematocrit: 40.4 % (ref 34.0–46.6)
Hemoglobin: 13 g/dL (ref 11.1–15.9)
Immature Grans (Abs): 0 10*3/uL (ref 0.0–0.1)
Immature Granulocytes: 0 %
Lymphocytes Absolute: 1.8 10*3/uL (ref 0.7–3.1)
Lymphs: 21 %
MCH: 28.2 pg (ref 26.6–33.0)
MCHC: 32.2 g/dL (ref 31.5–35.7)
MCV: 88 fL (ref 79–97)
Monocytes Absolute: 0.7 10*3/uL (ref 0.1–0.9)
Monocytes: 8 %
Neutrophils Absolute: 5.9 10*3/uL (ref 1.4–7.0)
Neutrophils: 68 %
Platelets: 246 10*3/uL (ref 150–450)
RBC: 4.61 x10E6/uL (ref 3.77–5.28)
RDW: 13.2 % (ref 11.7–15.4)
WBC: 8.7 10*3/uL (ref 3.4–10.8)

## 2020-03-08 LAB — TSH: TSH: 5.32 u[IU]/mL — ABNORMAL HIGH (ref 0.450–4.500)

## 2020-03-08 NOTE — Chronic Care Management (AMB) (Signed)
  Chronic Care Management   Note  03/08/2020 Name: Natalie Taylor MRN: [id number] DOB: 12-Mar-1961  Natalie Taylor is a 59 y.o. year old female who is a primary care patient of Dorcas Carrow, DO. Natalie Taylor is currently enrolled in care management services. An additional referral for LCSW was placed.   Follow up plan: Telephone appointment with care management team member scheduled for:04/06/2020  Penne Lash, RMA Care Guide, Embedded Care Coordination [facility]  [place], [place] [address] Direct Dial: [phone] [email or web address]

## 2020-03-09 ENCOUNTER — Other Ambulatory Visit: Payer: Self-pay | Admitting: Family Medicine

## 2020-03-09 MED ORDER — LEVOTHYROXINE SODIUM 25 MCG PO TABS: 25.0000 ug | ORAL_TABLET | Freq: Every day | ORAL | 1 refills | Status: DC

## 2020-03-12 ENCOUNTER — Other Ambulatory Visit: Payer: Self-pay | Admitting: Family Medicine

## 2020-03-12 LAB — URINE CULTURE

## 2020-03-12 MED ORDER — CIPROFLOXACIN HCL 500 MG PO TABS: 500.0000 mg | ORAL_TABLET | Freq: Two times a day (BID) | ORAL | 0 refills | Status: DC

## 2020-03-23 ENCOUNTER — Encounter: Payer: Self-pay | Admitting: Family Medicine

## 2020-03-28 ENCOUNTER — Other Ambulatory Visit: Payer: Self-pay

## 2020-03-28 ENCOUNTER — Encounter: Payer: Self-pay | Admitting: Family Medicine

## 2020-03-28 ENCOUNTER — Ambulatory Visit (INDEPENDENT_AMBULATORY_CARE_PROVIDER_SITE_OTHER): Payer: Medicaid Other | Admitting: Family Medicine

## 2020-03-28 VITALS — BP 160/83 | HR 64 | Temp 98.1°F | Wt 139.6 lb

## 2020-03-28 DIAGNOSIS — I129 Hypertensive chronic kidney disease with stage 1 through stage 4 chronic kidney disease, or unspecified chronic kidney disease: Secondary | ICD-10-CM

## 2020-03-28 DIAGNOSIS — N898 Other specified noninflammatory disorders of vagina: Secondary | ICD-10-CM

## 2020-03-28 DIAGNOSIS — E782 Mixed hyperlipidemia: Secondary | ICD-10-CM

## 2020-03-28 DIAGNOSIS — F332 Major depressive disorder, recurrent severe without psychotic features: Secondary | ICD-10-CM

## 2020-03-28 DIAGNOSIS — E039 Hypothyroidism, unspecified: Secondary | ICD-10-CM

## 2020-03-28 DIAGNOSIS — R8281 Pyuria: Secondary | ICD-10-CM

## 2020-03-28 LAB — URINALYSIS, ROUTINE W REFLEX MICROSCOPIC
Bilirubin, UA: NEGATIVE
Glucose, UA: NEGATIVE
Nitrite, UA: NEGATIVE
Specific Gravity, UA: 1.025 (ref 1.005–1.030)
Urobilinogen, Ur: 0.2 mg/dL (ref 0.2–1.0)
pH, UA: 5.5 (ref 5.0–7.5)

## 2020-03-28 LAB — MICROSCOPIC EXAMINATION

## 2020-03-28 LAB — WET PREP FOR TRICH, YEAST, CLUE
Clue Cell Exam: NEGATIVE
Trichomonas Exam: NEGATIVE
Yeast Exam: NEGATIVE

## 2020-03-28 MED ORDER — NITROFURANTOIN MONOHYD MACRO 100 MG PO CAPS: 100.0000 mg | ORAL_CAPSULE | Freq: Two times a day (BID) | ORAL | 0 refills | Status: AC

## 2020-03-28 MED ORDER — LISINOPRIL 20 MG PO TABS
20.0000 mg | ORAL_TABLET | Freq: Every day | ORAL | 3 refills | Status: DC
Start: 2020-03-28 — End: 2020-07-24

## 2020-03-28 NOTE — Patient Instructions (Signed)
Take 2 of your lisinopril 1x a day until you finish your current Rx, then get the new one and just take 1 a day

## 2020-03-28 NOTE — Assessment & Plan Note (Signed)
Tolerating medicine well. Continue to monitor. Recheck next visit.

## 2020-03-28 NOTE — Assessment & Plan Note (Signed)
Too early to recheck- will check next visit.

## 2020-03-28 NOTE — Progress Notes (Signed)
BP (!) 160/83   Pulse 64   Temp 98.1 F (36.7 C)   Wt 139 lb 9.6 oz (63.3 kg)   SpO2 97%   BMI 23.23 kg/m    Subjective:    Patient ID: Natalie Taylor, female    DOB: May 04, 1960, 59 y.o.   MRN: [id number]  HPI: Natalie Taylor is a 59 y.o. female  Chief Complaint  Patient presents with  . Hypertension  . Hyperlipidemia  . Depression  . Fall    pt fell 2 days ago on her elbow, has a large bruise on right arm   . bladder concern    pt states she is more consistanlty changing her pads due to more constant wetting, pt states she is having discharge, odor, and itchyness    HYPERTENSION / HYPERLIPIDEMIA Satisfied with current treatment? no Duration of hypertension: chronic BP monitoring frequency: not checking BP medication side effects: no Past BP meds: lisinopril, metoprolol Duration of hyperlipidemia: chronic Cholesterol medication side effects: no Cholesterol supplements: none Past cholesterol medications: atorvastatin Medication compliance: excellent compliance Aspirin: no Recent stressors: yes Recurrent headaches: no Visual changes: no Palpitations: no Dyspnea: no Chest pain: no Lower extremity edema: no Dizzy/lightheaded: no  DEPRESSION- her dog is dying, which has added to her stress.  Mood status: better Satisfied with current treatment?: no Symptom severity: moderate  Duration of current treatment : 3 weeks Side effects: no Medication compliance: excellent compliance Psychotherapy/counseling: no  Previous psychiatric medications: wellbutrin Depressed mood: yes Anxious mood: yes Anhedonia: no Significant weight loss or gain: no Insomnia: yes hard to fall asleep Fatigue: yes Feelings of worthlessness or guilt: yes Impaired concentration/indecisiveness: yes Suicidal ideations: no Hopelessness: no Crying spells: yes Depression screen [facility] 2/9 03/28/2020 03/07/2020 06/24/2019 01/04/2019 12/13/2018  Decreased Interest 3 3 1 1 3   Down, Depressed, Hopeless  0 3 0 2 3  PHQ - 2 Score 3 6 1 3 6   Altered sleeping 3 3 0 2 2  Tired, decreased energy 1 3 0 1 3  Change in appetite 3 3 0 0 2  Feeling bad or failure about yourself  3 3 0 1 3  Trouble concentrating 1 3 0 2 3  Moving slowly or fidgety/restless 1 3 0 3 3  Suicidal thoughts 3 3 0 0 3  PHQ-9 Score 18 27 1 12 25   Difficult doing work/chores Extremely dIfficult Extremely dIfficult - Very difficult Extremely dIfficult  Some recent data might be hidden   URINARY SYMPTOMS Duration: 3-4 days Dysuria: yes Urinary frequency: yes Urgency: yes Small volume voids: yes Symptom severity: mild Urinary incontinence: yes Foul odor: yes Hematuria: no Abdominal pain: no Back pain: no Suprapubic pain/pressure: yes Flank pain: no Fever:  no Vomiting: no Relief with cranberry juice: no Relief with pyridium: no Status: worse Previous urinary tract infection: yes Recurrent urinary tract infection: no Sexual activity:monogamous History of sexually transmitted disease: no Vaginal discharge: yes Treatments attempted: increasing fluids    Relevant past medical, surgical, family and social history reviewed and updated as indicated. Interim medical history since our last visit reviewed. Allergies and medications reviewed and updated.  Review of Systems  Constitutional: Negative.   Respiratory: Negative.   Cardiovascular: Negative.   Gastrointestinal: Negative.   Genitourinary: Positive for dysuria, frequency, urgency and vaginal discharge. Negative for decreased urine volume, difficulty urinating, dyspareunia, enuresis, flank pain, genital sores, hematuria, menstrual problem, pelvic pain, vaginal bleeding and vaginal pain.  Musculoskeletal: Negative.   Psychiatric/Behavioral: Positive for dysphoric mood. Negative  for agitation, behavioral problems, confusion, decreased concentration, hallucinations, self-injury, sleep disturbance and suicidal ideas. The patient is nervous/anxious. The patient is  not hyperactive.     Per HPI unless specifically indicated above     Objective:    BP (!) 160/83   Pulse 64   Temp 98.1 F (36.7 C)   Wt 139 lb 9.6 oz (63.3 kg)   SpO2 97%   BMI 23.23 kg/m   Wt Readings from Last 3 Encounters:  03/28/20 139 lb 9.6 oz (63.3 kg)  03/07/20 142 lb 6.4 oz (64.6 kg)  01/30/20 147 lb (66.7 kg)    Physical Exam Vitals and nursing note reviewed.  Constitutional:      General: She is not in acute distress.    Appearance: Normal appearance. She is not ill-appearing, toxic-appearing or diaphoretic.  HENT:     Head: Normocephalic and atraumatic.     Right Ear: External ear normal.     Left Ear: External ear normal.     Nose: Nose normal.     Mouth/Throat:     Mouth: Mucous membranes are moist.     Pharynx: Oropharynx is clear.  Eyes:     General: No scleral icterus.       Right eye: No discharge.        Left eye: No discharge.     Extraocular Movements: Extraocular movements intact.     Conjunctiva/sclera: Conjunctivae normal.     Pupils: Pupils are equal, round, and reactive to light.  Cardiovascular:     Rate and Rhythm: Normal rate and regular rhythm.     Pulses: Normal pulses.     Heart sounds: Normal heart sounds. No murmur heard.  No friction rub. No gallop.   Pulmonary:     Effort: Pulmonary effort is normal. No respiratory distress.     Breath sounds: Normal breath sounds. No stridor. No wheezing, rhonchi or rales.  Chest:     Chest wall: No tenderness.  Musculoskeletal:        General: Normal range of motion.     Cervical back: Normal range of motion and neck supple.  Skin:    General: Skin is warm and dry.     Capillary Refill: Capillary refill takes less than 2 seconds.     Coloration: Skin is not jaundiced or pale.     Findings: No bruising, erythema, lesion or rash.  Neurological:     General: No focal deficit present.     Mental Status: She is alert and oriented to person, place, and time. Mental status is at baseline.   Psychiatric:        Mood and Affect: Mood normal.        Behavior: Behavior normal.        Thought Content: Thought content normal.        Judgment: Judgment normal.     Results for orders placed or performed in visit on 03/07/20  Urine Culture   Specimen: Urine   UR  Result Value Ref Range   Urine Culture, Routine Final report (A)    Organism ID, Bacteria Proteus mirabilis (A)    Antimicrobial Susceptibility Comment   Microscopic Examination   Urine  Result Value Ref Range   WBC, UA 11-30 (A) 0 - 5 /hpf   RBC 3-10 (A) 0 - 2 /hpf   Epithelial Cells (non renal) 0-10 0 - 10 /hpf   Bacteria, UA Few (A) None seen/Few  CBC with Differential/Platelet  Result Value Ref  Range   WBC 8.7 3.4 - 10.8 x10E3/uL   RBC 4.61 3.77 - 5.28 x10E6/uL   Hemoglobin 13.0 11.1 - 15.9 g/dL   Hematocrit 01.0 93.2 - 46.6 %   MCV 88 79 - 97 fL   MCH 28.2 26.6 - 33.0 pg   MCHC 32.2 31 - 35 g/dL   RDW 35.5 73.2 - 20.2 %   Platelets 246 150 - 450 x10E3/uL   Neutrophils 68 Not Estab. %   Lymphs 21 Not Estab. %   Monocytes 8 Not Estab. %   Eos 2 Not Estab. %   Basos 1 Not Estab. %   Neutrophils Absolute 5.9 1.40 - 7.00 x10E3/uL   Lymphocytes Absolute 1.8 0 - 3 x10E3/uL   Monocytes Absolute 0.7 0 - 0 x10E3/uL   EOS (ABSOLUTE) 0.2 0.0 - 0.4 x10E3/uL   Basophils Absolute 0.1 0 - 0 x10E3/uL   Immature Granulocytes 0 Not Estab. %   Immature Grans (Abs) 0.0 0.0 - 0.1 x10E3/uL  Comprehensive metabolic panel  Result Value Ref Range   Glucose 87 65 - 99 mg/dL   BUN 10 6 - 24 mg/dL   Creatinine, Ser 5.42 0.57 - 1.00 mg/dL   GFR calc non Af Amer 64 >59 mL/min/1.73   GFR calc Af Amer 74 >59 mL/min/1.73   BUN/Creatinine Ratio 10 9 - 23   Sodium 142 134 - 144 mmol/L   Potassium 4.0 3.5 - 5.2 mmol/L   Chloride 106 96 - 106 mmol/L   CO2 23 20 - 29 mmol/L   Calcium 9.2 8.7 - 10.2 mg/dL   Total Protein 7.1 6.0 - 8.5 g/dL   Albumin 4.0 3.8 - 4.9 g/dL   Globulin, Total 3.1 1.5 - 4.5 g/dL   Albumin/Globulin  Ratio 1.3 1.2 - 2.2   Bilirubin Total 0.3 0.0 - 1.2 mg/dL   Alkaline Phosphatase 130 (H) 44 - 121 IU/L   AST 11 0 - 40 IU/L   ALT 8 0 - 32 IU/L  Lipid Panel w/o Chol/HDL Ratio  Result Value Ref Range   Cholesterol, Total 235 (H) 100 - 199 mg/dL   Triglycerides 706 0 - 149 mg/dL   HDL 50 >23 mg/dL   VLDL Cholesterol Cal 21 5 - 40 mg/dL   LDL Chol Calc (NIH) 762 (H) 0 - 99 mg/dL  Microalbumin, Urine Waived  Result Value Ref Range   Microalb, Ur Waived 80 (H) 0 - 19 mg/L   Creatinine, Urine Waived 200 10 - 300 mg/dL   Microalb/Creat Ratio 30-300 (H) <30 mg/g  TSH  Result Value Ref Range   TSH 5.320 (H) 0.450 - 4.500 uIU/mL  Urinalysis, Routine w reflex microscopic  Result Value Ref Range   Specific Gravity, UA 1.015 1.005 - 1.030   pH, UA 6.0 5.0 - 7.5   Color, UA Yellow Yellow   Appearance Ur Clear Clear   Leukocytes,UA 2+ (A) Negative   Protein,UA 1+ (A) Negative/Trace   Glucose, UA Negative Negative   Ketones, UA Negative Negative   RBC, UA Trace (A) Negative   Bilirubin, UA Negative Negative   Urobilinogen, Ur 0.2 0.2 - 1.0 mg/dL   Nitrite, UA Positive (A) Negative   Microscopic Examination See below:       Assessment & Plan:   Problem List Items Addressed This Visit      Endocrine   Hypothyroidism    Too early to recheck- will check next visit.         Genitourinary  Benign hypertensive renal disease - Primary    Still not under good control. Will increase her lisinopril to 20mg  and recheck 3 weeks. Call with any concerns.         Other   HLD (hyperlipidemia)    Tolerating medicine well. Continue to monitor. Recheck next visit.       Relevant Medications   lisinopril (ZESTRIL) 20 MG tablet   Depression    Doing better, just went up to the 300mg  3 days ago. Will continue current dose and recheck 3 weeks. Call with any concerns.        Other Visit Diagnoses    Pyuria       Will treat with nitrofurantoin. Await culture. Call with any concerns.     Relevant Orders   Urine Culture   Vaginal discharge       Wet prep negative. +UTI.    Relevant Orders   Urinalysis, Routine w reflex microscopic   WET PREP FOR TRICH, YEAST, CLUE       Follow up plan: Return in about 3 weeks (around 04/18/2020).

## 2020-03-28 NOTE — Assessment & Plan Note (Signed)
Doing better, just went up to the 300mg  3 days ago. Will continue current dose and recheck 3 weeks. Call with any concerns.

## 2020-03-28 NOTE — Assessment & Plan Note (Signed)
Still not under good control. Will increase her lisinopril to 20mg  and recheck 3 weeks. Call with any concerns.

## 2020-03-31 LAB — URINE CULTURE

## 2020-04-06 ENCOUNTER — Other Ambulatory Visit: Payer: Self-pay | Admitting: Family Medicine

## 2020-04-06 ENCOUNTER — Telehealth: Payer: Self-pay | Admitting: Licensed Clinical Social Worker

## 2020-04-06 ENCOUNTER — Telehealth: Payer: Medicaid Other

## 2020-04-06 NOTE — Telephone Encounter (Signed)
  Chronic Care Management    Clinical Social Work General Follow Up Note  04/06/2020 Name: Natalie Taylor MRN: [id number] DOB: [birth year]  Natalie Taylor is a 59 y.o. year old female who is a primary care patient of Dorcas Carrow, DO. The CCM team was consulted for assistance with Mental Health Counseling and Resources.   Review of patient status, including review of consultants reports, relevant laboratory and other test results, and collaboration with appropriate care team members and the patient's provider was performed as part of comprehensive patient evaluation and provision of chronic care management services.    LCSW completed CCM outreach attempt today but was unable to reach patient successfully. A HIPPA compliant voice message was left encouraging patient to return call once available. LCSW will ask Scheduling Care Guide to reschedule CCM SW appointment with patient as well.  Outpatient Encounter Medications as of 04/06/2020  Medication Sig Note  . albuterol (VENTOLIN HFA) 108 (90 Base) MCG/ACT inhaler Inhale 2 puffs into the lungs 2 (two) times daily.   Marland Kitchen atorvastatin (LIPITOR) 80 MG tablet Take 1 tablet (80 mg total) by mouth daily at 6 PM.   . budesonide-formoterol (SYMBICORT) 160-4.5 MCG/ACT inhaler Inhale 2 puffs into the lungs 2 (two) times daily.   Marland Kitchen buPROPion (WELLBUTRIN XL) 150 MG 24 hr tablet Take 1 tablet (150 mg total) by mouth daily. For 3 weeks, then increase to 2 tabs daily   . cyclobenzaprine (FLEXERIL) 5 MG tablet Take 1 tablet (5 mg total) by mouth at bedtime.   Marland Kitchen ibuprofen (ADVIL) 600 MG tablet Take 1 tablet (600 mg total) by mouth every 8 (eight) hours as needed.   Marland Kitchen ibuprofen (ADVIL) 800 MG tablet Take 1 tablet (800 mg total) by mouth every 8 (eight) hours as needed.   Marland Kitchen levothyroxine (SYNTHROID) 25 MCG tablet Take 1 tablet (25 mcg total) by mouth daily before breakfast.   . lisinopril (ZESTRIL) 20 MG tablet Take 1 tablet (20 mg total) by mouth daily.   .  metoprolol tartrate (LOPRESSOR) 25 MG tablet Take 1 tablet (25 mg total) by mouth 2 (two) times daily.   . nitrofurantoin, macrocrystal-monohydrate, (MACROBID) 100 MG capsule Take 1 capsule (100 mg total) by mouth 2 (two) times daily.   Marland Kitchen omeprazole (PRILOSEC) 20 MG capsule Take 1 capsule (20 mg total) by mouth 2 (two) times daily before a meal.   . ondansetron (ZOFRAN-ODT) 4 MG disintegrating tablet Take 1 tablet (4 mg total) by mouth every 8 (eight) hours as needed for nausea or vomiting. 06/03/2019: Taking QAM  . ticagrelor (BRILINTA) 90 MG TABS tablet Take 1 tablet (90 mg total) by mouth 2 (two) times daily.   Marland Kitchen triamcinolone ointment (KENALOG) 0.5 % Apply 1 application topically 2 (two) times daily.   . valACYclovir (VALTREX) 1000 MG tablet Take 1 tablet (1,000 mg total) by mouth 2 (two) times daily.    No facility-administered encounter medications on file as of 04/06/2020.    Follow Up Plan: Scheduling Care Guide will reach out to patient to reschedule appointment.   Dickie La, BSW, MSW, LCSW [facility] Care Management [place]  [facility] [email or web address] Phone: [phone]

## 2020-04-06 NOTE — Telephone Encounter (Signed)
Requested medication (s) are due for refill today:   Yes   Requested medication (s) are on the active medication list:  Yes  Future visit scheduled:   Yes in 2 wks   Last ordered: Synthroid 03/09/2020 #30, 1 refill;   Trazodone 09/05/2019 #60, 0 refills; Zofran is non delegated 04/12/2019 #30, 6 refills.   Requested Prescriptions  Pending Prescriptions Disp Refills   levothyroxine (SYNTHROID) 25 MCG tablet [Pharmacy Med Name: LEVOTHYROXINE SODIUM 25 MCG TAB] 30 tablet 1    Sig: TAKE 1 TABLET BY MOUTH ONCE DAILY ON AN EMPTY STOMACH. WAIT 30 MINUTES BEFORE TAKING OTHER MEDS.      Endocrinology:  Hypothyroid Agents Failed - 04/06/2020 12:55 PM      Failed - TSH needs to be rechecked within 3 months after an abnormal result. Refill until TSH is due.      Failed - TSH in normal range and within 360 days    TSH  Date Value Ref Range Status  03/07/2020 5.320 (H) 0.450 - 4.500 uIU/mL Final          Passed - Valid encounter within last 12 months    Recent Outpatient Visits           1 week ago Benign hypertensive renal disease   [facility] [place], Megan P, DO   1 month ago Benign hypertensive renal disease   [facility] [place], Megan P, DO   2 months ago Suspected COVID-19 virus infection   [facility], Megan P, DO   9 months ago Coronary artery disease involving native coronary artery of native heart without angina pectoris   [facility] [place], Megan P, DO   1 year ago Benign hypertensive renal disease   [facility] [place], Megan P, DO       Future Appointments             In 2 weeks Johnson, Megan P, DO [facility], [facility]               ondansetron (ZOFRAN-ODT) 4 MG disintegrating tablet [Pharmacy Med Name: ONDANSETRON 4 MG ODT] 30 tablet 6    Sig: TAKE 1 TABLET UNDER THE TONGUE EVERY 8 HOURS AS NEEDED FOR NAUSEA OR VOMITING      Not Delegated - Gastroenterology: Antiemetics  Failed - 04/06/2020 12:55 PM      Failed - This refill cannot be delegated      Passed - Valid encounter within last 6 months    Recent Outpatient Visits           1 week ago Benign hypertensive renal disease   [facility] [place], Megan P, DO   1 month ago Benign hypertensive renal disease   [facility] [place], Megan P, DO   2 months ago Suspected COVID-19 virus infection   [facility] [place], Megan P, DO   9 months ago Coronary artery disease involving native coronary artery of native heart without angina pectoris   [facility] [place], Megan P, DO   1 year ago Benign hypertensive renal disease   [facility] [place], Oralia Rud, DO       Future Appointments             In 2 weeks Laural Benes, Oralia Rud, DO [facility], [facility]               traZODone (DESYREL) 50 MG tablet [Pharmacy Med Name: TRAZODONE HCL 50 MG  TAB] 60 tablet     Sig: TAKE 1 OR 2 TABLETS BY MOUTH AT BEDTIME AS NEEDED FOR SLEEP      Psychiatry: Antidepressants - Serotonin Modulator Passed - 04/06/2020 12:55 PM      Passed - Completed PHQ-2 or PHQ-9 in the last 360 days      Passed - Valid encounter within last 6 months    Recent Outpatient Visits           1 week ago Benign hypertensive renal disease   [facility] [place], Megan P, DO   1 month ago Benign hypertensive renal disease   [facility] [place], Megan P, DO   2 months ago Suspected COVID-19 virus infection   [facility] [place], Megan P, DO   9 months ago Coronary artery disease involving native coronary artery of native heart without angina pectoris   [facility] [place], Megan P, DO   1 year ago Benign hypertensive renal disease   [facility] Dorcas Carrow, DO       Future Appointments             In 2 weeks Laural Benes, Oralia Rud, DO [facility], [facility]

## 2020-04-12 NOTE — Telephone Encounter (Signed)
Pt has been r/s  

## 2020-04-23 ENCOUNTER — Ambulatory Visit: Payer: Medicaid Other | Admitting: Family Medicine

## 2020-05-04 ENCOUNTER — Ambulatory Visit: Payer: Medicaid Other | Admitting: Family Medicine

## 2020-05-09 ENCOUNTER — Telehealth: Payer: Self-pay | Admitting: Licensed Clinical Social Worker

## 2020-05-09 ENCOUNTER — Telehealth: Payer: Medicaid Other

## 2020-05-09 IMAGING — CR DG HIP (WITH OR WITHOUT PELVIS) 2-3V*R*
1 series · 3 of 3 positions shown · non-contrast
Comparison: None.

CLINICAL DATA: Chronic bilateral hip pain without known injury.

EXAM:
DG HIP (WITH OR WITHOUT PELVIS) 2-3V RIGHT

[Series 1: dg hip unilat w or w/o pelvis 2-3 views  · non-contrast · 0.14mm/px · 3 of 3 slices shown]
[im 1/3]
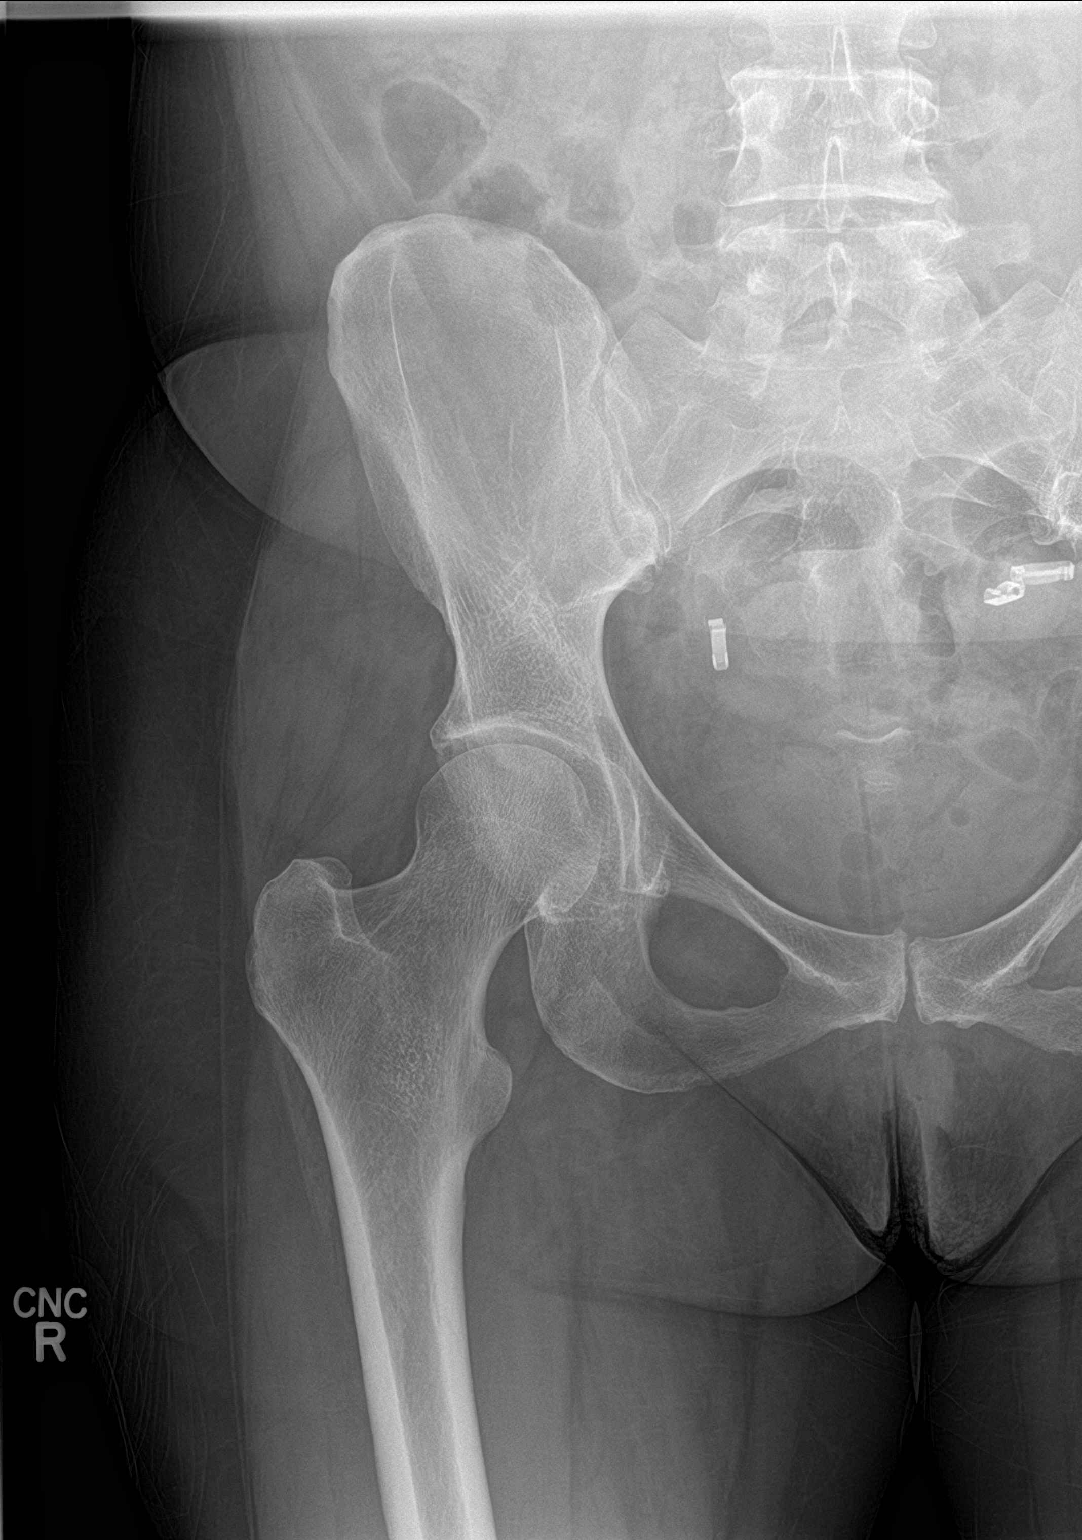
[im 2/3]
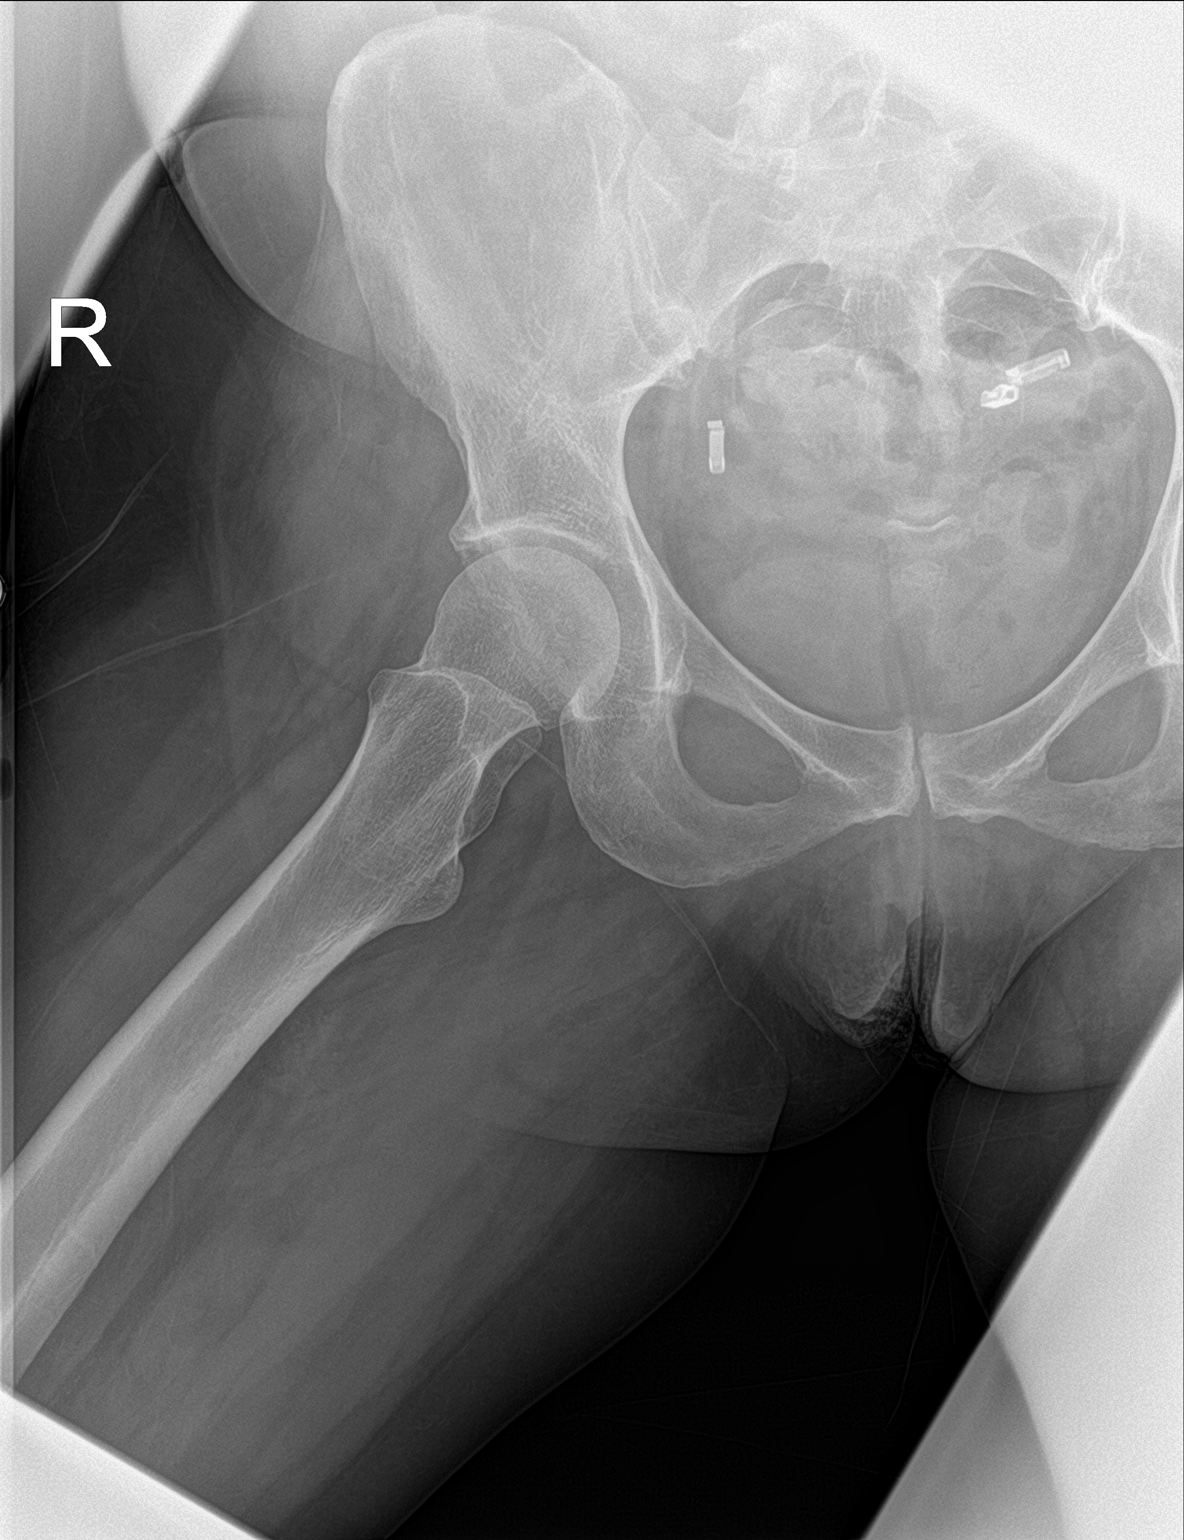
[im 3/3]
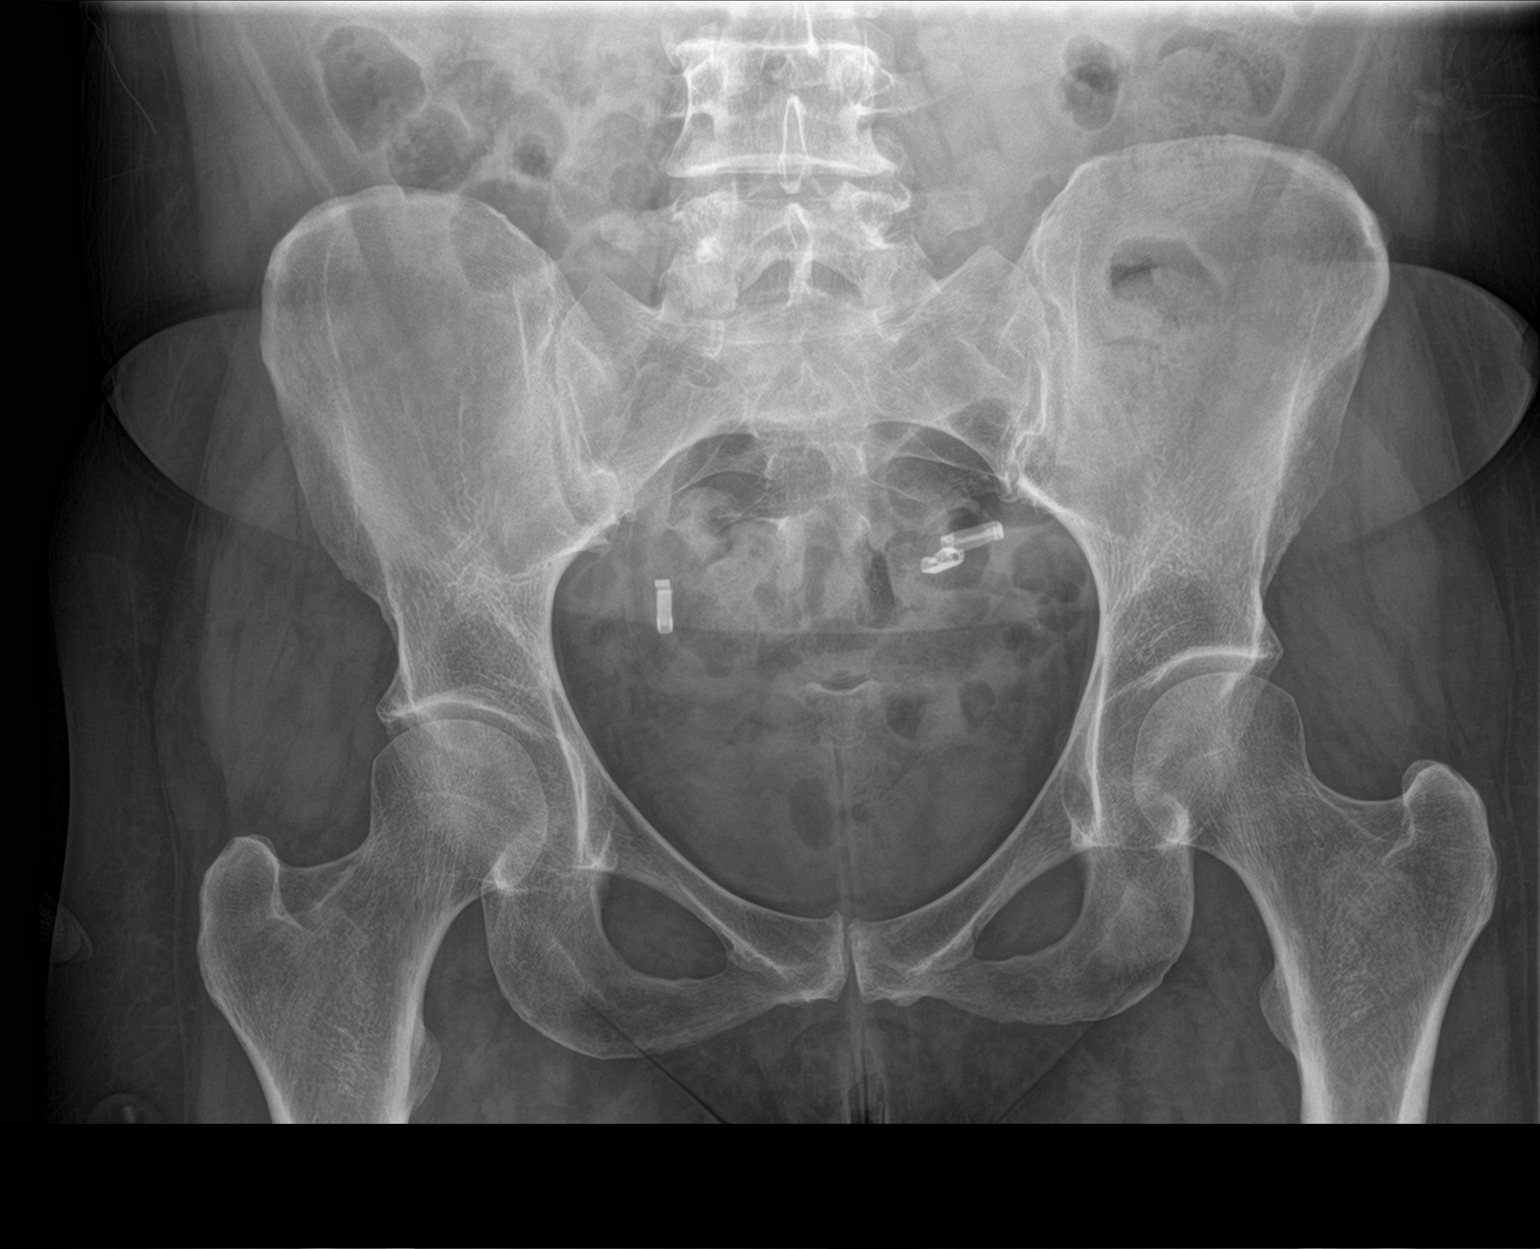

[3 of 3 positions shown; findings below may reference images not displayed]

FINDINGS: There is no evidence of hip fracture or dislocation. There is no
evidence of arthropathy or other focal bone abnormality.
IMPRESSION: Negative.

## 2020-05-09 NOTE — Telephone Encounter (Signed)
  Chronic Care Management    Clinical Social Work General Follow Up Note  05/09/2020 Name: Natalie Taylor MRN: [id number] DOB: 07/14/1960  Natalie Taylor is a 60 y.o. year old female who is a primary care patient of Dorcas Carrow, DO. The CCM team was consulted for assistance with Mental Health Counseling and Resources.   Review of patient status, including review of consultants reports, relevant laboratory and other test results, and collaboration with appropriate care team members and the patient's provider was performed as part of comprehensive patient evaluation and provision of chronic care management services.    LCSW completed CCM outreach attempt today but was unable to reach patient successfully. A HIPPA compliant voice message was left encouraging patient to return call once available. LCSW will ask Scheduling Care Guide to reschedule CCM SW appointment with patient as well.  Outpatient Encounter Medications as of 05/09/2020  Medication Sig  . albuterol (VENTOLIN HFA) 108 (90 Base) MCG/ACT inhaler Inhale 2 puffs into the lungs 2 (two) times daily.  Marland Kitchen atorvastatin (LIPITOR) 80 MG tablet Take 1 tablet (80 mg total) by mouth daily at 6 PM.  . budesonide-formoterol (SYMBICORT) 160-4.5 MCG/ACT inhaler Inhale 2 puffs into the lungs 2 (two) times daily.  Marland Kitchen buPROPion (WELLBUTRIN XL) 150 MG 24 hr tablet Take 1 tablet (150 mg total) by mouth daily. For 3 weeks, then increase to 2 tabs daily  . cyclobenzaprine (FLEXERIL) 5 MG tablet Take 1 tablet (5 mg total) by mouth at bedtime.  Marland Kitchen ibuprofen (ADVIL) 600 MG tablet Take 1 tablet (600 mg total) by mouth every 8 (eight) hours as needed.  Marland Kitchen ibuprofen (ADVIL) 800 MG tablet Take 1 tablet (800 mg total) by mouth every 8 (eight) hours as needed.  Marland Kitchen levothyroxine (SYNTHROID) 25 MCG tablet TAKE 1 TABLET BY MOUTH ONCE DAILY ON AN EMPTY STOMACH. WAIT 30 MINUTES BEFORE TAKING OTHER MEDS.  Marland Kitchen lisinopril (ZESTRIL) 20 MG tablet Take 1 tablet (20 mg total) by  mouth daily.  . metoprolol tartrate (LOPRESSOR) 25 MG tablet Take 1 tablet (25 mg total) by mouth 2 (two) times daily.  . nitrofurantoin, macrocrystal-monohydrate, (MACROBID) 100 MG capsule Take 1 capsule (100 mg total) by mouth 2 (two) times daily.  Marland Kitchen omeprazole (PRILOSEC) 20 MG capsule Take 1 capsule (20 mg total) by mouth 2 (two) times daily before a meal.  . ondansetron (ZOFRAN-ODT) 4 MG disintegrating tablet TAKE 1 TABLET UNDER THE TONGUE EVERY 8 HOURS AS NEEDED FOR NAUSEA OR VOMITING  . ticagrelor (BRILINTA) 90 MG TABS tablet Take 1 tablet (90 mg total) by mouth 2 (two) times daily.  . traZODone (DESYREL) 50 MG tablet TAKE 1 OR 2 TABLETS BY MOUTH AT BEDTIME AS NEEDED FOR SLEEP  . triamcinolone ointment (KENALOG) 0.5 % Apply 1 application topically 2 (two) times daily.  . valACYclovir (VALTREX) 1000 MG tablet Take 1 tablet (1,000 mg total) by mouth 2 (two) times daily.   No facility-administered encounter medications on file as of 05/09/2020.    Follow Up Plan: Scheduling Care Guide will reach out to patient to reschedule appointment.   Dickie La, BSW, MSW, LCSW [facility] Care Management [place]  [facility] [email or web address] Phone: [phone]

## 2020-05-14 ENCOUNTER — Telehealth: Payer: Self-pay

## 2020-05-14 NOTE — Chronic Care Management (AMB) (Signed)
°  Care Management   Note  05/14/2020 Name: [name] MRN: [id number] DOB: Nov 11, 1960  Natalie Taylor is a 60 y.o. year old female who is a primary care patient of Dorcas Carrow, DO and is actively engaged with the care management team. I reached out to Maryfrances Bunnell by phone today to assist with re-scheduling a follow up visit with the Licensed Clinical Social Worker  Follow up plan: Unsuccessful telephone outreach attempt made. A HIPAA compliant phone message was left for the patient providing contact information and requesting a return call.  The care management team will reach out to the patient again over the next 7 days.  If patient returns call to provider office, please advise to call Embedded Care Management Care Guide Penne Lash  at [phone]  Penne Lash, RMA Care Guide, Embedded Care Coordination [facility]  [place], [place] [address] Direct Dial: [phone] [email or web address] Website: [email or web address]

## 2020-05-25 NOTE — Chronic Care Management (AMB) (Signed)
  Care Management   Note  05/25/2020 Name: Natalie Taylor MRN: [id number] DOB: 13-Jul-1960  Natalie Taylor is a 60 y.o. year old female who is a primary care patient of Dorcas Carrow, DO and is actively engaged with the care management team. I reached out to Maryfrances Bunnell by phone today to assist with re-scheduling an initial visit with the Licensed Clinical Social Worker  Follow up plan: Telephone appointment with care management team member scheduled for:05/28/2020  Penne Lash, RMA Care Guide, Embedded Care Coordination [facility]  [place], [place] [address] Direct Dial: [phone] [email or web address] Website: [email or web address]

## 2020-05-25 NOTE — Telephone Encounter (Signed)
Patient has been rescheduled.

## 2020-05-28 ENCOUNTER — Ambulatory Visit: Payer: Medicaid Other | Admitting: Licensed Clinical Social Worker

## 2020-05-28 NOTE — Chronic Care Management (AMB) (Signed)
  Care Management   Follow Up Note   05/28/2020 Name: Natalie Taylor MRN: [id number] DOB: 04/04/1961  Referred by: Dorcas Carrow, DO Reason for referral : No chief complaint on file.   Natalie Taylor is a 60 y.o. year old female who is a primary care patient of Dorcas Carrow, DO. The care management team was consulted for assistance with care management and care coordination needs.    Review of patient status, including review of consultants reports, relevant laboratory and other test results, and collaboration with appropriate care team members and the patient's provider was performed as part of comprehensive patient evaluation and provision of chronic care management services.    LCSW completed third and final CCM outreach attempt today on 05/28/20 but was unable to reach patient successfully. A HIPPA compliant voice message was left encouraging patient to return call once available if she is still interested in CCM services. LCSW will close CCM referral at this time and will update PCP by having her cosign this encounter.   LCSW has been unable to make contact with the patient for follow up. The care management team is available to follow up with the patient after provider conversation with the patient regarding recommendation for care management engagement and subsequent re-referral to the care management team.   A HIPPA compliant phone message was left for the patient providing contact information and requesting a return call.   Dickie La, BSW, MSW, LCSW [facility] Care Management [place]  [facility] [email or web address] Phone: [phone]

## 2020-05-30 DIAGNOSIS — F331 Major depressive disorder, recurrent, moderate: Secondary | ICD-10-CM | POA: Diagnosis not present

## 2020-06-02 ENCOUNTER — Other Ambulatory Visit: Payer: Self-pay | Admitting: Family Medicine

## 2020-06-02 NOTE — Telephone Encounter (Signed)
Requested medication (s) are due for refill today:   Provider to determine  Requested medication (s) are on the active medication list:   Yes  Future visit scheduled:   No  Seen 2 mo. ago   Last ordered: 03/07/2020 #60, 0 refills  Clinic note:  Returned because this is a non delegated refill per protocol   Requested Prescriptions  Pending Prescriptions Disp Refills   BRILINTA 90 MG TABS tablet [Pharmacy Med Name: BRILINTA 90 MG TAB] 60 tablet 0    Sig: TAKE 1 TABLET BY MOUTH TWICE DAILY      Not Delegated - Hematology: Antiplatelets - ticagrelor Failed - 06/02/2020 12:24 PM      Failed - This refill cannot be delegated      Passed - Cr in normal range and within 180 days    Creatinine, Ser  Date Value Ref Range Status  03/07/2020 0.98 0.57 - 1.00 mg/dL Final   Creatinine,U  Date Value Ref Range Status  09/21/2009  mg/dL Final   185.6 (NOTE)  Cutoff Values for Urine Drug Screen:        Drug Class           Cutoff (ng/mL)        Amphetamines            1000        Barbiturates             200        Cocaine Metabolites      300        Benzodiazepines          200        Methadone                 300        Opiates                 2000        Phencyclidine             25        Propoxyphene             300        Marijuana Metabolites     50  For medical purposes only.          Passed - HCT in normal range and within 180 days    Hematocrit  Date Value Ref Range Status  03/07/2020 40.4 34.0 - 46.6 % Final          Passed - HGB in normal range and within 180 days    Hemoglobin  Date Value Ref Range Status  03/07/2020 13.0 11.1 - 15.9 g/dL Final          Passed - PLT in normal range and within 180 days    Platelets  Date Value Ref Range Status  03/07/2020 246 150 - 450 x10E3/uL Final          Passed - Valid encounter within last 6 months    Recent Outpatient Visits           2 months ago Benign hypertensive renal disease   [facility] [place], Megan  P, DO   2 months ago Benign hypertensive renal disease   [facility] [place], Megan P, DO   4 months ago Suspected COVID-19 virus infection   [facility] [place], [place], DO   11 months ago Coronary artery disease involving native coronary artery  of native heart without angina pectoris   [facility] [place], [place] P, DO   1 year ago Benign hypertensive renal disease   [facility] [place], [place], DO

## 2020-06-10 NOTE — Telephone Encounter (Signed)
Supposed to be getting this from her cardiologist. Needs to follow up with her cardiologist.

## 2020-07-24 ENCOUNTER — Other Ambulatory Visit: Payer: Self-pay | Admitting: Nurse Practitioner

## 2020-07-24 ENCOUNTER — Other Ambulatory Visit: Payer: Self-pay | Admitting: Family Medicine

## 2020-07-24 NOTE — Telephone Encounter (Signed)
Requested medication (s) are due for refill today: no  Requested medication (s) are on the active medication list: yes   Future visit scheduled: yes  Notes to clinic:Patient no showed last appointment  Review for refill    Requested Prescriptions  Pending Prescriptions Disp Refills   metoprolol tartrate (LOPRESSOR) 25 MG tablet [Pharmacy Med Name: METOPROLOL TARTRATE 25 MG TAB] 60 tablet 3    Sig: TAKE 1 TABLET BY MOUTH TWICE DAILY      Cardiovascular:  Beta Blockers Failed - 07/24/2020  1:28 PM      Failed - Last BP in normal range    BP Readings from Last 1 Encounters:  03/28/20 (!) 160/83          Passed - Last Heart Rate in normal range    Pulse Readings from Last 1 Encounters:  03/28/20 64          Passed - Valid encounter within last 6 months    Recent Outpatient Visits           3 months ago Benign hypertensive renal disease   [facility] [place], Megan P, DO   4 months ago Benign hypertensive renal disease   [facility] [place], Megan P, DO   5 months ago Suspected COVID-19 virus infection   [facility] [place], [place], DO   1 year ago Coronary artery disease involving native coronary artery of native heart without angina pectoris   [facility] [place], Megan P, DO   1 year ago Benign hypertensive renal disease   [facility] [place], Megan P, DO                  cyclobenzaprine (FLEXERIL) 5 MG tablet [Pharmacy Med Name: CYCLOBENZAPRINE HCL 5 MG TAB] 30 tablet 2    Sig: TAKE 1 TABLET BY MOUTH AT BEDTIME      Not Delegated - Analgesics:  Muscle Relaxants Failed - 07/24/2020  1:28 PM      Failed - This refill cannot be delegated      Passed - Valid encounter within last 6 months    Recent Outpatient Visits           3 months ago Benign hypertensive renal disease   [facility] [place], Megan P, DO   4 months ago Benign hypertensive renal disease   [facility]  [place], Megan P, DO   5 months ago Suspected COVID-19 virus infection   [facility] [place], [place], DO   1 year ago Coronary artery disease involving native coronary artery of native heart without angina pectoris   [facility] [place], Megan P, DO   1 year ago Benign hypertensive renal disease   [facility] [place], Megan P, DO                  lisinopril (ZESTRIL) 20 MG tablet [Pharmacy Med Name: LISINOPRIL 20 MG TAB] 30 tablet 3    Sig: TAKE 1 TABLET BY MOUTH ONCE DAILY      Cardiovascular:  ACE Inhibitors Failed - 07/24/2020  1:28 PM      Failed - Last BP in normal range    BP Readings from Last 1 Encounters:  03/28/20 (!) 160/83          Passed - Cr in normal range and within 180 days    Creatinine, Ser  Date Value Ref Range Status  03/07/2020 0.98 0.57 - 1.00 mg/dL Final  Creatinine,U  Date Value Ref Range Status  09/21/2009  mg/dL Final   740.8 (NOTE)  Cutoff Values for Urine Drug Screen:        Drug Class           Cutoff (ng/mL)        Amphetamines            1000        Barbiturates             200        Cocaine Metabolites      300        Benzodiazepines          200        Methadone                 300        Opiates                 2000        Phencyclidine             25        Propoxyphene             300        Marijuana Metabolites     50  For medical purposes only.          Passed - K in normal range and within 180 days    Potassium  Date Value Ref Range Status  03/07/2020 4.0 3.5 - 5.2 mmol/L Final          Passed - Patient is not pregnant      Passed - Valid encounter within last 6 months    Recent Outpatient Visits           3 months ago Benign hypertensive renal disease   [facility] [place], Megan P, DO   4 months ago Benign hypertensive renal disease   [facility] [place], Megan P, DO   5 months ago Suspected COVID-19 virus infection   [facility] [place],  [place], DO   1 year ago Coronary artery disease involving native coronary artery of native heart without angina pectoris   [facility] [place], Megan P, DO   1 year ago Benign hypertensive renal disease   [facility] [place], Megan P, DO

## 2020-07-24 NOTE — Telephone Encounter (Signed)
Called pt advised rx has been sent  

## 2020-07-24 NOTE — Telephone Encounter (Signed)
appt

## 2020-07-24 NOTE — Telephone Encounter (Signed)
Called pt scheduled for 4/22 wanting to know if she can get a refill until then doesn't have transportation. Please advise.  

## 2020-07-24 NOTE — Telephone Encounter (Signed)
Routing to provider  

## 2020-07-24 NOTE — Telephone Encounter (Signed)
Requested medication (s) are due for refill today: yes  Requested medication (s) are on the active medication list: yes  Last refill:  04/06/20  Future visit scheduled: no  Notes to clinic:  overdue lab work   Requested Prescriptions  Pending Prescriptions Disp Refills   levothyroxine (SYNTHROID) 25 MCG tablet [Pharmacy Med Name: LEVOTHYROXINE SODIUM 25 MCG TAB] 30 tablet 1    Sig: TAKE 1 TABLET BY MOUTH ONCE DAILY ON AN EMPTY STOMACH. WAIT 30 MINUTES BEFORE TAKING OTHER MEDS.      Endocrinology:  Hypothyroid Agents Failed - 07/24/2020  1:27 PM      Failed - TSH needs to be rechecked within 3 months after an abnormal result. Refill until TSH is due.      Failed - TSH in normal range and within 360 days    TSH  Date Value Ref Range Status  03/07/2020 5.320 (H) 0.450 - 4.500 uIU/mL Final          Passed - Valid encounter within last 12 months    Recent Outpatient Visits           3 months ago Benign hypertensive renal disease   [facility] [place], Megan P, DO   4 months ago Benign hypertensive renal disease   [facility] [place], Megan P, DO   5 months ago Suspected COVID-19 virus infection   [facility] [place], [place]wyn[place], DO   1 year ago Coronary artery disease involving native coronary artery of native heart without angina pectoris   [facility] [place], Megan P, DO   1 year ago Benign hypertensive renal disease   [facility] [place], Megan P, DO

## 2020-07-24 NOTE — Telephone Encounter (Signed)
Called pt scheduled for 4/22 wanting to know if she can get a refill until then doesn't have transportation. Please advise.

## 2020-08-10 ENCOUNTER — Ambulatory Visit: Payer: Medicaid Other | Admitting: Family Medicine

## 2020-08-24 ENCOUNTER — Telehealth: Payer: Self-pay

## 2020-08-24 NOTE — Telephone Encounter (Signed)
For information only.

## 2020-08-24 NOTE — Telephone Encounter (Signed)
Fermin Schwab for the [employer]  Would like to see if Dr.Johson will sign on death certificate and has questions in regards to patients health SGt Sharon Seller can be reached at [phone].

## 2020-09-19 DEATH — deceased
